# Patient Record
Sex: Female | Born: 1935
Health system: Southern US, Community
[De-identification: ages and names within clinical notes are randomized; demographics above are authoritative.]

## PROBLEM LIST (undated history)

## (undated) DIAGNOSIS — R011 Cardiac murmur, unspecified: Secondary | ICD-10-CM

## (undated) DIAGNOSIS — K5792 Diverticulitis of intestine, part unspecified, without perforation or abscess without bleeding: Secondary | ICD-10-CM

## (undated) DIAGNOSIS — H409 Unspecified glaucoma: Secondary | ICD-10-CM

## (undated) DIAGNOSIS — M810 Age-related osteoporosis without current pathological fracture: Secondary | ICD-10-CM

## (undated) DIAGNOSIS — I10 Essential (primary) hypertension: Secondary | ICD-10-CM

## (undated) DIAGNOSIS — E559 Vitamin D deficiency, unspecified: Secondary | ICD-10-CM

## (undated) DIAGNOSIS — M1990 Unspecified osteoarthritis, unspecified site: Secondary | ICD-10-CM

## (undated) HISTORY — DX: Unspecified osteoarthritis, unspecified site: M19.90

## (undated) HISTORY — DX: Vitamin D deficiency, unspecified: E55.9

## (undated) HISTORY — DX: Essential (primary) hypertension: I10

## (undated) HISTORY — DX: Cardiac murmur, unspecified: R01.1

## (undated) HISTORY — DX: Diverticulitis of intestine, part unspecified, without perforation or abscess without bleeding: K57.92

## (undated) HISTORY — DX: Age-related osteoporosis without current pathological fracture: M81.0

## (undated) HISTORY — DX: Unspecified glaucoma: H40.9

---
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
## 2006-04-29 HISTORY — PX: ABDOMINAL HYSTERECTOMY: SHX81

## 2009-04-29 HISTORY — PX: OTHER SURGICAL HISTORY: SHX169

## 2011-05-17 DIAGNOSIS — I10 Essential (primary) hypertension: Secondary | ICD-10-CM | POA: Diagnosis not present

## 2011-05-30 DIAGNOSIS — H40019 Open angle with borderline findings, low risk, unspecified eye: Secondary | ICD-10-CM | POA: Diagnosis not present

## 2011-08-19 DIAGNOSIS — M1710 Unilateral primary osteoarthritis, unspecified knee: Secondary | ICD-10-CM | POA: Diagnosis not present

## 2011-09-19 DIAGNOSIS — H40019 Open angle with borderline findings, low risk, unspecified eye: Secondary | ICD-10-CM | POA: Diagnosis not present

## 2011-09-25 DIAGNOSIS — R922 Inconclusive mammogram: Secondary | ICD-10-CM | POA: Diagnosis not present

## 2011-09-25 DIAGNOSIS — Z1231 Encounter for screening mammogram for malignant neoplasm of breast: Secondary | ICD-10-CM | POA: Diagnosis not present

## 2011-10-09 DIAGNOSIS — I059 Rheumatic mitral valve disease, unspecified: Secondary | ICD-10-CM | POA: Diagnosis not present

## 2011-10-09 DIAGNOSIS — E7800 Pure hypercholesterolemia, unspecified: Secondary | ICD-10-CM | POA: Diagnosis not present

## 2011-10-09 DIAGNOSIS — I10 Essential (primary) hypertension: Secondary | ICD-10-CM | POA: Diagnosis not present

## 2011-10-09 DIAGNOSIS — Z0000 Encounter for general adult medical examination without abnormal findings: Secondary | ICD-10-CM | POA: Diagnosis not present

## 2012-01-13 DIAGNOSIS — H40019 Open angle with borderline findings, low risk, unspecified eye: Secondary | ICD-10-CM | POA: Diagnosis not present

## 2012-03-09 DIAGNOSIS — Z23 Encounter for immunization: Secondary | ICD-10-CM | POA: Diagnosis not present

## 2012-03-09 DIAGNOSIS — I10 Essential (primary) hypertension: Secondary | ICD-10-CM | POA: Diagnosis not present

## 2012-05-13 DIAGNOSIS — H409 Unspecified glaucoma: Secondary | ICD-10-CM | POA: Diagnosis not present

## 2012-05-13 DIAGNOSIS — H4011X0 Primary open-angle glaucoma, stage unspecified: Secondary | ICD-10-CM | POA: Diagnosis not present

## 2012-07-03 DIAGNOSIS — I8000 Phlebitis and thrombophlebitis of superficial vessels of unspecified lower extremity: Secondary | ICD-10-CM | POA: Diagnosis not present

## 2012-07-08 DIAGNOSIS — Z1283 Encounter for screening for malignant neoplasm of skin: Secondary | ICD-10-CM | POA: Diagnosis not present

## 2012-07-08 DIAGNOSIS — D485 Neoplasm of uncertain behavior of skin: Secondary | ICD-10-CM | POA: Diagnosis not present

## 2012-07-08 DIAGNOSIS — D239 Other benign neoplasm of skin, unspecified: Secondary | ICD-10-CM | POA: Diagnosis not present

## 2012-07-08 DIAGNOSIS — D2360 Other benign neoplasm of skin of unspecified upper limb, including shoulder: Secondary | ICD-10-CM | POA: Diagnosis not present

## 2012-08-21 DIAGNOSIS — H40019 Open angle with borderline findings, low risk, unspecified eye: Secondary | ICD-10-CM | POA: Diagnosis not present

## 2012-09-07 DIAGNOSIS — H905 Unspecified sensorineural hearing loss: Secondary | ICD-10-CM | POA: Diagnosis not present

## 2012-09-23 DIAGNOSIS — H905 Unspecified sensorineural hearing loss: Secondary | ICD-10-CM | POA: Diagnosis not present

## 2012-12-23 ENCOUNTER — Encounter: Payer: Self-pay | Admitting: Family Medicine

## 2012-12-23 ENCOUNTER — Ambulatory Visit (INDEPENDENT_AMBULATORY_CARE_PROVIDER_SITE_OTHER): Payer: Medicare Other | Admitting: Family Medicine

## 2012-12-23 VITALS — BP 138/84 | HR 68 | Temp 98.5°F | Ht 70.0 in | Wt 133.0 lb

## 2012-12-23 DIAGNOSIS — Z1239 Encounter for other screening for malignant neoplasm of breast: Secondary | ICD-10-CM

## 2012-12-23 DIAGNOSIS — K5732 Diverticulitis of large intestine without perforation or abscess without bleeding: Secondary | ICD-10-CM | POA: Diagnosis not present

## 2012-12-23 DIAGNOSIS — H409 Unspecified glaucoma: Secondary | ICD-10-CM

## 2012-12-23 DIAGNOSIS — K5792 Diverticulitis of intestine, part unspecified, without perforation or abscess without bleeding: Secondary | ICD-10-CM

## 2012-12-23 DIAGNOSIS — I10 Essential (primary) hypertension: Secondary | ICD-10-CM | POA: Diagnosis not present

## 2012-12-23 DIAGNOSIS — M1990 Unspecified osteoarthritis, unspecified site: Secondary | ICD-10-CM

## 2012-12-23 DIAGNOSIS — M129 Arthropathy, unspecified: Secondary | ICD-10-CM

## 2012-12-23 MED ORDER — ATENOLOL 25 MG PO TABS: 25.0000 mg | ORAL_TABLET | Freq: Every day | ORAL | Status: DC

## 2012-12-23 NOTE — Progress Notes (Signed)
Nature conservation officer at [facility] [address] [place] [address] Phone: [phone] Fax: [phone]  Date:  12/23/2012   Name:  Anne Ferrell   DOB:  Jun 05, 1932   MRN:  [id number] Gender: female Age: 79 y.o.  Primary Physician:  Hannah Beat, MD  Evaluating MD: Hannah Beat, MD   Chief Complaint: Establish Care   History of Present Illness:  Anne Ferrell is a 79 y.o. pleasant patient who presents with the following:  New patient:   HTN: 120's - last reading was normal. Initially elevated today in the office. She feels fine otherwise  Also has glaucoma O/w generally healthy  Never had colon.   Patient Active Problem List   Diagnosis Date Noted  . High blood pressure   . Glaucoma   . Diverticulitis   . Arthritis     Past Medical History  Diagnosis Date  . Arthritis   . Diverticulitis   . Glaucoma   . Heart murmur   . High blood pressure     Past Surgical History  Procedure Laterality Date  . Cataract surgery    . Tonsillectomy and adenoidectomy    . Abdominal hysterectomy      History   Social History  . Marital Status: Widowed    Spouse Name: N/A    Number of Children: N/A  . Years of Education: N/A   Occupational History  . Not on file.   Social History Main Topics  . Smoking status: Never Smoker   . Smokeless tobacco: Never Used  . Alcohol Use: Yes     Comment: red wine  . Drug Use: No  . Sexual Activity: Not on file   Other Topics Concern  . Not on file   Social History Narrative   Mother of Kristie Cowman    Family History  Problem Relation Age of Onset  . Cancer Father     prostate    No Known Allergies  No current outpatient prescriptions on file prior to visit.   No current facility-administered medications on file prior to visit.     Review of Systems:   GEN: No acute illnesses, no fevers, chills. GI: No n/v/d, eating normally Pulm: No SOB Interactive and getting along well at  home.  Otherwise, ROS is as per the HPI.   Physical Examination: BP 178/90  Pulse 68  Temp(Src) 98.5 F (36.9 C) (Oral)  Ht 5\' 10"  (1.778 m)  Wt 133 lb (60.328 kg)  BMI 19.08 kg/m2  Ideal Body Weight: Weight in (lb) to have BMI = 25: 173.9   GEN: WDWN, NAD, Non-toxic, A & O x 3 HEENT: Atraumatic, Normocephalic. Neck supple. No masses, No LAD. Ears and Nose: No external deformity. CV: RRR, No M/G/R. No JVD. No thrill. No extra heart sounds. PULM: CTA B, no wheezes, crackles, rhonchi. No retractions. No resp. distress. No accessory muscle use. EXTR: No c/c/e NEURO Normal gait.  PSYCH: Normally interactive. Conversant. Not depressed or anxious appearing.  Calm demeanor.    Assessment and Plan:  Screening for malignant neoplasm of breast - Plan: MM Digital Screening  High blood pressure  Glaucoma  Diverticulitis  Arthritis  Doing well, cpx later in the year  Orders Today:  Orders Placed This Encounter  Procedures  . MM Digital Screening    Standing Status: Future     Number of Occurrences:      Standing Expiration Date: 02/22/2014    Order Specific Question:  Preferred imaging location?  Answer:  External    Order Specific Question:  Reason for exam:    Answer:  [place], screening    Updated Medication List: (Includes new medications, updates to list, dose adjustments) Meds ordered this encounter  Medications  . DISCONTD: atenolol (TENORMIN) 25 MG tablet    Sig: Take 25 mg by mouth daily.  . timolol (BETIMOL) 0.5 % ophthalmic solution    Sig: Place 1 drop into the right eye daily.  . Calcium-Vitamin D-Vitamin K (VIACTIV PO)    Sig: Take by mouth daily.  . cholecalciferol (VITAMIN D) 400 UNITS TABS tablet    Sig: Take by mouth daily.  . magnesium oxide (MAG-OX) 400 MG tablet    Sig: Take 400 mg by mouth daily.  Marland Kitchen atenolol (TENORMIN) 25 MG tablet    Sig: Take 1 tablet (25 mg total) by mouth daily.    Dispense:  90 tablet    Refill:  3    Medications  Discontinued: Medications Discontinued During This Encounter  Medication Reason  . atenolol (TENORMIN) 25 MG tablet Reorder      Signed, Jeromiah Ohalloran T. Juwann Sherk, MD 12/23/2012 10:55 AM

## 2012-12-29 ENCOUNTER — Encounter: Payer: Self-pay | Admitting: Family Medicine

## 2012-12-29 DIAGNOSIS — H409 Unspecified glaucoma: Secondary | ICD-10-CM | POA: Insufficient documentation

## 2012-12-29 DIAGNOSIS — M1990 Unspecified osteoarthritis, unspecified site: Secondary | ICD-10-CM | POA: Insufficient documentation

## 2012-12-29 DIAGNOSIS — I10 Essential (primary) hypertension: Secondary | ICD-10-CM | POA: Insufficient documentation

## 2012-12-29 DIAGNOSIS — K5792 Diverticulitis of intestine, part unspecified, without perforation or abscess without bleeding: Secondary | ICD-10-CM | POA: Insufficient documentation

## 2013-01-08 ENCOUNTER — Encounter: Payer: Self-pay | Admitting: Family Medicine

## 2013-01-08 ENCOUNTER — Ambulatory Visit: Payer: Self-pay | Admitting: Family Medicine

## 2013-01-08 DIAGNOSIS — Z1231 Encounter for screening mammogram for malignant neoplasm of breast: Secondary | ICD-10-CM | POA: Diagnosis not present

## 2013-01-25 DIAGNOSIS — H4010X0 Unspecified open-angle glaucoma, stage unspecified: Secondary | ICD-10-CM | POA: Diagnosis not present

## 2013-03-01 DIAGNOSIS — H4010X0 Unspecified open-angle glaucoma, stage unspecified: Secondary | ICD-10-CM | POA: Diagnosis not present

## 2013-04-06 ENCOUNTER — Other Ambulatory Visit: Payer: Self-pay | Admitting: Family Medicine

## 2013-04-06 DIAGNOSIS — R5381 Other malaise: Secondary | ICD-10-CM

## 2013-04-06 DIAGNOSIS — Z13220 Encounter for screening for lipoid disorders: Secondary | ICD-10-CM

## 2013-04-06 DIAGNOSIS — Z79899 Other long term (current) drug therapy: Secondary | ICD-10-CM

## 2013-04-07 ENCOUNTER — Other Ambulatory Visit (INDEPENDENT_AMBULATORY_CARE_PROVIDER_SITE_OTHER): Payer: Medicare Other

## 2013-04-07 DIAGNOSIS — R5381 Other malaise: Secondary | ICD-10-CM

## 2013-04-07 DIAGNOSIS — Z79899 Other long term (current) drug therapy: Secondary | ICD-10-CM

## 2013-04-07 DIAGNOSIS — Z13220 Encounter for screening for lipoid disorders: Secondary | ICD-10-CM

## 2013-04-07 LAB — CBC WITH DIFFERENTIAL/PLATELET
Basophils Relative: 0.9 % (ref 0.0–3.0)
Eosinophils Relative: 2.3 % (ref 0.0–5.0)
HCT: 43.1 % (ref 36.0–46.0)
Hemoglobin: 14.5 g/dL (ref 12.0–15.0)
Lymphocytes Relative: 14.2 % (ref 12.0–46.0)
Lymphs Abs: 1 10*3/uL (ref 0.7–4.0)
Monocytes Absolute: 0.5 10*3/uL (ref 0.1–1.0)
Monocytes Relative: 7.9 % (ref 3.0–12.0)
Neutro Abs: 5.1 10*3/uL (ref 1.4–7.7)
Platelets: 286 10*3/uL (ref 150.0–400.0)
RBC: 4.58 Mil/uL (ref 3.87–5.11)
WBC: 6.8 10*3/uL (ref 4.5–10.5)

## 2013-04-07 LAB — HEPATIC FUNCTION PANEL
ALT: 22 U/L (ref 0–35)
AST: 28 U/L (ref 0–37)
Albumin: 4.1 g/dL (ref 3.5–5.2)
Alkaline Phosphatase: 63 U/L (ref 39–117)
Bilirubin, Direct: 0.1 mg/dL (ref 0.0–0.3)
Total Protein: 6.6 g/dL (ref 6.0–8.3)

## 2013-04-07 LAB — LIPID PANEL
Total CHOL/HDL Ratio: 3
Triglycerides: 72 mg/dL (ref 0.0–149.0)

## 2013-04-07 LAB — BASIC METABOLIC PANEL
BUN: 18 mg/dL (ref 6–23)
CO2: 28 mEq/L (ref 19–32)
Calcium: 9.1 mg/dL (ref 8.4–10.5)
Chloride: 104 mEq/L (ref 96–112)
Creatinine, Ser: 0.9 mg/dL (ref 0.4–1.2)
GFR: 63.42 mL/min (ref >60.00)

## 2013-04-07 LAB — TSH: TSH: 1.59 u[IU]/mL (ref 0.35–5.50)

## 2013-04-12 ENCOUNTER — Ambulatory Visit (INDEPENDENT_AMBULATORY_CARE_PROVIDER_SITE_OTHER): Payer: Medicare Other | Admitting: Family Medicine

## 2013-04-12 ENCOUNTER — Encounter: Payer: Self-pay | Admitting: Family Medicine

## 2013-04-12 ENCOUNTER — Encounter: Payer: Self-pay | Admitting: Internal Medicine

## 2013-04-12 VITALS — BP 150/84 | HR 60 | Temp 97.9°F | Ht 70.0 in | Wt 136.5 lb

## 2013-04-12 DIAGNOSIS — Z23 Encounter for immunization: Secondary | ICD-10-CM

## 2013-04-12 DIAGNOSIS — Z1211 Encounter for screening for malignant neoplasm of colon: Secondary | ICD-10-CM

## 2013-04-12 DIAGNOSIS — Z0000 Encounter for general adult medical examination without abnormal findings: Secondary | ICD-10-CM

## 2013-04-12 NOTE — Progress Notes (Signed)
Pre-visit discussion using our clinic review tool. No additional management support is needed unless otherwise documented below in the visit note.  

## 2013-04-12 NOTE — Progress Notes (Signed)
Date:  04/12/2013   Name:  Anne Ferrell   DOB:  1932-12-02   MRN:  [id number] Gender: female Age: 79 y.o.  Primary Physician:  Hannah Beat, MD   Chief Complaint: Medicare Wellness   Subjective:   History of Present Illness:  Anne Ferrell is a 79 y.o. pleasant patient who presents with the following:  Health Maintenance Summary Reviewed and updated, unless pt declines services.  Tobacco History Reviewed. Non-smoker Alcohol: No concerns, no excessive use Exercise Habits: Some activity, rec at least 30 mins 5 times a week STD concerns: none Drug Use: None Birth control method: n/a Menses regular: hyst Lumps or breast concerns: no  Health Maintenance  Topic Date Due  . Colonoscopy  06/30/1984  . Tetanus/tdap  04/13/2014  . Zostavax  04/13/2018  . Influenza Vaccine  11/27/2013  . Mammogram  01/08/2014  . Pneumococcal Polysaccharide Vaccine Age 36 And Over  Completed   Immunization History  Administered Date(s) Administered  . Influenza,inj,Quad PF,36+ Mos 04/12/2013  . Pneumococcal Polysaccharide-23 04/12/2013    Patient Active Problem List   Diagnosis Date Noted  . High blood pressure   . Glaucoma   . Diverticulitis   . Arthritis     Past Medical History  Diagnosis Date  . Arthritis   . Diverticulitis   . Glaucoma   . Heart murmur   . High blood pressure     Past Surgical History  Procedure Laterality Date  . Cataract surgery    . Tonsillectomy and adenoidectomy    . Abdominal hysterectomy      History   Social History  . Marital Status: Widowed    Spouse Name: N/A    Number of Children: N/A  . Years of Education: N/A   Occupational History  . Not on file.   Social History Main Topics  . Smoking status: Never Smoker   . Smokeless tobacco: Never Used  . Alcohol Use: Yes     Comment: red wine  . Drug Use: No  . Sexual Activity: Not on file   Other Topics Concern  . Not on file   Social History Narrative   Mother of  Anne Ferrell    Family History  Problem Relation Age of Onset  . Cancer Father     prostate    Allergies  Allergen Reactions  . Anise Flavor Other (See Comments)    Feels like she is going to pass out  . Licorice Flavor [Flavoring Agent] Palpitations    Medication list has been reviewed and updated.  Review of Systems:   General: Denies fever, chills, sweats. No significant weight loss. Eyes: Denies blurring,significant itching ENT: Denies earache, sore throat, and hoarseness.  Cardiovascular: Denies chest pains, palpitations, dyspnea on exertion,  Respiratory: Denies cough, dyspnea at rest,wheeezing Breast: no concerns about lumps GI: Denies nausea, vomiting, diarrhea, constipation, change in bowel habits, abdominal pain, melena, hematochezia GU: Denies dysuria, hematuria, urinary hesitancy, nocturia, denies STD risk, no concerns about discharge Musculoskeletal: Denies back pain, joint pain Derm: Denies rash, itching Neuro: Denies  paresthesias, frequent falls, frequent headaches Psych: Denies depression, anxiety Endocrine: Denies cold intolerance, heat intolerance, polydipsia Heme: Denies enlarged lymph nodes Allergy: No hayfever  Objective:   Physical Examination: BP 150/84  Pulse 60  Temp(Src) 97.9 F (36.6 C) (Oral)  Ht 5\' 10"  (1.778 m)  Wt 136 lb 8 oz (61.916 kg)  BMI 19.59 kg/m2  Ideal Body Weight: Weight in (lb) to have BMI = 25: 173.9  GEN: well  developed, well nourished, no acute distress Eyes: conjunctiva and lids normal, PERRLA, EOMI ENT: TM clear, nares clear, oral exam WNL Neck: supple, no lymphadenopathy, no thyromegaly, no JVD Pulm: clear to auscultation and percussion, respiratory effort normal CV: regular rate and rhythm, S1-S2, no murmur, rub or gallop, no bruits Chest: no scars, masses, no lumps BREAST: breast exam normal. Chaperoned examination. Entirety of breast examined including axilla, and no enlarged lymph nodes. No significant  masses are felt. No nipple discharge. No skin changes. Overall, reassuring breast exam. GI: soft, non-tender; no hepatosplenomegaly, masses; active bowel sounds all quadrants GU: GU exam declined Lymph: no cervical, axillary or inguinal adenopathy MSK: gait normal, muscle tone and strength WNL, no joint swelling, effusions, discoloration, crepitus  SKIN: clear, good turgor, color WNL, no rashes, lesions, or ulcerations Neuro: normal mental status, normal strength, sensation, and motion Psych: alert; oriented to person, place and time, normally interactive and not anxious or depressed in appearance.   All labs reviewed with patient. Lipids:    Component Value Date/Time   CHOL 252* 04/07/2013 0938   TRIG 72.0 04/07/2013 0938   HDL 79.10 04/07/2013 0938   LDLDIRECT 166.4 04/07/2013 0938   VLDL 14.4 04/07/2013 0938   CHOLHDL 3 04/07/2013 0938    CBC:    Component Value Date/Time   WBC 6.8 04/07/2013 0938   HGB 14.5 04/07/2013 0938   HCT 43.1 04/07/2013 0938   PLT 286.0 04/07/2013 0938   MCV 94.1 04/07/2013 0938   NEUTROABS 5.1 04/07/2013 0938   LYMPHSABS 1.0 04/07/2013 0938   MONOABS 0.5 04/07/2013 0938   EOSABS 0.2 04/07/2013 0938   BASOSABS 0.1 04/07/2013 0938    Basic Metabolic Panel:    Component Value Date/Time   NA 141 04/07/2013 0938   K 4.4 04/07/2013 0938   CL 104 04/07/2013 0938   CO2 28 04/07/2013 0938   BUN 18 04/07/2013 0938   CREATININE 0.9 04/07/2013 0938   GLUCOSE 110* 04/07/2013 0938   CALCIUM 9.1 04/07/2013 0938    Lab Results  Component Value Date   ALT 22 04/07/2013   AST 28 04/07/2013   ALKPHOS 63 04/07/2013   BILITOT 0.9 04/07/2013    Lab Results  Component Value Date   TSH 1.59 04/07/2013     No results found.  Assessment & Plan:   Health Maintenance Exam: The patient's preventative maintenance and recommended screening tests for an annual wellness exam were reviewed in full today. Brought up to date unless services  declined.  Counselled on the importance of diet, exercise, and its role in overall health and mortality. The patient's FH and SH was reviewed, including their home life, tobacco status, and drug and alcohol status.   Routine general medical examination at a health care facility  Need for prophylactic vaccination and inoculation against influenza - Plan: Flu Vaccine QUAD 36+ mos PF IM (Fluarix)  Need for prophylactic vaccination against Streptococcus pneumoniae (pneumococcus) - Plan: Pneumococcal polysaccharide vaccine 23-valent greater than or equal to 2yo subcutaneous/IM  Special screening for malignant neoplasms, colon - Plan: Ambulatory referral to Gastroenterology  I have personally reviewed the Medicare Annual Wellness questionnaire and have noted 1. The patient's medical and social history 2. Their use of alcohol, tobacco or illicit drugs 3. Their current medications and supplements 4. The patient's functional ability including ADL's, fall risks, home safety risks and hearing or visual             impairment. 5. Diet and physical activities 6. Evidence for  depression or mood disorders  The patients weight, height, BMI and visual acuity have been recorded in the chart I have made referrals, counseling and provided education to the patient based review of the above and I have provided the pt with a written personalized care plan for preventive services.  I have provided the patient with a copy of your personalized plan for preventive services. Instructed to take the time to review along with their updated medication list.   Colonoscopy referral  Orders Today:  Orders Placed This Encounter  Procedures  . Flu Vaccine QUAD 36+ mos PF IM (Fluarix)  . Pneumococcal polysaccharide vaccine 23-valent greater than or equal to 2yo subcutaneous/IM  . Ambulatory referral to Gastroenterology    New medications, updates to list, dose adjustments: No orders of the defined types were placed  in this encounter.    Signed,  Elpidio Galea. Adelee Hannula, MD, CAQ Sports Medicine  [facility] at [facility] [address] [place] [address] Phone: [phone] Fax: [phone]  Updated Complete Medication List:   Medication List       This list is accurate as of: 04/12/13 11:59 PM.  Always use your most recent med list.               atenolol 25 MG tablet  Commonly known as:  TENORMIN  Take 1 tablet (25 mg total) by mouth daily.     cholecalciferol 400 UNITS Tabs tablet  Commonly known as:  VITAMIN D  Take by mouth daily.     magnesium oxide 400 MG tablet  Commonly known as:  MAG-OX  Take 400 mg by mouth daily.     timolol 0.5 % ophthalmic solution  Commonly known as:  BETIMOL  Place 1 drop into the right eye daily.     VIACTIV PO  Take by mouth daily.

## 2013-06-14 DIAGNOSIS — M1710 Unilateral primary osteoarthritis, unspecified knee: Secondary | ICD-10-CM | POA: Diagnosis not present

## 2013-06-14 DIAGNOSIS — M25669 Stiffness of unspecified knee, not elsewhere classified: Secondary | ICD-10-CM | POA: Diagnosis not present

## 2013-06-14 DIAGNOSIS — M2390 Unspecified internal derangement of unspecified knee: Secondary | ICD-10-CM | POA: Diagnosis not present

## 2013-06-14 DIAGNOSIS — M25569 Pain in unspecified knee: Secondary | ICD-10-CM | POA: Diagnosis not present

## 2013-06-16 ENCOUNTER — Encounter: Payer: Medicare Other | Admitting: Internal Medicine

## 2013-07-02 DIAGNOSIS — M25569 Pain in unspecified knee: Secondary | ICD-10-CM | POA: Diagnosis not present

## 2013-07-06 DIAGNOSIS — M2390 Unspecified internal derangement of unspecified knee: Secondary | ICD-10-CM | POA: Diagnosis not present

## 2013-07-06 DIAGNOSIS — M1710 Unilateral primary osteoarthritis, unspecified knee: Secondary | ICD-10-CM | POA: Diagnosis not present

## 2013-07-06 DIAGNOSIS — M25569 Pain in unspecified knee: Secondary | ICD-10-CM | POA: Diagnosis not present

## 2013-07-06 DIAGNOSIS — M25669 Stiffness of unspecified knee, not elsewhere classified: Secondary | ICD-10-CM | POA: Diagnosis not present

## 2013-07-08 DIAGNOSIS — M25569 Pain in unspecified knee: Secondary | ICD-10-CM | POA: Diagnosis not present

## 2013-07-09 ENCOUNTER — Ambulatory Visit (AMBULATORY_SURGERY_CENTER): Payer: Self-pay | Admitting: *Deleted

## 2013-07-09 VITALS — Ht 70.0 in | Wt 136.2 lb

## 2013-07-09 DIAGNOSIS — Z1211 Encounter for screening for malignant neoplasm of colon: Secondary | ICD-10-CM

## 2013-07-09 MED ORDER — MOVIPREP 100 G PO SOLR: ORAL | Status: DC

## 2013-07-09 NOTE — Progress Notes (Signed)
No allergies to eggs or soy. No problems with anesthesia.  

## 2013-07-23 ENCOUNTER — Encounter: Payer: Self-pay | Admitting: Internal Medicine

## 2013-07-23 ENCOUNTER — Ambulatory Visit (AMBULATORY_SURGERY_CENTER): Payer: Medicare Other | Admitting: Internal Medicine

## 2013-07-23 VITALS — BP 131/92 | HR 56 | Temp 97.6°F | Resp 30 | Ht 70.0 in | Wt 136.0 lb

## 2013-07-23 DIAGNOSIS — Z1211 Encounter for screening for malignant neoplasm of colon: Secondary | ICD-10-CM

## 2013-07-23 DIAGNOSIS — K5732 Diverticulitis of large intestine without perforation or abscess without bleeding: Secondary | ICD-10-CM

## 2013-07-23 DIAGNOSIS — I10 Essential (primary) hypertension: Secondary | ICD-10-CM | POA: Diagnosis not present

## 2013-07-23 MED ORDER — CEPHALEXIN 250 MG PO CAPS: 250.0000 mg | ORAL_CAPSULE | Freq: Four times a day (QID) | ORAL | Status: DC

## 2013-07-23 MED ORDER — SODIUM CHLORIDE 0.9 % IV SOLN: 500.0000 mL | INTRAVENOUS | Status: DC

## 2013-07-23 NOTE — Progress Notes (Signed)
Procedure ends, to recovery, report given and VSS. 

## 2013-07-23 NOTE — Patient Instructions (Signed)
YOU HAD AN ENDOSCOPIC PROCEDURE TODAY AT THE [facility]: Refer to the procedure report that was given to you for any specific questions about what was found during the examination.  If the procedure report does not answer your questions, please call your gastroenterologist to clarify.  If you requested that your care partner not be given the details of your procedure findings, then the procedure report has been included in a sealed envelope for you to review at your convenience later.  YOU SHOULD EXPECT: Some feelings of bloating in the abdomen. Passage of more gas than usual.  Walking can help get rid of the air that was put into your GI tract during the procedure and reduce the bloating. If you had a lower endoscopy (such as a colonoscopy or flexible sigmoidoscopy) you may notice spotting of blood in your stool or on the toilet paper. If you underwent a bowel prep for your procedure, then you may not have a normal bowel movement for a few days.  DIET: Your first meal following the procedure should be a light meal and then it is ok to progress to your normal diet.  A half-sandwich or bowl of soup is an example of a good first meal.  Heavy or fried foods are harder to digest and may make you feel nauseous or bloated.  Likewise meals heavy in dairy and vegetables can cause extra gas to form and this can also increase the bloating.  Drink plenty of fluids but you should avoid alcoholic beverages for 24 hours.  ACTIVITY: Your care partner should take you home directly after the procedure.  You should plan to take it easy, moving slowly for the rest of the day.  You can resume normal activity the day after the procedure however you should NOT DRIVE or use heavy machinery for 24 hours (because of the sedation medicines used during the test).    SYMPTOMS TO REPORT IMMEDIATELY: A gastroenterologist can be reached at any hour.  During normal business hours, 8:30 AM to 5:00 PM Monday through Friday,  call [phone].  After hours and on weekends, please call the GI answering service at [phone] who will take a message and have the physician on call contact you.   Following lower endoscopy (colonoscopy or flexible sigmoidoscopy):  Excessive amounts of blood in the stool  Significant tenderness or worsening of abdominal pains  Swelling of the abdomen that is new, acute  Fever of 100F or higher    FOLLOW UP: If any biopsies were taken you will be contacted by phone or by letter within the next 1-3 weeks.  Call your gastroenterologist if you have not heard about the biopsies in 3 weeks.  Our staff will call the home number listed on your records the next business day following your procedure to check on you and address any questions or concerns that you may have at that time regarding the information given to you following your procedure. This is a courtesy call and so if there is no answer at the home number and we have not heard from you through the emergency physician on call, we will assume that you have returned to your regular daily activities without incident.  SIGNATURES/CONFIDENTIALITY: You and/or your care partner have signed paperwork which will be entered into your electronic medical record.  These signatures attest to the fact that that the information above on your After Visit Summary has been reviewed and is understood.  Full responsibility of the confidentiality   of this discharge information lies with you and/or your care-partner.   Information on diverticulosis and high fiber diet given to you today  Dr. Olevia Perches recommends Metamucil or Benefiber today

## 2013-07-23 NOTE — Op Note (Signed)
[place]  [address]. [place], [address]   COLONOSCOPY PROCEDURE REPORT  PATIENT: Anne Ferrell, Anne Ferrell  MR#: [id number] BIRTHDATE: 11-15-1932 , 81  yrs. old GENDER: Female ENDOSCOPIST: Lafayette Dragon, MD REFERRED [id number] Celedonio Savage, M.D. PROCEDURE DATE:  07/23/2013 PROCEDURE:   Colonoscopy, screening First Screening Colonoscopy - Avg.  risk and is 50 yrs.  old or older Yes.  Prior Negative Screening - Now for repeat screening. N/A  History of Adenoma - Now for follow-up colonoscopy & has been > or = to 3 yrs.  N/A  Polyps Removed Today? No.  Recommend repeat exam, <10 yrs? No. ASA CLASS:   Class II INDICATIONS:Average risk patient for colon cancer. MEDICATIONS: MAC sedation, administered by CRNA and Propofol (Diprivan) 230 mg IV  DESCRIPTION OF PROCEDURE:   After the risks benefits and alternatives of the procedure were thoroughly explained, informed consent was obtained.  A digital rectal exam revealed no abnormalities of the rectum.   The LB [id number]  endoscope was introduced through the anus and advanced to the cecum, which was identified by both the appendix and ileocecal valve. No adverse events experienced.   The quality of the prep was good, using MoviPrep  The instrument was then slowly withdrawn as the colon was fully examined.      COLON FINDINGS: There was severe diverticulosis noted throughout the entire examined colon with associated angulation, tortuosity and muscular hypertrophy.  Retroflexed views revealed no abnormalities. The time to cecum=14 minutes 00 seconds.  Withdrawal time=6 minutes 06 seconds.  The scope was withdrawn and the procedure completed. COMPLICATIONS: There were no complications.  ENDOSCOPIC IMPRESSION: There was severe diverticulosis noted throughout the entire examined colon tortuous colon throughout  RECOMMENDATIONS: high fiber diet Fiber supplements daily no recall due to age   eSigned:  Lafayette Dragon, MD 07/23/2013 10:16 AM   cc:   PATIENT NAME:  Huda, Petrey MR#: [id number]

## 2013-07-26 ENCOUNTER — Telehealth: Payer: Self-pay | Admitting: *Deleted

## 2013-07-26 NOTE — Telephone Encounter (Signed)
  Follow up Call-  Call back number 07/23/2013  Post procedure Call Back phone  # [phone]  Permission to leave phone message Yes     Patient questions:  Do you have a fever, pain , or abdominal swelling? no Pain Score  0 *  Have you tolerated food without any problems? yes  Have you been able to return to your normal activities? yes  Do you have any questions about your discharge instructions: Diet   no Medications  no Follow up visit  no  Do you have questions or concerns about your Care? no  Actions: * If pain score is 4 or above: No action needed, pain <4.

## 2013-08-30 DIAGNOSIS — H4010X0 Unspecified open-angle glaucoma, stage unspecified: Secondary | ICD-10-CM | POA: Diagnosis not present

## 2013-09-30 DIAGNOSIS — H4010X0 Unspecified open-angle glaucoma, stage unspecified: Secondary | ICD-10-CM | POA: Diagnosis not present

## 2013-11-05 DIAGNOSIS — H4010X0 Unspecified open-angle glaucoma, stage unspecified: Secondary | ICD-10-CM | POA: Diagnosis not present

## 2013-12-14 ENCOUNTER — Other Ambulatory Visit: Payer: Self-pay | Admitting: Family Medicine

## 2014-01-28 DIAGNOSIS — H4011X1 Primary open-angle glaucoma, mild stage: Secondary | ICD-10-CM | POA: Diagnosis not present

## 2014-03-10 ENCOUNTER — Ambulatory Visit (INDEPENDENT_AMBULATORY_CARE_PROVIDER_SITE_OTHER): Payer: Medicare Other

## 2014-03-10 ENCOUNTER — Other Ambulatory Visit: Payer: Self-pay

## 2014-03-10 DIAGNOSIS — Z23 Encounter for immunization: Secondary | ICD-10-CM

## 2014-03-10 MED ORDER — ATENOLOL 25 MG PO TABS: ORAL_TABLET | ORAL | Status: DC

## 2014-03-10 NOTE — Telephone Encounter (Signed)
Pt left note requesting refill atenolol to [facility] [address]; pt last seen 04/12/13 and has CPX scheduled 07/06/14.Please advise.

## 2014-03-16 DIAGNOSIS — H4011X1 Primary open-angle glaucoma, mild stage: Secondary | ICD-10-CM | POA: Diagnosis not present

## 2014-05-13 DIAGNOSIS — H4011X1 Primary open-angle glaucoma, mild stage: Secondary | ICD-10-CM | POA: Diagnosis not present

## 2014-05-26 DIAGNOSIS — H4011X1 Primary open-angle glaucoma, mild stage: Secondary | ICD-10-CM | POA: Diagnosis not present

## 2014-06-06 ENCOUNTER — Other Ambulatory Visit: Payer: Self-pay | Admitting: Family Medicine

## 2014-06-09 DIAGNOSIS — H4011X1 Primary open-angle glaucoma, mild stage: Secondary | ICD-10-CM | POA: Diagnosis not present

## 2014-06-16 ENCOUNTER — Ambulatory Visit: Payer: Self-pay | Admitting: Family Medicine

## 2014-06-16 DIAGNOSIS — Z1231 Encounter for screening mammogram for malignant neoplasm of breast: Secondary | ICD-10-CM | POA: Diagnosis not present

## 2014-06-17 ENCOUNTER — Encounter: Payer: Self-pay | Admitting: Family Medicine

## 2014-06-29 ENCOUNTER — Other Ambulatory Visit: Payer: Self-pay | Admitting: Family Medicine

## 2014-06-29 DIAGNOSIS — R5383 Other fatigue: Secondary | ICD-10-CM

## 2014-06-29 DIAGNOSIS — E785 Hyperlipidemia, unspecified: Secondary | ICD-10-CM

## 2014-07-01 ENCOUNTER — Other Ambulatory Visit (INDEPENDENT_AMBULATORY_CARE_PROVIDER_SITE_OTHER): Payer: Medicare Other

## 2014-07-01 DIAGNOSIS — E785 Hyperlipidemia, unspecified: Secondary | ICD-10-CM

## 2014-07-01 DIAGNOSIS — R5383 Other fatigue: Secondary | ICD-10-CM

## 2014-07-01 LAB — BASIC METABOLIC PANEL
BUN: 19 mg/dL (ref 6–23)
CHLORIDE: 105 meq/L (ref 96–112)
CO2: 29 meq/L (ref 19–32)
CREATININE: 0.78 mg/dL (ref 0.40–1.20)
Calcium: 9.4 mg/dL (ref 8.4–10.5)
GFR: 75.53 mL/min (ref >60.00)
GLUCOSE: 115 mg/dL — AB (ref 70–99)
Potassium: 4.1 mEq/L (ref 3.5–5.1)
Sodium: 139 mEq/L (ref 135–145)

## 2014-07-01 LAB — CBC WITH DIFFERENTIAL/PLATELET
BASOS ABS: 0.1 10*3/uL (ref 0.0–0.1)
BASOS PCT: 0.9 % (ref 0.0–3.0)
EOS ABS: 0.1 10*3/uL (ref 0.0–0.7)
Eosinophils Relative: 1.9 % (ref 0.0–5.0)
HCT: 42.7 % (ref 36.0–46.0)
Hemoglobin: 14.5 g/dL (ref 12.0–15.0)
LYMPHS PCT: 18.5 % (ref 12.0–46.0)
Lymphs Abs: 1 10*3/uL (ref 0.7–4.0)
MCHC: 34 g/dL (ref 30.0–36.0)
MCV: 91.9 fl (ref 78.0–100.0)
MONO ABS: 0.5 10*3/uL (ref 0.1–1.0)
Monocytes Relative: 8.2 % (ref 3.0–12.0)
NEUTROS PCT: 70.5 % (ref 43.0–77.0)
Neutro Abs: 3.9 10*3/uL (ref 1.4–7.7)
PLATELETS: 263 10*3/uL (ref 150.0–400.0)
RBC: 4.65 Mil/uL (ref 3.87–5.11)
RDW: 13 % (ref 11.5–15.5)
WBC: 5.6 10*3/uL (ref 4.0–10.5)

## 2014-07-01 LAB — LIPID PANEL
CHOL/HDL RATIO: 3
Cholesterol: 240 mg/dL — ABNORMAL HIGH (ref 0–200)
HDL: 91.3 mg/dL (ref >39.00)
LDL Cholesterol: 133 mg/dL — ABNORMAL HIGH (ref 0–99)
NonHDL: 148.7
Triglycerides: 79 mg/dL (ref 0.0–149.0)
VLDL: 15.8 mg/dL (ref 0.0–40.0)

## 2014-07-01 LAB — HEPATIC FUNCTION PANEL
ALT: 18 U/L (ref 0–35)
AST: 24 U/L (ref 0–37)
Albumin: 4.3 g/dL (ref 3.5–5.2)
Alkaline Phosphatase: 70 U/L (ref 39–117)
BILIRUBIN TOTAL: 0.8 mg/dL (ref 0.2–1.2)
Bilirubin, Direct: 0.1 mg/dL (ref 0.0–0.3)
Total Protein: 6.7 g/dL (ref 6.0–8.3)

## 2014-07-01 LAB — TSH: TSH: 1.9 u[IU]/mL (ref 0.35–4.50)

## 2014-07-06 ENCOUNTER — Encounter: Payer: Self-pay | Admitting: Family Medicine

## 2014-07-06 ENCOUNTER — Ambulatory Visit (INDEPENDENT_AMBULATORY_CARE_PROVIDER_SITE_OTHER): Payer: Medicare Other | Admitting: Family Medicine

## 2014-07-06 VITALS — BP 120/80 | HR 75 | Temp 98.3°F | Ht 70.0 in | Wt 137.5 lb

## 2014-07-06 DIAGNOSIS — Z0000 Encounter for general adult medical examination without abnormal findings: Secondary | ICD-10-CM | POA: Diagnosis not present

## 2014-07-06 NOTE — Progress Notes (Signed)
Dr. Frederico Hamman T. Meia Emley, MD, [facility] [place], [address] Phone: [phone] Fax: [phone]  07/06/2014  Patient: Anne Ferrell, MRN: [id number], DOB: 11-26-1932, 81 y.o.  Primary Physician:  Owens Loffler, MD  Chief Complaint: Annual Exam  Subjective:   Anne Ferrell is a 81 y.o. pleasant patient who presents for a medicare wellness examination:  Health Maintenance Summary Reviewed and updated, unless pt declines services.  Tobacco History Reviewed. Non-smoker Alcohol: No concerns, no excessive use Exercise Habits: Some activity, rec at least 30 mins 5 times a week - very active walker STD concerns: none Drug Use: None Birth control method: n/a Menses regular: n/a Lumps or breast concerns: no Breast Cancer Family History: no  Health Maintenance  Topic Date Due  . TETANUS/TDAP  06/30/1953  . DEXA SCAN  07/01/1999  . PNA vac Low Risk Adult (2 of 2 - PCV13) 04/12/2014  . ZOSTAVAX  04/13/2018 (Originally 07/01/1994)  . INFLUENZA VACCINE  11/28/2014  . MAMMOGRAM  06/17/2015  . COLONOSCOPY  07/24/2023   Immunization History  Administered Date(s) Administered  . Influenza,inj,Quad PF,36+ Mos 04/12/2013, 03/10/2014  . Pneumococcal Polysaccharide-23 04/12/2013    Patient Active Problem List   Diagnosis Date Noted  . High blood pressure   . Glaucoma   . Diverticulitis   . Arthritis    Past Medical History  Diagnosis Date  . Arthritis   . Diverticulitis   . Glaucoma   . Heart murmur   . High blood pressure    Past Surgical History  Procedure Laterality Date  . Cataract surgery Bilateral 2011  . Tonsillectomy and adenoidectomy  1941  . Abdominal hysterectomy  2008   History   Social History  . Marital Status: Widowed    Spouse Name: N/A  . Number of Children: N/A  . Years of Education: N/A   Occupational History  . Not on file.   Social History Main Topics  . Smoking status:  Never Smoker   . Smokeless tobacco: Never Used  . Alcohol Use: 6.0 oz/week    10 Glasses of wine per week  . Drug Use: No  . Sexual Activity: Not on file   Other Topics Concern  . Not on file   Social History Narrative   Mother of Anne Ferrell   Family History  Problem Relation Age of Onset  . Cancer Father     prostate  . Colon cancer Neg Hx    Allergies  Allergen Reactions  . Anise Flavor Other (See Comments)    Feels like she is going to pass out  . Licorice Flavor [Flavoring Agent] Palpitations    Medication list has been reviewed and updated.   General: Denies fever, chills, sweats. No significant weight loss. Eyes: Denies blurring,significant itching ENT: Denies earache, sore throat, and hoarseness.  Cardiovascular: Denies chest pains, palpitations, dyspnea on exertion,  Respiratory: Denies cough, dyspnea at rest,wheeezing Breast: no concerns about lumps GI: Denies nausea, vomiting, diarrhea, constipation, change in bowel habits, abdominal pain, melena, hematochezia GU: Denies dysuria, hematuria, urinary hesitancy, nocturia, denies STD risk, no concerns about discharge Musculoskeletal: Denies back pain, joint pain Derm: Denies rash, itching Neuro: Denies  paresthesias, frequent falls, frequent headaches Psych: Denies depression, anxiety Endocrine: Denies cold intolerance, heat intolerance, polydipsia Heme: Denies enlarged lymph nodes Allergy: No hayfever  Objective:   BP 120/80 mmHg  Pulse 75  Temp(Src) 98.3 F (36.8 C) (Oral)  Ht _0  (1.778 m)  Wt 137 lb 8 oz (62.37 kg)  BMI 19.73 kg/m2  The patient completed a fall screen and PHQ-2 and PHQ-9 if necessary, which is documented in the EHR. The CMA/LPN/RN who assisted the patient verbally completed with them and documented results in [facility] EHR.   Hearing Screening   Method: Audiometry   _0  _1  _2  _3  _4  _5  _6   Right ear:   40      Left ear:   40 40 40    Vision  Screening Comments: Wears Glasses-Eye Exam 06/09/2014 with Dr. Wallace Going at [facility]: well developed, well nourished, no acute distress Eyes: conjunctiva and lids normal, PERRLA, EOMI ENT: TM clear, nares clear, oral exam WNL Neck: supple, no lymphadenopathy, no thyromegaly, no JVD Pulm: clear to auscultation and percussion, respiratory effort normal CV: regular rate and rhythm, S1-S2, no murmur, rub or gallop, no bruits Chest: no scars, masses, no lumps BREAST: breast exam declined GI: soft, non-tender; no hepatosplenomegaly, masses; active bowel sounds all quadrants GU: GU exam declined Lymph: no cervical, axillary or inguinal adenopathy MSK: gait normal, muscle tone and strength WNL, no joint swelling, effusions, discoloration, crepitus  SKIN: clear, good turgor, color WNL, no rashes, lesions, or ulcerations Neuro: normal mental status, normal strength, sensation, and motion Psych: alert; oriented to person, place and time, normally interactive and not anxious or depressed in appearance.   All labs reviewed with patient. Lipids:    Component Value Date/Time   CHOL 240* 07/01/2014 0834   TRIG 79.0 07/01/2014 0834   HDL 91.30 07/01/2014 0834   LDLDIRECT 166.4 04/07/2013 0938   VLDL 15.8 07/01/2014 0834   CHOLHDL 3 07/01/2014 0834   CBC: CBC Latest Ref Rng 07/01/2014 04/07/2013  WBC 4.0 - 10.5 K/uL 5.6 6.8  Hemoglobin 12.0 - 15.0 g/dL 14.5 14.5  Hematocrit 36.0 - 46.0 % 42.7 43.1  Platelets 150.0 - 400.0 K/uL 263.0 811.9    Basic Metabolic Panel:    Component Value Date/Time   NA 139 07/01/2014 0834   K 4.1 07/01/2014 0834   CL 105 07/01/2014 0834   CO2 29 07/01/2014 0834   BUN 19 07/01/2014 0834   CREATININE 0.78 07/01/2014 0834   GLUCOSE 115* 07/01/2014 0834   CALCIUM 9.4 07/01/2014 0834   Hepatic Function Latest Ref Rng 07/01/2014 04/07/2013  Total Protein 6.0 - 8.3 g/dL 6.7 6.6  Albumin 3.5 - 5.2 g/dL 4.3 4.1  AST 0 - 37 U/L 24 28  ALT 0 - 35 U/L  18 22  Alk Phosphatase 39 - 117 U/L 70 63  Total Bilirubin 0.2 - 1.2 mg/dL 0.8 0.9  Bilirubin, Direct 0.0 - 0.3 mg/dL 0.1 0.1    Lab Results  Component Value Date   TSH 1.90 07/01/2014    No results found.  Assessment and Plan:   Routine general medical examination at a health care facility  Health Maintenance Exam: The patient's preventative maintenance and recommended screening tests for an annual wellness exam were reviewed in full today. Brought up to date unless services declined.  Counselled on the importance of diet, exercise, and its role in overall health and mortality. The patient's FH and SH was reviewed, including their home life, tobacco status, and drug and alcohol status.  I have personally reviewed the Medicare Annual Wellness questionnaire and have noted 1. The patient's medical and social history 2. Their use of alcohol, tobacco or illicit drugs 3. Their current medications and supplements 4. The patient's functional ability including ADL's,  fall risks, home safety risks and hearing or visual             impairment. 5. Diet and physical activities 6. Evidence for depression or mood disorders  The patients weight, height, BMI and visual acuity have been recorded in the chart I have made referrals, counseling and provided education to the patient based review of the above and I have provided the pt with a written personalized care plan for preventive services.  I have provided the patient with a copy of your personalized plan for preventive services. Instructed to take the time to review along with their updated medication list.  Overall, doing great.   Follow-up: Return in 1 year (on 07/06/2015). Or follow-up in 1 year for complete physical examination  New Prescriptions   No medications on file   No orders of the defined types were placed in this encounter.    Signed,  Maud Deed. Josslyn Ciolek, MD   Patient's Medications  New Prescriptions   No  medications on file  Previous Medications   ATENOLOL (TENORMIN) 25 MG TABLET    TAKE ONE TABLET BY MOUTH ONCE DAILY   CALCIUM-MAGNESIUM-VITAMIN D (CALCIUM 500 PO)    Take 1 tablet by mouth daily.   CHOLECALCIFEROL (VITAMIN D) 1000 UNITS TABLET    Take 1,000 Units by mouth daily.   LUTEIN 6 MG CAPS    Take 1 capsule by mouth daily.   MAGNESIUM 250 MG TABS    Take 1 tablet by mouth daily.   VITAMIN A 8000 UNIT CAPSULE    Take 8,000 Units by mouth daily.   VITAMIN C (ASCORBIC ACID) 500 MG TABLET    Take 500 mg by mouth daily.   VITAMIN K PO    Take 40 mcg by mouth daily.  Modified Medications   No medications on file  Discontinued Medications   CALCIUM-VITAMIN D-VITAMIN K (VIACTIV PO)    Take by mouth daily.   CEPHALEXIN (KEFLEX) 250 MG CAPSULE    Take 1 capsule (250 mg total) by mouth 4 (four) times daily.   CHOLECALCIFEROL (VITAMIN D) 400 UNITS TABS TABLET    Take by mouth daily.   MAGNESIUM OXIDE (MAG-OX) 400 MG TABLET    Take 400 mg by mouth daily.   TIMOLOL (BETIMOL) 0.5 % OPHTHALMIC SOLUTION    Place 1 drop into the right eye daily.

## 2014-07-06 NOTE — Progress Notes (Signed)
Pre visit review using our clinic review tool, if applicable. No additional management support is needed unless otherwise documented below in the visit note. 

## 2014-08-09 DIAGNOSIS — H4011X1 Primary open-angle glaucoma, mild stage: Secondary | ICD-10-CM | POA: Diagnosis not present

## 2014-08-18 DIAGNOSIS — H4011X2 Primary open-angle glaucoma, moderate stage: Secondary | ICD-10-CM | POA: Diagnosis not present

## 2014-08-31 DIAGNOSIS — J00 Acute nasopharyngitis [common cold]: Secondary | ICD-10-CM | POA: Diagnosis not present

## 2014-09-04 ENCOUNTER — Other Ambulatory Visit: Payer: Self-pay | Admitting: Family Medicine

## 2014-12-15 DIAGNOSIS — H4011X2 Primary open-angle glaucoma, moderate stage: Secondary | ICD-10-CM | POA: Diagnosis not present

## 2015-02-24 ENCOUNTER — Emergency Department: Payer: Medicare Other

## 2015-02-24 ENCOUNTER — Telehealth: Payer: Self-pay | Admitting: Family Medicine

## 2015-02-24 ENCOUNTER — Emergency Department
Admission: EM | Admit: 2015-02-24 | Discharge: 2015-02-25 | Disposition: A | Discharge status: Home / Self Care | Payer: Medicare Other | Attending: Emergency Medicine | Admitting: Emergency Medicine

## 2015-02-24 DIAGNOSIS — R42 Dizziness and giddiness: Secondary | ICD-10-CM | POA: Diagnosis present

## 2015-02-24 DIAGNOSIS — R002 Palpitations: Secondary | ICD-10-CM | POA: Diagnosis not present

## 2015-02-24 DIAGNOSIS — Z79899 Other long term (current) drug therapy: Secondary | ICD-10-CM | POA: Diagnosis not present

## 2015-02-24 LAB — CBC
HCT: 40.9 % (ref 35.0–47.0)
Hemoglobin: 13.9 g/dL (ref 12.0–16.0)
MCH: 31.1 pg (ref 26.0–34.0)
MCHC: 33.9 g/dL (ref 32.0–36.0)
MCV: 91.8 fL (ref 80.0–100.0)
Platelets: 231 10*3/uL (ref 150–440)
RBC: 4.46 MIL/uL (ref 3.80–5.20)
RDW: 13.7 % (ref 11.5–14.5)
WBC: 7.5 10*3/uL (ref 3.6–11.0)

## 2015-02-24 LAB — BASIC METABOLIC PANEL
Anion gap: 7 (ref 5–15)
BUN: 23 mg/dL — ABNORMAL HIGH (ref 6–20)
CALCIUM: 9.3 mg/dL (ref 8.9–10.3)
CHLORIDE: 104 mmol/L (ref 101–111)
CO2: 27 mmol/L (ref 22–32)
Creatinine, Ser: 0.81 mg/dL (ref 0.44–1.00)
GFR calc non Af Amer: >60 mL/min (ref >60)
Glucose, Bld: 129 mg/dL — ABNORMAL HIGH (ref 65–99)
POTASSIUM: 3.9 mmol/L (ref 3.5–5.1)
Sodium: 138 mmol/L (ref 135–145)

## 2015-02-24 LAB — URINALYSIS COMPLETE WITH MICROSCOPIC (ARMC ONLY)
BACTERIA UA: NONE SEEN
Bilirubin Urine: NEGATIVE
Glucose, UA: NEGATIVE mg/dL
HGB URINE DIPSTICK: NEGATIVE
LEUKOCYTES UA: NEGATIVE
Nitrite: NEGATIVE
PH: 5 (ref 5.0–8.0)
PROTEIN: NEGATIVE mg/dL
Specific Gravity, Urine: 1.024 (ref 1.005–1.030)

## 2015-02-24 LAB — BRAIN NATRIURETIC PEPTIDE: B NATRIURETIC PEPTIDE 5: 57 pg/mL (ref 0.0–100.0)

## 2015-02-24 LAB — TROPONIN I

## 2015-02-24 NOTE — Telephone Encounter (Signed)
Patient in 11's, every intelligent - new / changed arrythmia may  Certainly be present

## 2015-02-24 NOTE — Discharge Instructions (Signed)

## 2015-02-24 NOTE — Telephone Encounter (Signed)
[facility]  Patient Name: Anne Ferrell  DOB: Aug 30, 1932    Initial Comment Caller states she has an irregular heart beat she also has a mitral valve prolapse.    Nurse Assessment  Nurse: Wynetta Emery, RN, Baker Janus Date/Time Eilene Ghazi Time): 02/24/2015 4:04:57 PM  Confirm and document reason for call. If symptomatic, describe symptoms. ---[name] has irregular heart beat -- onset two weeks getting worse or feels different  Has the patient traveled out of the country within the last 30 days? ---No  Does the patient have any new or worsening symptoms? ---Yes  Will a triage be completed? ---Yes  Related visit to physician within the last 2 weeks? ---No  Does the PT have any chronic conditions? (i.e. diabetes, asthma, etc.) ---Yes  List chronic conditions. ---Mitral valve prolapse     Guidelines    Guideline Title Affirmed Question Affirmed Notes  Heart Rate and Heartbeat Questions Age > 60 years (Exception: brief heart beat symptoms that went away and now feels well)    Final Disposition User   See Physician within 4 Hours (or PCP triage) Wynetta Emery, RN, [facility] - ED   Disagree/Comply: Comply

## 2015-02-24 NOTE — ED Provider Notes (Addendum)
[facility] Emergency Department Provider Note  ____________________________________________  Time seen: Approximately 1010 PM  I have reviewed the triage vital signs and the nursing notes.   HISTORY  Chief Complaint Dizziness    HPI Anne Ferrell is a 81 y.o. female with a history of premature atrial complexes who is presenting today with a feeling of her heart skipping and a visual disturbance. She says that she was walking when she felt her heart skip a couple beats and then her vision went gray. Despite the triage note saying that she felt dizzy. The patient denies any dizziness, lightheadedness, shortness of breath or chest pain. She says that she has had these palpitations since 2007. However, she has never had her vision turning gray like this. She says that she can feel her pulse on her wrist skip beats. However, she has never lost consciousness.   Past Medical History  Diagnosis Date  . Arthritis   . Diverticulitis   . Glaucoma   . Heart murmur   . High blood pressure     Patient Active Problem List   Diagnosis Date Noted  . High blood pressure   . Glaucoma   . Diverticulitis   . Arthritis     Past Surgical History  Procedure Laterality Date  . Cataract surgery Bilateral 2011  . Tonsillectomy and adenoidectomy  1941  . Abdominal hysterectomy  2008    Current Outpatient Rx  Name  Route  Sig  Dispense  Refill  . atenolol (TENORMIN) 25 MG tablet      TAKE ONE TABLET BY MOUTH ONCE DAILY   90 tablet   2   . Calcium-Magnesium-Vitamin D (CALCIUM 500 PO)   Oral   Take 1 tablet by mouth daily.         . cholecalciferol (VITAMIN D) 1000 UNITS tablet   Oral   Take 1,000 Units by mouth daily.         . Lutein 6 MG CAPS   Oral   Take 1 capsule by mouth daily.         . Magnesium 250 MG TABS   Oral   Take 1 tablet by mouth daily.         . vitamin A 8000 UNIT capsule   Oral   Take 8,000 Units by mouth daily.          . vitamin C (ASCORBIC ACID) 500 MG tablet   Oral   Take 500 mg by mouth daily.         Marland Kitchen VITAMIN K PO   Oral   Take 40 mcg by mouth daily.           Allergies Anise flavor and Licorice flavor  Family History  Problem Relation Age of Onset  . Cancer Father     prostate  . Colon cancer Neg Hx     Social History Social History  Substance Use Topics  . Smoking status: Never Smoker   . Smokeless tobacco: Never Used  . Alcohol Use: 6.0 oz/week    10 Glasses of wine per week    Review of Systems Constitutional: No fever/chills Eyes: No visual changes. ENT: No sore throat. Cardiovascular: Denies chest pain. Respiratory: Denies shortness of breath. Gastrointestinal: No abdominal pain.  No nausea, no vomiting.  No diarrhea.  No constipation. Genitourinary: Negative for dysuria. Musculoskeletal: Negative for back pain. Skin: Negative for rash. Neurological: Negative for headaches, focal weakness or numbness.  10-point ROS otherwise negative.  ____________________________________________  PHYSICAL EXAM:  VITAL SIGNS: ED Triage Vitals  Enc Vitals Group     BP 02/24/15 1829 171/98 mmHg     Pulse Rate 02/24/15 1829 81     Resp 02/24/15 1829 16     Temp 02/24/15 1829 98.9 F (37.2 C)     Temp Source 02/24/15 1829 Oral     SpO2 02/24/15 1829 96 %     Weight 02/24/15 1829 132 lb (59.875 kg)     Height 02/24/15 1829 5\' 10"  (1.778 m)     Head Cir --      Peak Flow --      Pain Score 02/24/15 1829 0     Pain Loc --      Pain Edu? --      Excl. in [place]? --     Constitutional: Alert and oriented. Well appearing and in no acute distress. Eyes: Conjunctivae are normal. PERRL. EOMI. Head: Atraumatic. Nose: No congestion/rhinnorhea. Mouth/Throat: Mucous membranes are moist.  Oropharynx non-erythematous. Neck: No stridor.   Cardiovascular: Normal rate, regular rhythm. Grossly normal heart sounds.  Good peripheral circulation. Respiratory: Normal respiratory effort.   No retractions. Lungs CTAB. Gastrointestinal: Soft and nontender. No distention. No abdominal bruits. No CVA tenderness. Musculoskeletal: No lower extremity tenderness nor edema.  No joint effusions. Neurologic:  Normal speech and language. No gross focal neurologic deficits are appreciated. No gait instability. Skin:  Skin is warm, dry and intact. No rash noted. Psychiatric: Mood and affect are normal. Speech and behavior are normal.  ____________________________________________   LABS (all labs ordered are listed, but only abnormal results are displayed)  Labs Reviewed  BASIC METABOLIC PANEL - Abnormal; Notable for the following:    Glucose, Bld 129 (*)    BUN 23 (*)    All other components within normal limits  URINALYSIS COMPLETEWITH MICROSCOPIC ([facility] ONLY) - Abnormal; Notable for the following:    Color, Urine YELLOW (*)    APPearance CLEAR (*)    Ketones, ur TRACE (*)    Squamous Epithelial / LPF 0-5 (*)    All other components within normal limits  CBC  TROPONIN I  BRAIN NATRIURETIC PEPTIDE  TROPONIN I  CBG MONITORING, ED   ____________________________________________  EKG  ED ECG REPORT I, Doran Stabler, the attending physician, personally viewed and interpreted this ECG.   Date: 02/24/2015  EKG Time: 1829  Rate: 82  Rhythm: normal sinus rhythm  Axis: Normal axis  Intervals: Left anterior fascicular block.  ST&T Change: No ST elevations or depressions. New T-wave inversion in aVL since 2013.  ____________________________________________  RADIOLOGY  No edema or consolidation.  i personally reviewed this film.   ____________________________________________   PROCEDURES    ____________________________________________   INITIAL IMPRESSION / ASSESSMENT AND PLAN / ED COURSE  Pertinent labs & imaging results that were available during my care of the patient were reviewed by me and considered in my medical decision making (see chart for  details).  ----------------------------------------- 11:42 PM on 02/24/2015 -----------------------------------------  Patient is resting comfortably at this time. No further episodes of her heart skipping a beat or her vision changing. I discussed case with Dr. Humphrey Rolls of cardiology who has agreed see the patient this coming Monday at 1 PM. Because the patient is a new T-wave inversion on her EKG, we will repeat a troponin at midnight. If her troponin is normal and she does not have any malignant rhythm on her monitor that she'll be discharged home to follow with Dr.  Humphrey Rolls. Signed out to Dr. Beather Arbour. ____________________________________________   FINAL CLINICAL IMPRESSION(S) / ED DIAGNOSES  Final diagnoses:  Palpitations      Orbie Pyo, MD 02/24/15 [id number]  Orbie Pyo, MD 02/25/15 [phone]

## 2015-02-24 NOTE — ED Notes (Addendum)
Pt reports irregular heart rhythm past two mornings.  Pt reports during episodes vision becomes "crushed", some sort of visual disturbance.  Pt reports problems with this in past associated timolol eye drops, but pt not currently taking.  Pt denies any CP, SOB, nausea.  Pt NAD at this time, respirations equal and unlabored, skin warm and dry.

## 2015-02-24 NOTE — Telephone Encounter (Signed)
Pt sent to ED

## 2015-02-24 NOTE — ED Notes (Signed)
For the past week patient has had episodes of dizziness and feeling of "heart skipping a beat".

## 2015-02-25 LAB — TROPONIN I

## 2015-02-25 NOTE — ED Provider Notes (Signed)
-----------------------------------------   12:56 AM on 02/25/2015 -----------------------------------------  Patient resting in no acute distress. Updated patient and her daughter of repeat negative troponin. Plan to follow up with Dr. Humphrey Rolls or Dr. Nehemiah Massed (per daughter's preference) early next week. Strict return precautions given. Both verbalize understanding and agree with plan of care.  Paulette Blanch, MD 02/25/15 [phone]

## 2015-02-27 DIAGNOSIS — I10 Essential (primary) hypertension: Secondary | ICD-10-CM | POA: Diagnosis not present

## 2015-02-27 DIAGNOSIS — R55 Syncope and collapse: Secondary | ICD-10-CM | POA: Diagnosis not present

## 2015-02-27 DIAGNOSIS — R9431 Abnormal electrocardiogram [ECG] [EKG]: Secondary | ICD-10-CM | POA: Diagnosis not present

## 2015-03-01 DIAGNOSIS — M25461 Effusion, right knee: Secondary | ICD-10-CM | POA: Diagnosis not present

## 2015-03-01 DIAGNOSIS — M1711 Unilateral primary osteoarthritis, right knee: Secondary | ICD-10-CM | POA: Diagnosis not present

## 2015-03-01 DIAGNOSIS — M25561 Pain in right knee: Secondary | ICD-10-CM | POA: Diagnosis not present

## 2015-03-01 DIAGNOSIS — R079 Chest pain, unspecified: Secondary | ICD-10-CM | POA: Diagnosis not present

## 2015-03-01 DIAGNOSIS — M2391 Unspecified internal derangement of right knee: Secondary | ICD-10-CM | POA: Diagnosis not present

## 2015-03-09 DIAGNOSIS — R55 Syncope and collapse: Secondary | ICD-10-CM | POA: Diagnosis not present

## 2015-03-09 DIAGNOSIS — R9431 Abnormal electrocardiogram [ECG] [EKG]: Secondary | ICD-10-CM | POA: Diagnosis not present

## 2015-03-09 DIAGNOSIS — I739 Peripheral vascular disease, unspecified: Secondary | ICD-10-CM | POA: Diagnosis not present

## 2015-03-10 DIAGNOSIS — I739 Peripheral vascular disease, unspecified: Secondary | ICD-10-CM | POA: Diagnosis not present

## 2015-03-14 DIAGNOSIS — I340 Nonrheumatic mitral (valve) insufficiency: Secondary | ICD-10-CM | POA: Diagnosis not present

## 2015-03-14 DIAGNOSIS — I10 Essential (primary) hypertension: Secondary | ICD-10-CM | POA: Diagnosis not present

## 2015-03-29 ENCOUNTER — Ambulatory Visit (INDEPENDENT_AMBULATORY_CARE_PROVIDER_SITE_OTHER): Payer: Medicare Other

## 2015-03-29 DIAGNOSIS — Z23 Encounter for immunization: Secondary | ICD-10-CM

## 2015-04-01 DIAGNOSIS — M2391 Unspecified internal derangement of right knee: Secondary | ICD-10-CM | POA: Diagnosis not present

## 2015-04-01 DIAGNOSIS — M25461 Effusion, right knee: Secondary | ICD-10-CM | POA: Diagnosis not present

## 2015-04-01 DIAGNOSIS — M1711 Unilateral primary osteoarthritis, right knee: Secondary | ICD-10-CM | POA: Diagnosis not present

## 2015-04-01 DIAGNOSIS — M25561 Pain in right knee: Secondary | ICD-10-CM | POA: Diagnosis not present

## 2015-04-14 DIAGNOSIS — H401132 Primary open-angle glaucoma, bilateral, moderate stage: Secondary | ICD-10-CM | POA: Diagnosis not present

## 2015-05-18 ENCOUNTER — Other Ambulatory Visit: Payer: Self-pay | Admitting: Family Medicine

## 2015-06-03 DIAGNOSIS — M1711 Unilateral primary osteoarthritis, right knee: Secondary | ICD-10-CM | POA: Diagnosis not present

## 2015-06-07 DIAGNOSIS — S61230A Puncture wound without foreign body of right index finger without damage to nail, initial encounter: Secondary | ICD-10-CM | POA: Diagnosis not present

## 2015-06-10 DIAGNOSIS — M1711 Unilateral primary osteoarthritis, right knee: Secondary | ICD-10-CM | POA: Diagnosis not present

## 2015-06-17 DIAGNOSIS — M1711 Unilateral primary osteoarthritis, right knee: Secondary | ICD-10-CM | POA: Diagnosis not present

## 2015-06-24 DIAGNOSIS — M1711 Unilateral primary osteoarthritis, right knee: Secondary | ICD-10-CM | POA: Diagnosis not present

## 2015-07-01 DIAGNOSIS — M25561 Pain in right knee: Secondary | ICD-10-CM | POA: Diagnosis not present

## 2015-07-01 DIAGNOSIS — M25461 Effusion, right knee: Secondary | ICD-10-CM | POA: Diagnosis not present

## 2015-07-01 DIAGNOSIS — M1711 Unilateral primary osteoarthritis, right knee: Secondary | ICD-10-CM | POA: Diagnosis not present

## 2015-08-11 ENCOUNTER — Other Ambulatory Visit: Payer: Self-pay | Admitting: Family Medicine

## 2015-08-14 DIAGNOSIS — H401132 Primary open-angle glaucoma, bilateral, moderate stage: Secondary | ICD-10-CM | POA: Diagnosis not present

## 2015-08-21 DIAGNOSIS — H401132 Primary open-angle glaucoma, bilateral, moderate stage: Secondary | ICD-10-CM | POA: Diagnosis not present

## 2015-08-22 ENCOUNTER — Other Ambulatory Visit: Payer: Self-pay | Admitting: Family Medicine

## 2015-09-12 ENCOUNTER — Other Ambulatory Visit (INDEPENDENT_AMBULATORY_CARE_PROVIDER_SITE_OTHER): Payer: Medicare Other

## 2015-09-12 ENCOUNTER — Other Ambulatory Visit: Payer: Self-pay | Admitting: Family Medicine

## 2015-09-12 ENCOUNTER — Ambulatory Visit (INDEPENDENT_AMBULATORY_CARE_PROVIDER_SITE_OTHER): Payer: Medicare Other

## 2015-09-12 VITALS — BP 120/78 | HR 74 | Temp 97.9°F | Ht 69.5 in | Wt 134.5 lb

## 2015-09-12 DIAGNOSIS — E559 Vitamin D deficiency, unspecified: Secondary | ICD-10-CM

## 2015-09-12 DIAGNOSIS — Z1239 Encounter for other screening for malignant neoplasm of breast: Secondary | ICD-10-CM

## 2015-09-12 DIAGNOSIS — Z23 Encounter for immunization: Secondary | ICD-10-CM | POA: Diagnosis not present

## 2015-09-12 DIAGNOSIS — E2839 Other primary ovarian failure: Secondary | ICD-10-CM

## 2015-09-12 DIAGNOSIS — R5383 Other fatigue: Secondary | ICD-10-CM | POA: Diagnosis not present

## 2015-09-12 DIAGNOSIS — Z0000 Encounter for general adult medical examination without abnormal findings: Secondary | ICD-10-CM | POA: Diagnosis not present

## 2015-09-12 DIAGNOSIS — E785 Hyperlipidemia, unspecified: Secondary | ICD-10-CM

## 2015-09-12 DIAGNOSIS — Z13820 Encounter for screening for osteoporosis: Secondary | ICD-10-CM

## 2015-09-12 LAB — LIPID PANEL
CHOLESTEROL: 229 mg/dL — AB (ref 0–200)
HDL: 69.7 mg/dL (ref >39.00)
LDL CALC: 146 mg/dL — AB (ref 0–99)
NONHDL: 159.21
Total CHOL/HDL Ratio: 3
Triglycerides: 67 mg/dL (ref 0.0–149.0)
VLDL: 13.4 mg/dL (ref 0.0–40.0)

## 2015-09-12 LAB — BASIC METABOLIC PANEL
BUN: 21 mg/dL (ref 6–23)
CALCIUM: 9.2 mg/dL (ref 8.4–10.5)
CHLORIDE: 105 meq/L (ref 96–112)
CO2: 29 meq/L (ref 19–32)
Creatinine, Ser: 0.71 mg/dL (ref 0.40–1.20)
GFR: 83.93 mL/min (ref >60.00)
Glucose, Bld: 103 mg/dL — ABNORMAL HIGH (ref 70–99)
POTASSIUM: 4.1 meq/L (ref 3.5–5.1)
SODIUM: 140 meq/L (ref 135–145)

## 2015-09-12 LAB — CBC WITH DIFFERENTIAL/PLATELET
BASOS PCT: 0.7 % (ref 0.0–3.0)
Basophils Absolute: 0 10*3/uL (ref 0.0–0.1)
EOS PCT: 0.8 % (ref 0.0–5.0)
Eosinophils Absolute: 0.1 10*3/uL (ref 0.0–0.7)
HEMATOCRIT: 40.9 % (ref 36.0–46.0)
Hemoglobin: 13.7 g/dL (ref 12.0–15.0)
LYMPHS ABS: 0.8 10*3/uL (ref 0.7–4.0)
LYMPHS PCT: 13 % (ref 12.0–46.0)
MCHC: 33.6 g/dL (ref 30.0–36.0)
MCV: 91.9 fl (ref 78.0–100.0)
MONOS PCT: 7.5 % (ref 3.0–12.0)
Monocytes Absolute: 0.5 10*3/uL (ref 0.1–1.0)
NEUTROS ABS: 4.9 10*3/uL (ref 1.4–7.7)
NEUTROS PCT: 78 % — AB (ref 43.0–77.0)
PLATELETS: 244 10*3/uL (ref 150.0–400.0)
RBC: 4.45 Mil/uL (ref 3.87–5.11)
RDW: 13.5 % (ref 11.5–15.5)
WBC: 6.2 10*3/uL (ref 4.0–10.5)

## 2015-09-12 LAB — HEPATIC FUNCTION PANEL
ALT: 18 U/L (ref 0–35)
AST: 25 U/L (ref 0–37)
Albumin: 4.3 g/dL (ref 3.5–5.2)
Alkaline Phosphatase: 62 U/L (ref 39–117)
BILIRUBIN DIRECT: 0.1 mg/dL (ref 0.0–0.3)
BILIRUBIN TOTAL: 0.6 mg/dL (ref 0.2–1.2)
Total Protein: 6.4 g/dL (ref 6.0–8.3)

## 2015-09-12 LAB — VITAMIN D 25 HYDROXY (VIT D DEFICIENCY, FRACTURES): VITD: 25.68 ng/mL — ABNORMAL LOW (ref 30.00–100.00)

## 2015-09-12 LAB — TSH: TSH: 0.77 u[IU]/mL (ref 0.35–4.50)

## 2015-09-12 NOTE — Progress Notes (Signed)
Pre visit review using our clinic review tool, if applicable. No additional management support is needed unless otherwise documented below in the visit note. 

## 2015-09-12 NOTE — Progress Notes (Signed)
Subjective:   Anne Ferrell is a 82 y.o. female who presents for Medicare Annual/Subsequent preventive examination.  Review of Systems:  N/A Cardiac Risk Factors include: advanced age (>70men, >72 women);hypertension     Objective:    Vitals: BP 120/78 mmHg  Pulse 74  Temp(Src) 97.9 F (36.6 C) (Oral)  Ht 5' 9.5" (1.765 m)  Wt 134 lb 8 oz (61.009 kg)  BMI 19.58 kg/m2  SpO2 98%  Body mass index is 19.58 kg/(m^2).  Tobacco History  Smoking status  . Never Smoker   Smokeless tobacco  . Never Used     Counseling given: No   Past Medical History  Diagnosis Date  . Arthritis   . Diverticulitis   . Glaucoma   . Heart murmur   . High blood pressure    Past Surgical History  Procedure Laterality Date  . Cataract surgery Bilateral 2011  . Tonsillectomy and adenoidectomy  1941  . Abdominal hysterectomy  2008   Family History  Problem Relation Age of Onset  . Cancer Father     prostate  . Colon cancer Neg Hx    History  Sexual Activity  . Sexual Activity: Not on file    Outpatient Encounter Prescriptions as of 09/12/2015  Medication Sig  . atenolol (TENORMIN) 25 MG tablet TAKE ONE TABLET BY MOUTH ONCE DAILY  . cholecalciferol (VITAMIN D) 1000 UNITS tablet Take 1,000 Units by mouth daily.  . Lutein 6 MG CAPS Take 1 capsule by mouth daily.  . Magnesium 250 MG TABS Take 1 tablet by mouth daily.  . vitamin A 8000 UNIT capsule Take 8,000 Units by mouth daily.  . vitamin C (ASCORBIC ACID) 500 MG tablet Take 500 mg by mouth daily.  . [DISCONTINUED] Calcium-Magnesium-Vitamin D (CALCIUM 500 PO) Take 1 tablet by mouth daily.  . [DISCONTINUED] VITAMIN K PO Take 40 mcg by mouth daily.   No facility-administered encounter medications on file as of 09/12/2015.    Activities of Daily Living In your present state of health, do you have any difficulty performing the following activities: 09/12/2015  Hearing? N  Vision? N  Difficulty concentrating or making decisions? N    Walking or climbing stairs? N  Dressing or bathing? N  Doing errands, shopping? N  Preparing Food and eating ? N  Using the Toilet? N  In the past six months, have you accidently leaked urine? N  Do you have problems with loss of bowel control? N  Managing your Medications? N  Managing your Finances? N  Housekeeping or managing your Housekeeping? N    Patient Care Team: Owens Loffler, MD as PCP - General (Family Medicine) Leandrew Koyanagi, MD as Referring Physician (Ophthalmology) Dionisio David, MD as Consulting Physician (Cardiology) Verda Cumins, MD as Consulting Physician (Rehabilitation)   Assessment:     Hearing Screening   125Hz  250Hz  500Hz  1000Hz  2000Hz  4000Hz  8000Hz   Right ear:   40 0 0 0   Left ear:   40 0 0 0   Vision Screening Comments: Last eye exam with Dr. Wallace Going on August 21, 2015   Exercise Activities and Dietary recommendations Current Exercise Habits: Home exercise routine, Type of exercise: walking, Time (Minutes): 30, Frequency (Times/Week): 3, Weekly Exercise (Minutes/Week): 90, Intensity: Mild, Exercise limited by: None identified  Goals    . Increase physical activity     Starting 09/12/2015, I will continue to walk at least 30 min 3 days per week.       Fall Risk  Fall Risk  09/12/2015 07/06/2014 04/12/2013  Falls in the past year? No No No   Depression Screen PHQ 2/9 Scores 09/12/2015 07/06/2014 04/12/2013  PHQ - 2 Score 0 0 0    Cognitive Testing MMSE - Mini Mental State Exam 09/12/2015  Orientation to time 5  Orientation to Place 5  Registration 3  Attention/ Calculation 0  Recall 3  Language- name 2 objects 0  Language- repeat 1  Language- follow 3 step command 3  Language- read & follow direction 0  Write a sentence 0  Copy design 0  Total score 20   PLEASE NOTE: A Mini-Cog screen was completed. Maximum score is 20. A value of 0 denotes this part of [facility] MMSE was not completed or the patient failed this part of the  Mini-Cog screening.   Mini-Cog Screening Orientation to Time - Max 5 pts Orientation to Place - Max 5 pts Registration - Max 3 pts Recall - Max 3 pts Language Repeat - Max 1 pts Language Follow 3 Step Command - Max 3 pts   Immunization History  Administered Date(s) Administered  . Influenza,inj,Quad PF,36+ Mos 04/12/2013, 03/10/2014, 03/29/2015  . Pneumococcal Polysaccharide-23 04/12/2013   Screening Tests Health Maintenance  Topic Date Due  . ZOSTAVAX  04/13/2018 (Originally 07/01/1994)  . INFLUENZA VACCINE  11/28/2015  . MAMMOGRAM  09/11/2016  . TETANUS/TDAP  06/13/2025  . DEXA SCAN  Completed  . PNA vac Low Risk Adult  Completed      Plan:     I have personally reviewed and addressed the Medicare Annual Wellness questionnaire and have noted the following in the patient's chart:  A. Medical and social history B. Use of alcohol, tobacco or illicit drugs  C. Current medications and supplements D. Functional ability and status E.  Nutritional status F.  Physical activity G. Advance directives H. List of other physicians I.  Hospitalizations, surgeries, and ER visits in previous 12 months J.  [place] to include hearing, vision, cognitive, depression L. Referrals and appointments - none  In addition, I have reviewed and discussed with patient certain preventive protocols, quality metrics, and best practice recommendations. A written personalized care plan for preventive services as well as general preventive health recommendations were provided to patient.  See attached scanned questionnaire for additional information.   Signed,   Lindell Noe, MHA, BS, LPN Health Advisor [id number]

## 2015-09-12 NOTE — Progress Notes (Signed)
I reviewed health advisor's note, was available for consultation, and agree with documentation and plan.  Eliezer Lofts MD.

## 2015-09-12 NOTE — Progress Notes (Signed)
PCP notes:  Health maintenance: PCV13 administered. Mammogram and bone density orders generated. Pt spoke with referral coordinator to schedule appt. Pt received tetanus vaccine in Feb 2017.   Abnormal screenings: Hearing-failed. Please assess pt at next appt on 09/18/15.

## 2015-09-12 NOTE — Patient Instructions (Signed)
Ms. Zaiger , Thank you for taking time to come for your Medicare Wellness Visit. I appreciate your ongoing commitment to your health goals. Please review the following plan we discussed and let me know if I can assist you in the future.   These are the goals we discussed: Goals    . Increase physical activity     Starting 09/12/2015, I will continue to walk at least 30 min 3 days per week.        This is a list of the screening recommended for you and due dates:  Health Maintenance  Topic Date Due  . Shingles Vaccine  04/13/2018*  . Flu Shot  11/28/2015  . Mammogram  09/11/2016  . Tetanus Vaccine  06/13/2025  . DEXA scan (bone density measurement)  Completed  . Pneumonia vaccines  Completed  *Topic was postponed. The date shown is not the original due date.   Preventive Care for Adults  A healthy lifestyle and preventive care can promote health and wellness. Preventive health guidelines for adults include the following key practices.  . A routine yearly physical is a good way to check with your health care provider about your health and preventive screening. It is a chance to share any concerns and updates on your health and to receive a thorough exam.  . Visit your dentist for a routine exam and preventive care every 6 months. Brush your teeth twice a day and floss once a day. Good oral hygiene prevents tooth decay and gum disease.  . The frequency of eye exams is based on your age, health, family medical history, use  of contact lenses, and other factors. Follow your health care provider's ecommendations for frequency of eye exams.  . Eat a healthy diet. Foods like vegetables, fruits, whole grains, low-fat dairy products, and lean protein foods contain the nutrients you need without too many calories. Decrease your intake of foods high in solid fats, added sugars, and salt. Eat the right amount of calories for you. Get information about a proper diet from your health care  provider, if necessary.  . Regular physical exercise is one of the most important things you can do for your health. Most adults should get at least 150 minutes of moderate-intensity exercise (any activity that increases your heart rate and causes you to sweat) each week. In addition, most adults need muscle-strengthening exercises on 2 or more days a week.  Silver Sneakers may be a benefit available to you. To determine eligibility, you may visit the website: www.silversneakers.com or contact program at 860-104-3619 Mon-Fri between 8AM-8PM.   . Maintain a healthy weight. The body mass index (BMI) is a screening tool to identify possible weight problems. It provides an estimate of body fat based on height and weight. Your health care provider can find your BMI and can help you achieve or maintain a healthy weight.   For adults 20 years and older: ? A BMI below 18.5 is considered underweight. ? A BMI of 18.5 to 24.9 is normal. ? A BMI of 25 to 29.9 is considered overweight. ? A BMI of 30 and above is considered obese.   . Maintain normal blood lipids and cholesterol levels by exercising and minimizing your intake of saturated fat. Eat a balanced diet with plenty of fruit and vegetables. Blood tests for lipids and cholesterol should begin at age 59 and be repeated every 5 years. If your lipid or cholesterol levels are high, you are over 50, or you are  at high risk for heart disease, you may need your cholesterol levels checked more frequently. Ongoing high lipid and cholesterol levels should be treated with medicines if diet and exercise are not working.  . If you smoke, find out from your health care provider how to quit. If you do not use tobacco, please do not start.  . If you choose to drink alcohol, please do not consume more than 2 drinks per day. One drink is considered to be 12 ounces (355 mL) of beer, 5 ounces (148 mL) of wine, or 1.5 ounces (44 mL) of liquor.  . If you are 24-79 years  old, ask your health care provider if you should take aspirin to prevent strokes.  . Use sunscreen. Apply sunscreen liberally and repeatedly throughout the day. You should seek shade when your shadow is shorter than you. Protect yourself by wearing long sleeves, pants, a wide-brimmed hat, and sunglasses year round, whenever you are outdoors.  . Once a month, do a whole body skin exam, using a mirror to look at the skin on your back. Tell your health care provider of new moles, moles that have irregular borders, moles that are larger than a pencil eraser, or moles that have changed in shape or color.

## 2015-09-18 ENCOUNTER — Encounter: Payer: Self-pay | Admitting: Family Medicine

## 2015-09-18 ENCOUNTER — Ambulatory Visit (INDEPENDENT_AMBULATORY_CARE_PROVIDER_SITE_OTHER): Payer: Medicare Other | Admitting: Family Medicine

## 2015-09-18 VITALS — BP 140/74 | HR 75 | Temp 98.2°F | Ht 69.5 in | Wt 137.5 lb

## 2015-09-18 DIAGNOSIS — I10 Essential (primary) hypertension: Secondary | ICD-10-CM

## 2015-09-18 DIAGNOSIS — E559 Vitamin D deficiency, unspecified: Secondary | ICD-10-CM | POA: Diagnosis not present

## 2015-09-18 HISTORY — DX: Vitamin D deficiency, unspecified: E55.9

## 2015-09-18 NOTE — Progress Notes (Signed)
Dr. Frederico Hamman T. Nigil Braman, MD, [facility] and [facility] [place], [address] Phone: [phone] Fax: [phone]  09/18/2015  Patient: Anne Ferrell, MRN: [id number], DOB: 1932-12-04, 82 y.o.  Primary Physician:  Owens Loffler, MD   Chief Complaint  Patient presents with  . Medicare Wellness    Part 2   Subjective:   Anne Ferrell is a 82 y.o. very pleasant female patient who presents with the following:  Decreased hearing.   HTN: Tolerating all medications without side effects Stable and at goal No CP, no sob. No HA.  BP Readings from Last 3 Encounters:  09/18/15 140/74  09/12/15 120/78  02/25/15 [id number]    Basic Metabolic Panel:    Component Value Date/Time   NA 140 09/12/2015 1023   K 4.1 09/12/2015 1023   CL 105 09/12/2015 1023   CO2 29 09/12/2015 1023   BUN 21 09/12/2015 1023   CREATININE 0.71 09/12/2015 1023   GLUCOSE 103* 09/12/2015 1023   CALCIUM 9.2 09/12/2015 1023      Past Medical History, Surgical History, Social History, Family History, Problem List, Medications, and Allergies have been reviewed and updated if relevant.  Patient Active Problem List   Diagnosis Date Noted  . High blood pressure   . Glaucoma   . Diverticulitis   . Arthritis     Past Medical History  Diagnosis Date  . Arthritis   . Diverticulitis   . Glaucoma   . Heart murmur   . High blood pressure     Past Surgical History  Procedure Laterality Date  . Cataract surgery Bilateral 2011  . Tonsillectomy and adenoidectomy  1941  . Abdominal hysterectomy  2008    Social History   Social History  . Marital Status: Widowed    Spouse Name: N/A  . Number of Children: N/A  . Years of Education: N/A   Occupational History  . Not on file.   Social History Main Topics  . Smoking status: Never Smoker   . Smokeless tobacco: Never Used  . Alcohol Use: 6.0 oz/week    10 Glasses of wine per week  . Drug Use: No  .  Sexual Activity: Not on file   Other Topics Concern  . Not on file   Social History Narrative   Mother of Koleen Nimrod    Family History  Problem Relation Age of Onset  . Cancer Father     prostate  . Colon cancer Neg Hx     Allergies  Allergen Reactions  . Anise Flavor Other (See Comments)    Feels like she is going to pass out  . Licorice Flavor [Flavoring Agent] Palpitations    Medication list reviewed and updated in full in [facility].   GEN: No acute illnesses, no fevers, chills. GI: No n/v/d, eating normally Pulm: No SOB Interactive and getting along well at home.  Otherwise, ROS is as per the HPI.  Objective:   BP 140/74 mmHg  Pulse 75  Temp(Src) 98.2 F (36.8 C) (Oral)  Ht 5' 9.5" (1.765 m)  Wt 137 lb 8 oz (62.37 kg)  BMI 20.02 kg/m2  GEN: WDWN, NAD, Non-toxic, A & O x 3 HEENT: Atraumatic, Normocephalic. Neck supple. No masses, No LAD. Ears and Nose: No external deformity. CV: RRR, No M/G/R. No JVD. No thrill. No extra heart sounds. PULM: CTA B, no wheezes, crackles, rhonchi. No retractions. No resp. distress. No accessory muscle use. EXTR: No c/c/e  NEURO Normal gait.  PSYCH: Normally interactive. Conversant. Not depressed or anxious appearing.  Calm demeanor.   Laboratory and Imaging Data: Results for orders placed or performed in visit on 09/12/15  Lipid panel  Result Value Ref Range   Cholesterol 229 (H) 0 - 200 mg/dL   Triglycerides 67.0 0.0 - 149.0 mg/dL   HDL 69.70 >39.00 mg/dL   VLDL 13.4 0.0 - 40.0 mg/dL   LDL Cholesterol 146 (H) 0 - 99 mg/dL   Total CHOL/HDL Ratio 3    NonHDL 159.21   Hepatic function panel  Result Value Ref Range   Total Bilirubin 0.6 0.2 - 1.2 mg/dL   Bilirubin, Direct 0.1 0.0 - 0.3 mg/dL   Alkaline Phosphatase 62 39 - 117 U/L   AST 25 0 - 37 U/L   ALT 18 0 - 35 U/L   Total Protein 6.4 6.0 - 8.3 g/dL   Albumin 4.3 3.5 - 5.2 g/dL  TSH  Result Value Ref Range   TSH 0.77 0.35 - 4.50 uIU/mL  Basic  metabolic panel  Result Value Ref Range   Sodium 140 135 - 145 mEq/L   Potassium 4.1 3.5 - 5.1 mEq/L   Chloride 105 96 - 112 mEq/L   CO2 29 19 - 32 mEq/L   Glucose, Bld 103 (H) 70 - 99 mg/dL   BUN 21 6 - 23 mg/dL   Creatinine, Ser 0.71 0.40 - 1.20 mg/dL   Calcium 9.2 8.4 - 10.5 mg/dL   GFR 83.93 >60.00 mL/min  CBC with Differential/Platelet  Result Value Ref Range   WBC 6.2 4.0 - 10.5 K/uL   RBC 4.45 3.87 - 5.11 Mil/uL   Hemoglobin 13.7 12.0 - 15.0 g/dL   HCT 40.9 36.0 - 46.0 %   MCV 91.9 78.0 - 100.0 fl   MCHC 33.6 30.0 - 36.0 g/dL   RDW 13.5 11.5 - 15.5 %   Platelets 244.0 150.0 - 400.0 K/uL   Neutrophils Relative % 78.0 (H) 43.0 - 77.0 %   Lymphocytes Relative 13.0 12.0 - 46.0 %   Monocytes Relative 7.5 3.0 - 12.0 %   Eosinophils Relative 0.8 0.0 - 5.0 %   Basophils Relative 0.7 0.0 - 3.0 %   Neutro Abs 4.9 1.4 - 7.7 K/uL   Lymphs Abs 0.8 0.7 - 4.0 K/uL   Monocytes Absolute 0.5 0.1 - 1.0 K/uL   Eosinophils Absolute 0.1 0.0 - 0.7 K/uL   Basophils Absolute 0.0 0.0 - 0.1 K/uL  VITAMIN D 25 Hydroxy (Vit-D Deficiency, Fractures)  Result Value Ref Range   VITD 25.68 (L) 30.00 - 100.00 ng/mL     Assessment and Plan:   Essential hypertension  Vitamin D deficiency  Doing well, labs reviewed.  HTN stable.   Add Vit D  Follow-up: 1 year  Signed,  Frederico Hamman T. Rayneisha Bouza, MD   Patient's Medications  New Prescriptions   No medications on file  Previous Medications   ATENOLOL (TENORMIN) 25 MG TABLET    TAKE ONE TABLET BY MOUTH ONCE DAILY   CHOLECALCIFEROL (VITAMIN D) 1000 UNITS TABLET    Take 1,000 Units by mouth daily.   LUTEIN 6 MG CAPS    Take 1 capsule by mouth daily.   MAGNESIUM 250 MG TABS    Take 1 tablet by mouth daily.   VITAMIN A 8000 UNIT CAPSULE    Take 8,000 Units by mouth daily.   VITAMIN C (ASCORBIC ACID) 500 MG TABLET    Take 500 mg by mouth daily.  Modified Medications   No medications on file  Discontinued Medications   No medications on file

## 2015-09-18 NOTE — Progress Notes (Signed)
Pre visit review using our clinic review tool, if applicable. No additional management support is needed unless otherwise documented below in the visit note. 

## 2015-09-21 ENCOUNTER — Other Ambulatory Visit: Payer: Self-pay | Admitting: Family Medicine

## 2015-09-21 ENCOUNTER — Ambulatory Visit
Admission: RE | Admit: 2015-09-21 | Discharge: 2015-09-21 | Disposition: A | Discharge status: Home / Self Care | Payer: Medicare Other | Source: Ambulatory Visit | Attending: Family Medicine | Admitting: Family Medicine

## 2015-09-21 DIAGNOSIS — Z1231 Encounter for screening mammogram for malignant neoplasm of breast: Secondary | ICD-10-CM | POA: Diagnosis not present

## 2015-09-21 DIAGNOSIS — E2839 Other primary ovarian failure: Secondary | ICD-10-CM | POA: Diagnosis not present

## 2015-09-21 DIAGNOSIS — Z13820 Encounter for screening for osteoporosis: Secondary | ICD-10-CM

## 2015-09-21 DIAGNOSIS — Z1239 Encounter for other screening for malignant neoplasm of breast: Secondary | ICD-10-CM

## 2015-09-21 DIAGNOSIS — M810 Age-related osteoporosis without current pathological fracture: Secondary | ICD-10-CM | POA: Diagnosis not present

## 2015-09-27 ENCOUNTER — Other Ambulatory Visit: Payer: Self-pay | Admitting: Family Medicine

## 2015-09-27 DIAGNOSIS — N6489 Other specified disorders of breast: Secondary | ICD-10-CM

## 2015-10-05 ENCOUNTER — Encounter: Payer: Self-pay | Admitting: Family Medicine

## 2015-10-05 ENCOUNTER — Ambulatory Visit
Admission: RE | Admit: 2015-10-05 | Discharge: 2015-10-05 | Disposition: A | Discharge status: Home / Self Care | Payer: Medicare Other | Source: Ambulatory Visit | Attending: Family Medicine | Admitting: Family Medicine

## 2015-10-05 ENCOUNTER — Ambulatory Visit (INDEPENDENT_AMBULATORY_CARE_PROVIDER_SITE_OTHER): Payer: Medicare Other | Admitting: Family Medicine

## 2015-10-05 VITALS — BP 130/88 | HR 86 | Temp 98.4°F | Ht 69.5 in | Wt 135.5 lb

## 2015-10-05 DIAGNOSIS — M810 Age-related osteoporosis without current pathological fracture: Secondary | ICD-10-CM | POA: Diagnosis not present

## 2015-10-05 DIAGNOSIS — N6489 Other specified disorders of breast: Secondary | ICD-10-CM | POA: Diagnosis not present

## 2015-10-05 DIAGNOSIS — R928 Other abnormal and inconclusive findings on diagnostic imaging of breast: Secondary | ICD-10-CM | POA: Diagnosis not present

## 2015-10-05 HISTORY — DX: Age-related osteoporosis without current pathological fracture: M81.0

## 2015-10-05 MED ORDER — ALENDRONATE SODIUM 70 MG PO TABS: 70.0000 mg | ORAL_TABLET | ORAL | Status: DC

## 2015-10-05 NOTE — Progress Notes (Signed)
Pre visit review using our clinic review tool, if applicable. No additional management support is needed unless otherwise documented below in the visit note. 

## 2015-10-05 NOTE — Progress Notes (Signed)
   Dr. Frederico Hamman T. Keston Seever, MD, [facility] Primary [facility] and Sports Medicine [place], [address] Phone: [phone] Fax: [phone]  10/05/2015  Patient: Anne Ferrell, MRN: [id number], DOB: [birth year], 82 y.o.  Primary Physician:  Owens Loffler, MD   Chief Complaint  Patient presents with  . Follow-up    Dexa Scan   Subjective:   Anne Ferrell is a 82 y.o. very pleasant female patient who presents with the following:  >15 minutes spent in face to face time with patient, >50% spent in counselling or coordination of care   DUAL X-RAY ABSORPTIOMETRY (DXA) FOR BONE MINERAL DENSITY  IMPRESSION: Dear Dr. Lorelei Pont,  Your patient Anne Ferrell completed a BMD test on 09/21/2015 using the [place] (analysis version: 14.10) manufactured by [employer]. The following summarizes the results of our evaluation.  PATIENT BIOGRAPHICAL: Name: Anne, Ferrell Patient ID: [id number] Birth Date: 09/22/1932 Height: 69.5 in. Gender: Female Exam Date: 09/21/2015 Weight: 136.0 lbs. Indications: Caucasian, Hysterectomy, Oophorectomy Bilateral Fractures: Treatments:  ASSESSMENT:  The BMD measured at Femur Neck Left is 0.612 g/cm2 with a T-score of -3.1. This patient is considered osteoporotic according to [facility]) criteria. L4 was excluded due to (=degenerative changes. Site Region Measured Measured WHO Young Adult BMD Date Age Classification T-score AP Spine L1-L3 09/21/2015 81.2 Osteoporosis -2.9 0.830 g/cm2  DualFemur Neck Left 09/21/2015 81.2 Osteoporosis -3.1 0.612 g/cm2  World Health Organization [facility]) criteria for post-menopausal, Caucasian Women: Normal: T-score at or above -1 SD Osteopenia: T-score between -1 and -2.5 SD Osteoporosis: T-score at or below -2.5 SD  RECOMMENDATIONS: [place] recommends that FDA-approved medical therapies be considered  in postmenopausal women and men age 14 or older with a: 1. Hip or vertebral (clinical or morphometric) fracture. 2. T-score of < -2.5 at the spine or hip. 3. Ten-year fracture probability by FRAX of 3% or greater for hip fracture or 20% or greater for major osteoporotic fracture.  All treatment decisions require clinical judgment and consideration of individual patient factors, including patient preferences, co-morbidities, previous drug use, risk factors not captured in the FRAX model (e.g. falls, vitamin D deficiency, increased bone turnover, interval significant decline in bone density) and possible under - or over-estimation of fracture risk by FRAX.  All patients should ensure an adequate intake of dietary calcium (1200 mg/d) and vitamin D (800 IU daily) unless contraindicated.  FOLLOW-UP: People with diagnosed cases of osteoporosis or at high risk for fracture should have regular bone mineral density tests. For patients eligible for Medicare, routine testing is allowed once every 2 years. The testing frequency can be increased to one year for patients who have rapidly progressing disease, those who are receiving or discontinuing medical therapy to restore bone mass, or have additional risk factors.  I have reviewed this report, and agree with the above findings.  [facility] Radiology   Electronically Signed  By: Franki Cabot M.D.  On: 09/21/2015 13:47  Significant osteoporosis at all levels.  Increase calcium to 600 mg bid Cont Vit d 2000 units  Previously on fosamax for ? 1 year.  Discussed other options and will retry fosamax weekly.  Signed,  Maud Deed. Ailey Wessling, MD

## 2015-10-27 DIAGNOSIS — L821 Other seborrheic keratosis: Secondary | ICD-10-CM | POA: Diagnosis not present

## 2015-11-02 ENCOUNTER — Other Ambulatory Visit: Payer: Self-pay | Admitting: Family Medicine

## 2016-01-06 DIAGNOSIS — M25661 Stiffness of right knee, not elsewhere classified: Secondary | ICD-10-CM | POA: Diagnosis not present

## 2016-01-06 DIAGNOSIS — M1711 Unilateral primary osteoarthritis, right knee: Secondary | ICD-10-CM | POA: Diagnosis not present

## 2016-01-06 DIAGNOSIS — M25561 Pain in right knee: Secondary | ICD-10-CM | POA: Diagnosis not present

## 2016-01-06 DIAGNOSIS — M2241 Chondromalacia patellae, right knee: Secondary | ICD-10-CM | POA: Diagnosis not present

## 2016-02-22 DIAGNOSIS — H401132 Primary open-angle glaucoma, bilateral, moderate stage: Secondary | ICD-10-CM | POA: Diagnosis not present

## 2016-02-29 DIAGNOSIS — H1131 Conjunctival hemorrhage, right eye: Secondary | ICD-10-CM | POA: Diagnosis not present

## 2016-03-05 ENCOUNTER — Ambulatory Visit (INDEPENDENT_AMBULATORY_CARE_PROVIDER_SITE_OTHER): Payer: Medicare Other

## 2016-03-05 DIAGNOSIS — Z23 Encounter for immunization: Secondary | ICD-10-CM | POA: Diagnosis not present

## 2016-03-13 DIAGNOSIS — I351 Nonrheumatic aortic (valve) insufficiency: Secondary | ICD-10-CM | POA: Diagnosis not present

## 2016-03-13 DIAGNOSIS — I340 Nonrheumatic mitral (valve) insufficiency: Secondary | ICD-10-CM | POA: Diagnosis not present

## 2016-03-13 DIAGNOSIS — I10 Essential (primary) hypertension: Secondary | ICD-10-CM | POA: Diagnosis not present

## 2016-04-09 DIAGNOSIS — H401132 Primary open-angle glaucoma, bilateral, moderate stage: Secondary | ICD-10-CM | POA: Diagnosis not present

## 2016-04-30 ENCOUNTER — Other Ambulatory Visit: Payer: Self-pay | Admitting: Family Medicine

## 2016-05-02 DIAGNOSIS — H401132 Primary open-angle glaucoma, bilateral, moderate stage: Secondary | ICD-10-CM | POA: Diagnosis not present

## 2016-05-03 DIAGNOSIS — M1711 Unilateral primary osteoarthritis, right knee: Secondary | ICD-10-CM | POA: Diagnosis not present

## 2016-07-01 DIAGNOSIS — H401132 Primary open-angle glaucoma, bilateral, moderate stage: Secondary | ICD-10-CM | POA: Diagnosis not present

## 2016-07-08 DIAGNOSIS — H401132 Primary open-angle glaucoma, bilateral, moderate stage: Secondary | ICD-10-CM | POA: Diagnosis not present

## 2016-07-18 DIAGNOSIS — M1711 Unilateral primary osteoarthritis, right knee: Secondary | ICD-10-CM | POA: Diagnosis not present

## 2016-07-25 DIAGNOSIS — M1711 Unilateral primary osteoarthritis, right knee: Secondary | ICD-10-CM | POA: Diagnosis not present

## 2016-08-01 DIAGNOSIS — M1711 Unilateral primary osteoarthritis, right knee: Secondary | ICD-10-CM | POA: Diagnosis not present

## 2016-08-21 DIAGNOSIS — H401132 Primary open-angle glaucoma, bilateral, moderate stage: Secondary | ICD-10-CM | POA: Diagnosis not present

## 2016-09-10 DIAGNOSIS — I10 Essential (primary) hypertension: Secondary | ICD-10-CM | POA: Diagnosis not present

## 2016-09-10 DIAGNOSIS — I340 Nonrheumatic mitral (valve) insufficiency: Secondary | ICD-10-CM | POA: Diagnosis not present

## 2016-09-10 DIAGNOSIS — I351 Nonrheumatic aortic (valve) insufficiency: Secondary | ICD-10-CM | POA: Diagnosis not present

## 2016-09-11 DIAGNOSIS — H401132 Primary open-angle glaucoma, bilateral, moderate stage: Secondary | ICD-10-CM | POA: Diagnosis not present

## 2016-09-13 ENCOUNTER — Telehealth: Payer: Self-pay | Admitting: Family Medicine

## 2016-09-13 NOTE — Telephone Encounter (Signed)
TH scheduled appt for anxiety issues on 09/16/16 at 10:30 with Dr Deborra Medina; I changed appt for 15' to 30' but still at same time.

## 2016-09-13 NOTE — Telephone Encounter (Signed)
[facility] Patient Name: Anne Ferrell DOB: 1933/03/21 Initial Comment Caller states her heart rate is off. Nurse Assessment Nurse: Dimas Chyle, RN, Dellis Filbert Date/Time Eilene Ghazi Time): 09/13/2016 9:13:58 AM Confirm and document reason for call. If symptomatic, describe symptoms. ---Caller states her heart rate is off. Had elevated HR and B/P yesterday. HR between 70 and 90. Last B/P was 137/88. Does the patient have any new or worsening symptoms? ---Yes Will a triage be completed? ---Yes Related visit to physician within the last 2 weeks? ---Yes Does the PT have any chronic conditions? (i.e. diabetes, asthma, etc.) ---Yes List chronic conditions. ---HTN and osteoporosis Is this a behavioral health or substance abuse call? ---No Guidelines Guideline Title Affirmed Question Affirmed Notes Heart Rate and Heartbeat Questions Problems with anxiety or stress Final Disposition User See PCP within 2 Unk Pinto, RN, Dellis Filbert Disagree/Comply: Comply

## 2016-09-16 ENCOUNTER — Ambulatory Visit (INDEPENDENT_AMBULATORY_CARE_PROVIDER_SITE_OTHER): Payer: Medicare Other | Admitting: Family Medicine

## 2016-09-16 ENCOUNTER — Encounter: Payer: Self-pay | Admitting: Family Medicine

## 2016-09-16 DIAGNOSIS — F4322 Adjustment disorder with anxiety: Secondary | ICD-10-CM | POA: Insufficient documentation

## 2016-09-16 MED ORDER — ALPRAZOLAM 0.25 MG PO TABS: 0.2500 mg | ORAL_TABLET | Freq: Three times a day (TID) | ORAL | 0 refills | Status: DC | PRN

## 2016-09-16 NOTE — Assessment & Plan Note (Addendum)
New-  >25 minutes spent in face to face time with patient, >50% spent in counselling or coordination of care I did advise that she start a daily rx like remeron or zoloft but she would like to try a low dose xanax as needed as she did 8 years ago when her husband died.  Discussed sedation and addiction potential. She will discuss possible daily rx with Dr. Lorelei Pont at her previously scheduled OV in a few weeks.

## 2016-09-16 NOTE — Patient Instructions (Addendum)
Great to see you.  Please take xanax as needed for panic attacks and insomnia.  Think about starting something like zoloft or remeron.  Keep your appointment with Dr. Lorelei Pont.

## 2016-09-16 NOTE — Progress Notes (Signed)
Subjective:   Patient ID: Anne Ferrell, female    DOB: [birth year], 83 y.o.   MRN: [id number]  Anne Ferrell is a pleasant 83 y.o. year old female who presents to clinic today with Anxiety  on 09/16/2016  HPI:  Unfortunately her sister has terminal cancer and she lives in [place]. Since finding out her terminal status, Anne Ferrell has been understandably having bouts of anxiety. Not sleeping well. Decreased appetite. Denies feeling depressed.  Has been trying to walk more. No SI or HI.  Went to her cardiologist and her BP and pulse were elevated. She has a h/o white coat HTN so she has been checking her BP and pulse at home. Brings log in and BPs are all normotensive range and pulse is normal as well.  Current Outpatient Prescriptions on File Prior to Visit  Medication Sig Dispense Refill  . alendronate (FOSAMAX) 70 MG tablet Take 1 tablet (70 mg total) by mouth every 7 (seven) days. Take with a full glass of water on an empty stomach. 12 tablet 3  . atenolol (TENORMIN) 25 MG tablet TAKE ONE TABLET BY MOUTH ONCE DAILY 90 tablet 1  . Calcium Carbonate (CALCIUM 600 PO) Take 1 each by mouth daily.    . Cholecalciferol (VITAMIN D) 2000 units tablet Take 2,000 Units by mouth daily.    . Lutein 6 MG CAPS Take 1 capsule by mouth daily.    . Magnesium 250 MG TABS Take 1 tablet by mouth daily.    . vitamin A 8000 UNIT capsule Take 8,000 Units by mouth daily.    . vitamin C (ASCORBIC ACID) 500 MG tablet Take 500 mg by mouth daily.     No current facility-administered medications on file prior to visit.     Allergies  Allergen Reactions  . Anise Flavor Other (See Comments)    Feels like she is going to pass out  . Licorice Flavor [Flavoring Agent] Palpitations    Past Medical History:  Diagnosis Date  . Arthritis   . Diverticulitis   . Glaucoma   . Heart murmur   . High blood pressure   . Osteoporosis 10/05/2015  . Vitamin D deficiency 09/18/2015    Past Surgical  History:  Procedure Laterality Date  . ABDOMINAL HYSTERECTOMY  2008  . cataract surgery Bilateral 2011  . TONSILLECTOMY AND ADENOIDECTOMY  1941    Family History  Problem Relation Age of Onset  . Cancer Father        prostate  . Colon cancer Neg Hx     Social History   Social History  . Marital status: Widowed    Spouse name: N/A  . Number of children: N/A  . Years of education: N/A   Occupational History  . Not on file.   Social History Main Topics  . Smoking status: Never Smoker  . Smokeless tobacco: Never Used  . Alcohol use 6.0 oz/week    10 Glasses of wine per week  . Drug use: No  . Sexual activity: Not on file   Other Topics Concern  . Not on file   Social History Narrative   Mother of Koleen Nimrod   The PMH, PSH, Social History, Family History, Medications, and allergies have been reviewed in [facility], and have been updated if relevant.   Review of Systems  Constitutional: Positive for appetite change.  Respiratory: Negative.   Cardiovascular: Negative.   Psychiatric/Behavioral: Positive for sleep disturbance. Negative for agitation, behavioral problems, confusion, decreased concentration, dysphoric mood,  hallucinations, self-injury and suicidal ideas. The patient is nervous/anxious. The patient is not hyperactive.   All other systems reviewed and are negative.      Objective:    BP (!) 150/88   Pulse 78   Temp 98.1 F (36.7 C)   Ht 5' 9.5" (1.765 m)   Wt 134 lb (60.8 kg)   SpO2 98%   BMI 19.50 kg/m   Wt Readings from Last 3 Encounters:  09/16/16 134 lb (60.8 kg)  10/05/15 135 lb 8 oz (61.5 kg)  09/18/15 137 lb 8 oz (62.4 kg)     Physical Exam  Constitutional: She is oriented to person, place, and time. She appears well-developed and well-nourished. No distress.  HENT:  Head: Normocephalic and atraumatic.  Eyes: Conjunctivae are normal.  Cardiovascular: Normal rate.   Pulmonary/Chest: Effort normal.  Neurological: She is alert and  oriented to person, place, and time. No cranial nerve deficit.  Skin: Skin is warm and dry. She is not diaphoretic.  Psychiatric:  Tearful but very appropriate  Nursing note and vitals reviewed.         Assessment & Plan:   Adjustment disorder with anxiety No Follow-up on file.

## 2016-09-16 NOTE — Progress Notes (Signed)
Pre visit review using our clinic review tool, if applicable. No additional management support is needed unless otherwise documented below in the visit note. 

## 2016-10-03 ENCOUNTER — Ambulatory Visit (INDEPENDENT_AMBULATORY_CARE_PROVIDER_SITE_OTHER): Payer: Medicare Other

## 2016-10-03 VITALS — BP 128/70 | HR 68 | Temp 97.9°F | Ht 69.5 in | Wt 131.2 lb

## 2016-10-03 DIAGNOSIS — E7849 Other hyperlipidemia: Secondary | ICD-10-CM

## 2016-10-03 DIAGNOSIS — E559 Vitamin D deficiency, unspecified: Secondary | ICD-10-CM | POA: Diagnosis not present

## 2016-10-03 DIAGNOSIS — E784 Other hyperlipidemia: Secondary | ICD-10-CM

## 2016-10-03 DIAGNOSIS — Z0000 Encounter for general adult medical examination without abnormal findings: Secondary | ICD-10-CM

## 2016-10-03 DIAGNOSIS — R030 Elevated blood-pressure reading, without diagnosis of hypertension: Secondary | ICD-10-CM | POA: Diagnosis not present

## 2016-10-03 LAB — CBC WITH DIFFERENTIAL/PLATELET
BASOS PCT: 1.1 % (ref 0.0–3.0)
Basophils Absolute: 0.1 10*3/uL (ref 0.0–0.1)
EOS PCT: 1 % (ref 0.0–5.0)
Eosinophils Absolute: 0.1 10*3/uL (ref 0.0–0.7)
HCT: 42.1 % (ref 36.0–46.0)
HEMOGLOBIN: 14.1 g/dL (ref 12.0–15.0)
Lymphocytes Relative: 13.7 % (ref 12.0–46.0)
Lymphs Abs: 0.8 10*3/uL (ref 0.7–4.0)
MCHC: 33.5 g/dL (ref 30.0–36.0)
MCV: 92.8 fl (ref 78.0–100.0)
MONO ABS: 0.6 10*3/uL (ref 0.1–1.0)
MONOS PCT: 9 % (ref 3.0–12.0)
Neutro Abs: 4.6 10*3/uL (ref 1.4–7.7)
Neutrophils Relative %: 75.2 % (ref 43.0–77.0)
Platelets: 251 10*3/uL (ref 150.0–400.0)
RBC: 4.53 Mil/uL (ref 3.87–5.11)
RDW: 13.2 % (ref 11.5–15.5)
WBC: 6.1 10*3/uL (ref 4.0–10.5)

## 2016-10-03 LAB — COMPREHENSIVE METABOLIC PANEL
ALBUMIN: 4.3 g/dL (ref 3.5–5.2)
ALK PHOS: 52 U/L (ref 39–117)
ALT: 19 U/L (ref 0–35)
AST: 27 U/L (ref 0–37)
BUN: 19 mg/dL (ref 6–23)
CALCIUM: 9.5 mg/dL (ref 8.4–10.5)
CO2: 28 mEq/L (ref 19–32)
Chloride: 105 mEq/L (ref 96–112)
Creatinine, Ser: 0.79 mg/dL (ref 0.40–1.20)
GFR: 74.01 mL/min (ref >60.00)
Glucose, Bld: 98 mg/dL (ref 70–99)
POTASSIUM: 4.3 meq/L (ref 3.5–5.1)
SODIUM: 140 meq/L (ref 135–145)
TOTAL PROTEIN: 6.6 g/dL (ref 6.0–8.3)
Total Bilirubin: 0.7 mg/dL (ref 0.2–1.2)

## 2016-10-03 LAB — TSH: TSH: 0.96 u[IU]/mL (ref 0.35–4.50)

## 2016-10-03 LAB — LIPID PANEL
Cholesterol: 227 mg/dL — ABNORMAL HIGH (ref 0–200)
HDL: 82.7 mg/dL (ref >39.00)
LDL Cholesterol: 130 mg/dL — ABNORMAL HIGH (ref 0–99)
NONHDL: 144.63
Total CHOL/HDL Ratio: 3
Triglycerides: 72 mg/dL (ref 0.0–149.0)
VLDL: 14.4 mg/dL (ref 0.0–40.0)

## 2016-10-03 LAB — VITAMIN D 25 HYDROXY (VIT D DEFICIENCY, FRACTURES): VITD: 35.52 ng/mL (ref 30.00–100.00)

## 2016-10-03 NOTE — Progress Notes (Signed)
PCP notes:   Health maintenance:  Mammogram - addressed; pt plans to schedule future appt  Abnormal screenings:   Hearing - failed  Patient concerns:   None  Nurse concerns:  None  Next PCP appt:   10/09/16 @ 1030

## 2016-10-03 NOTE — Progress Notes (Signed)
I reviewed health advisor's note, was available for consultation, and agree with documentation and plan.   Signed,  Domingo Fuson T. Terese Heier, MD  

## 2016-10-03 NOTE — Progress Notes (Signed)
Pre visit review using our clinic review tool, if applicable. No additional management support is needed unless otherwise documented below in the visit note. 

## 2016-10-03 NOTE — Progress Notes (Signed)
Subjective:   Anne Ferrell is a 83 y.o. female who presents for Medicare Annual (Subsequent) preventive examination.  Review of Systems:  N/A Cardiac Risk Factors include: advanced age (>13men, >64 women);dyslipidemia     Objective:     Vitals: BP 128/70 (BP Location: Right Arm, Patient Position: Sitting, Cuff Size: Normal)   Pulse 68   Temp 97.9 F (36.6 C) (Oral)   Ht 5' 9.5" (1.765 m) Comment: no shoes  Wt 131 lb 4 oz (59.5 kg)   SpO2 97%   BMI 19.10 kg/m   Body mass index is 19.1 kg/m.   Tobacco History  Smoking Status  . Never Smoker  Smokeless Tobacco  . Never Used     Counseling given: No   Past Medical History:  Diagnosis Date  . Arthritis   . Diverticulitis   . Glaucoma   . Heart murmur   . High blood pressure   . Osteoporosis 10/05/2015  . Vitamin D deficiency 09/18/2015   Past Surgical History:  Procedure Laterality Date  . ABDOMINAL HYSTERECTOMY  2008  . cataract surgery Bilateral 2011  . TONSILLECTOMY AND ADENOIDECTOMY  1941   Family History  Problem Relation Age of Onset  . Cancer Father        prostate  . Colon cancer Neg Hx    History  Sexual Activity  . Sexual activity: Not on file    Outpatient Encounter Prescriptions as of 10/03/2016  Medication Sig  . alendronate (FOSAMAX) 70 MG tablet Take 1 tablet (70 mg total) by mouth every 7 (seven) days. Take with a full glass of water on an empty stomach.  . ALPRAZolam (XANAX) 0.25 MG tablet Take 1 tablet (0.25 mg total) by mouth 3 (three) times daily as needed for anxiety or sleep.  Marland Kitchen atenolol (TENORMIN) 25 MG tablet TAKE ONE TABLET BY MOUTH ONCE DAILY  . Calcium Carbonate (CALCIUM 600 PO) Take 1 each by mouth daily.  . Cholecalciferol (VITAMIN D) 2000 units tablet Take 2,000 Units by mouth daily.  . Lutein 6 MG CAPS Take 1 capsule by mouth daily.  . Magnesium 250 MG TABS Take 1 tablet by mouth daily.  Marland Kitchen VITAMIN A PO Take 5,000 Units by mouth daily.  . [DISCONTINUED] vitamin A 8000  UNIT capsule Take 8,000 Units by mouth daily.  . [DISCONTINUED] vitamin C (ASCORBIC ACID) 500 MG tablet Take 500 mg by mouth daily.   No facility-administered encounter medications on file as of 10/03/2016.     Activities of Daily Living In your present state of health, do you have any difficulty performing the following activities: 10/03/2016  Hearing? Y  Vision? N  Difficulty concentrating or making decisions? N  Walking or climbing stairs? N  Dressing or bathing? N  Doing errands, shopping? N  Preparing Food and eating ? N  Using the Toilet? N  In the past six months, have you accidently leaked urine? N  Do you have problems with loss of bowel control? N  Managing your Medications? N  Managing your Finances? N  Housekeeping or managing your Housekeeping? N  Some recent data might be hidden    Patient Care Team: Owens Loffler, MD as PCP - General (Family Medicine) Leandrew Koyanagi, MD as Referring Physician (Ophthalmology) Dionisio David, MD as Consulting Physician (Cardiology) Verda Cumins, MD as Consulting Physician (Rehabilitation)    Assessment:     Hearing Screening   125Hz  250Hz  500Hz  1000Hz  2000Hz  3000Hz  4000Hz  6000Hz  8000Hz   Right ear:   40  0 0  0    Left ear:   40 40 40  0    Vision Screening Comments: Last vision exam in May 2018; dx of glacuoma   Exercise Activities and Dietary recommendations Current Exercise Habits: Home exercise routine, Type of exercise: walking, Time (Minutes): 30, Frequency (Times/Week): 3, Weekly Exercise (Minutes/Week): 90, Intensity: Mild, Exercise limited by: orthopedic condition(s)  Goals    . Increase physical activity          Starting 10/03/16, I will continue to walk at least 30 min 3 days per week.       Fall Risk Fall Risk  10/03/2016 09/12/2015 07/06/2014 04/12/2013  Falls in the past year? No No No No   Depression Screen PHQ 2/9 Scores 10/03/2016 09/12/2015 07/06/2014 04/12/2013  PHQ - 2 Score 0 0 0 0     Cognitive  Function MMSE - Mini Mental State Exam 10/03/2016 09/12/2015  Orientation to time 5 5  Orientation to Place 5 5  Registration 3 3  Attention/ Calculation 0 0  Recall 3 3  Language- name 2 objects 0 0  Language- repeat 1 1  Language- follow 3 step command 3 3  Language- read & follow direction 0 0  Write a sentence 0 0  Copy design 0 0  Total score 20 20       PLEASE NOTE: A Mini-Cog screen was completed. Maximum score is 20. A value of 0 denotes this part of Folstein MMSE was not completed or the patient failed this part of the Mini-Cog screening.   Mini-Cog Screening Orientation to Time - Max 5 pts Orientation to Place - Max 5 pts Registration - Max 3 pts Recall - Max 3 pts Language Repeat - Max 1 pts Language Follow 3 Step Command - Max 3 pts   Immunization History  Administered Date(s) Administered  . Influenza,inj,Quad PF,36+ Mos 04/12/2013, 03/10/2014, 03/29/2015, 03/05/2016  . Pneumococcal Conjugate-13 09/12/2015  . Pneumococcal Polysaccharide-23 04/12/2013   Screening Tests Health Maintenance  Topic Date Due  . MAMMOGRAM  09/19/2017 (Originally 09/20/2016)  . INFLUENZA VACCINE  11/27/2016  . TETANUS/TDAP  06/13/2025  . DEXA SCAN  Completed  . PNA vac Low Risk Adult  Completed      Plan:     I have personally reviewed and addressed the Medicare Annual Wellness questionnaire and have noted the following in the patient's chart:  A. Medical and social history B. Use of alcohol, tobacco or illicit drugs  C. Current medications and supplements D. Functional ability and status E.  Nutritional status F.  Physical activity G. Advance directives H. List of other physicians I.  Hospitalizations, surgeries, and ER visits in previous 12 months J.  [place] to include hearing, vision, cognitive, depression L. Referrals and appointments - none  In addition, I have reviewed and discussed with patient certain preventive protocols, quality metrics, and best  practice recommendations. A written personalized care plan for preventive services as well as general preventive health recommendations were provided to patient.  See attached scanned questionnaire for additional information.   Signed,   Lindell Noe, MHA, BS, LPN Health Coach

## 2016-10-03 NOTE — Patient Instructions (Signed)
Ms. Borunda , Thank you for taking time to come for your Medicare Wellness Visit. I appreciate your ongoing commitment to your health goals. Please review the following plan we discussed and let me know if I can assist you in the future.   These are the goals we discussed: Goals    . Increase physical activity          Starting 10/03/16, I will continue to walk at least 30 min 3 days per week.        This is a list of the screening recommended for you and due dates:  Health Maintenance  Topic Date Due  . Mammogram  09/19/2017*  . Flu Shot  11/27/2016  . Tetanus Vaccine  06/13/2025  . DEXA scan (bone density measurement)  Completed  . Pneumonia vaccines  Completed  *Topic was postponed. The date shown is not the original due date.   Preventive Care for Adults  A healthy lifestyle and preventive care can promote health and wellness. Preventive health guidelines for adults include the following key practices.  . A routine yearly physical is a good way to check with your health care provider about your health and preventive screening. It is a chance to share any concerns and updates on your health and to receive a thorough exam.  . Visit your dentist for a routine exam and preventive care every 6 months. Brush your teeth twice a day and floss once a day. Good oral hygiene prevents tooth decay and gum disease.  . The frequency of eye exams is based on your age, health, family medical history, use  of contact lenses, and other factors. Follow your health care provider's ecommendations for frequency of eye exams.  . Eat a healthy diet. Foods like vegetables, fruits, whole grains, low-fat dairy products, and lean protein foods contain the nutrients you need without too many calories. Decrease your intake of foods high in solid fats, added sugars, and salt. Eat the right amount of calories for you. Get information about a proper diet from your health care provider, if necessary.  . Regular  physical exercise is one of the most important things you can do for your health. Most adults should get at least 150 minutes of moderate-intensity exercise (any activity that increases your heart rate and causes you to sweat) each week. In addition, most adults need muscle-strengthening exercises on 2 or more days a week.  Silver Sneakers may be a benefit available to you. To determine eligibility, you may visit the website: www.silversneakers.com or contact program at (225)582-6797 Mon-Fri between 8AM-8PM.   . Maintain a healthy weight. The body mass index (BMI) is a screening tool to identify possible weight problems. It provides an estimate of body fat based on height and weight. Your health care provider can find your BMI and can help you achieve or maintain a healthy weight.   For adults 20 years and older: ? A BMI below 18.5 is considered underweight. ? A BMI of 18.5 to 24.9 is normal. ? A BMI of 25 to 29.9 is considered overweight. ? A BMI of 30 and above is considered obese.   . Maintain normal blood lipids and cholesterol levels by exercising and minimizing your intake of saturated fat. Eat a balanced diet with plenty of fruit and vegetables. Blood tests for lipids and cholesterol should begin at age 35 and be repeated every 5 years. If your lipid or cholesterol levels are high, you are over 50, or you are at  high risk for heart disease, you may need your cholesterol levels checked more frequently. Ongoing high lipid and cholesterol levels should be treated with medicines if diet and exercise are not working.  . If you smoke, find out from your health care provider how to quit. If you do not use tobacco, please do not start.  . If you choose to drink alcohol, please do not consume more than 2 drinks per day. One drink is considered to be 12 ounces (355 mL) of beer, 5 ounces (148 mL) of wine, or 1.5 ounces (44 mL) of liquor.  . If you are 31-44 years old, ask your health care provider if  you should take aspirin to prevent strokes.  . Use sunscreen. Apply sunscreen liberally and repeatedly throughout the day. You should seek shade when your shadow is shorter than you. Protect yourself by wearing long sleeves, pants, a wide-brimmed hat, and sunglasses year round, whenever you are outdoors.  . Once a month, do a whole body skin exam, using a mirror to look at the skin on your back. Tell your health care provider of new moles, moles that have irregular borders, moles that are larger than a pencil eraser, or moles that have changed in shape or color.

## 2016-10-08 ENCOUNTER — Other Ambulatory Visit: Payer: Self-pay | Admitting: Family Medicine

## 2016-10-08 DIAGNOSIS — Z1231 Encounter for screening mammogram for malignant neoplasm of breast: Secondary | ICD-10-CM

## 2016-10-09 ENCOUNTER — Ambulatory Visit (INDEPENDENT_AMBULATORY_CARE_PROVIDER_SITE_OTHER): Payer: Medicare Other | Admitting: Family Medicine

## 2016-10-09 ENCOUNTER — Encounter: Payer: Self-pay | Admitting: Family Medicine

## 2016-10-09 VITALS — BP 140/78 | HR 75 | Temp 98.6°F | Ht 69.5 in | Wt 133.8 lb

## 2016-10-09 DIAGNOSIS — E559 Vitamin D deficiency, unspecified: Secondary | ICD-10-CM | POA: Diagnosis not present

## 2016-10-09 DIAGNOSIS — I10 Essential (primary) hypertension: Secondary | ICD-10-CM

## 2016-10-09 NOTE — Progress Notes (Signed)
Dr. Frederico Hamman T. Anjel Perfetti, MD, [facility] [place], [address] Phone: [phone] Fax: [phone]  10/09/2016  Patient: Anne Ferrell, MRN: [id number], DOB: 09/12/1932, 83 y.o.  Primary Physician:  Owens Loffler, MD   Chief Complaint  Patient presents with  . Annual Exam    Part 2   Subjective:   Anne Ferrell is a 83 y.o. very pleasant female patient who presents with the following:  F/u to medicare wellness.   Reviewed all labs face to face.  Doing well.  HTN: Tolerating all medications without side effects Stable and at goal No CP, no sob. No HA.  BP Readings from Last 3 Encounters:  10/09/16 140/78  10/03/16 128/70  09/16/16 (!) 063/01    Basic Metabolic Panel:    Component Value Date/Time   NA 140 10/03/2016 1018   K 4.3 10/03/2016 1018   CL 105 10/03/2016 1018   CO2 28 10/03/2016 1018   BUN 19 10/03/2016 1018   CREATININE 0.79 10/03/2016 1018   GLUCOSE 98 10/03/2016 1018   CALCIUM 9.5 10/03/2016 1018    Vit D is normal on 2000 units a day  Past Medical History, Surgical History, Social History, Family History, Problem List, Medications, and Allergies have been reviewed and updated if relevant.  Patient Active Problem List   Diagnosis Date Noted  . Adjustment disorder with anxiety 09/16/2016  . Osteoporosis 10/05/2015  . Vitamin D deficiency 09/18/2015  . High blood pressure   . Glaucoma   . Diverticulitis   . Arthritis     Past Medical History:  Diagnosis Date  . Arthritis   . Diverticulitis   . Glaucoma   . Heart murmur   . High blood pressure   . Osteoporosis 10/05/2015  . Vitamin D deficiency 09/18/2015    Past Surgical History:  Procedure Laterality Date  . ABDOMINAL HYSTERECTOMY  2008  . cataract surgery Bilateral 2011  . TONSILLECTOMY AND ADENOIDECTOMY  1941    Social History   Social History  . Marital status: Widowed    Spouse name: N/A  . Number of  children: N/A  . Years of education: N/A   Occupational History  . Not on file.   Social History Main Topics  . Smoking status: Never Smoker  . Smokeless tobacco: Never Used  . Alcohol use 4.8 oz/week    8 Glasses of wine per week  . Drug use: No  . Sexual activity: Not on file   Other Topics Concern  . Not on file   Social History Narrative   Mother of Anne Ferrell    Family History  Problem Relation Age of Onset  . Cancer Father        prostate  . Colon cancer Neg Hx     Allergies  Allergen Reactions  . Anise Flavor Other (See Comments)    Feels like she is going to pass out  . Licorice Flavor [Flavoring Agent] Palpitations    Medication list reviewed and updated in full in [facility].   GEN: No acute illnesses, no fevers, chills. GI: No n/v/d, eating normally Pulm: No SOB Interactive and getting along well at home.  Otherwise, ROS is as per the HPI.  Objective:   BP 140/78   Pulse 75   Temp 98.6 F (37 C) (Oral)   Ht 5' 9.5" (1.765 m)   Wt 133 lb 12 oz (60.7 kg)   BMI 19.47 kg/m  GEN: WDWN, NAD, Non-toxic, A & O x 3 HEENT: Atraumatic, Normocephalic. Neck supple. No masses, No LAD. Ears and Nose: No external deformity. CV: RRR, No M/G/R. No JVD. No thrill. No extra heart sounds. PULM: CTA B, no wheezes, crackles, rhonchi. No retractions. No resp. distress. No accessory muscle use. EXTR: No c/c/e NEURO Normal gait.  PSYCH: Normally interactive. Conversant. Not depressed or anxious appearing.  Calm demeanor.   Laboratory and Imaging Data: Results for orders placed or performed in visit on 10/03/16  Vitamin D, 25-hydroxy  Result Value Ref Range   VITD 35.52 30.00 - 100.00 ng/mL  CBC with Differential/Platelet  Result Value Ref Range   WBC 6.1 4.0 - 10.5 K/uL   RBC 4.53 3.87 - 5.11 Mil/uL   Hemoglobin 14.1 12.0 - 15.0 g/dL   HCT 42.1 36.0 - 46.0 %   MCV 92.8 78.0 - 100.0 fl   MCHC 33.5 30.0 - 36.0 g/dL   RDW 13.2 11.5 - 15.5 %    Platelets 251.0 150.0 - 400.0 K/uL   Neutrophils Relative % 75.2 43.0 - 77.0 %   Lymphocytes Relative 13.7 12.0 - 46.0 %   Monocytes Relative 9.0 3.0 - 12.0 %   Eosinophils Relative 1.0 0.0 - 5.0 %   Basophils Relative 1.1 0.0 - 3.0 %   Neutro Abs 4.6 1.4 - 7.7 K/uL   Lymphs Abs 0.8 0.7 - 4.0 K/uL   Monocytes Absolute 0.6 0.1 - 1.0 K/uL   Eosinophils Absolute 0.1 0.0 - 0.7 K/uL   Basophils Absolute 0.1 0.0 - 0.1 K/uL  Comprehensive metabolic panel  Result Value Ref Range   Sodium 140 135 - 145 mEq/L   Potassium 4.3 3.5 - 5.1 mEq/L   Chloride 105 96 - 112 mEq/L   CO2 28 19 - 32 mEq/L   Glucose, Bld 98 70 - 99 mg/dL   BUN 19 6 - 23 mg/dL   Creatinine, Ser 0.79 0.40 - 1.20 mg/dL   Total Bilirubin 0.7 0.2 - 1.2 mg/dL   Alkaline Phosphatase 52 39 - 117 U/L   AST 27 0 - 37 U/L   ALT 19 0 - 35 U/L   Total Protein 6.6 6.0 - 8.3 g/dL   Albumin 4.3 3.5 - 5.2 g/dL   Calcium 9.5 8.4 - 10.5 mg/dL   GFR 74.01 >60.00 mL/min  TSH  Result Value Ref Range   TSH 0.96 0.35 - 4.50 uIU/mL  Lipid Panel  Result Value Ref Range   Cholesterol 227 (H) 0 - 200 mg/dL   Triglycerides 72.0 0.0 - 149.0 mg/dL   HDL 82.70 >39.00 mg/dL   VLDL 14.4 0.0 - 40.0 mg/dL   LDL Cholesterol 130 (H) 0 - 99 mg/dL   Total CHOL/HDL Ratio 3    NonHDL 144.63      Assessment and Plan:   Essential hypertension  Vitamin D deficiency  She is doing remarkably well at 84.  I would make no changes.  Follow-up: No Follow-up on file.  Future Appointments Date Time Provider [place]  10/28/2016 11:20 AM [facility]  10/10/2017 8:15 AM Eustace Pen, LPN [facility]  10/13/2017 10:30 AM Julyan Gales, Frederico Hamman, MD [facility]    Signed,  Maud Deed. Courney Garrod, MD   Allergies as of 10/09/2016      Reactions   Anise Flavor Other (See Comments)   Feels like she is going to pass out   Licorice Flavor [flavoring Agent] Palpitations      Medication List  Accurate as of  10/09/16 10:49 AM. Always use your most recent med list.          alendronate 70 MG tablet Commonly known as:  FOSAMAX Take 1 tablet (70 mg total) by mouth every 7 (seven) days. Take with a full glass of water on an empty stomach.   ALPRAZolam 0.25 MG tablet Commonly known as:  XANAX Take 1 tablet (0.25 mg total) by mouth 3 (three) times daily as needed for anxiety or sleep.   atenolol 25 MG tablet Commonly known as:  TENORMIN TAKE ONE TABLET BY MOUTH ONCE DAILY   CALCIUM 600 PO Take 1 each by mouth daily.   Lutein 6 MG Caps Take 1 capsule by mouth daily.   Magnesium 250 MG Tabs Take 1 tablet by mouth daily.   VITAMIN A PO Take 5,000 Units by mouth daily.   Vitamin D 2000 units tablet Take 2,000 Units by mouth daily.

## 2016-10-25 ENCOUNTER — Other Ambulatory Visit: Payer: Self-pay | Admitting: Family Medicine

## 2016-10-25 DIAGNOSIS — H401132 Primary open-angle glaucoma, bilateral, moderate stage: Secondary | ICD-10-CM | POA: Diagnosis not present

## 2016-10-25 DIAGNOSIS — L821 Other seborrheic keratosis: Secondary | ICD-10-CM | POA: Diagnosis not present

## 2016-10-25 DIAGNOSIS — D225 Melanocytic nevi of trunk: Secondary | ICD-10-CM | POA: Diagnosis not present

## 2016-10-25 DIAGNOSIS — D2262 Melanocytic nevi of left upper limb, including shoulder: Secondary | ICD-10-CM | POA: Diagnosis not present

## 2016-10-25 DIAGNOSIS — D2272 Melanocytic nevi of left lower limb, including hip: Secondary | ICD-10-CM | POA: Diagnosis not present

## 2016-10-28 ENCOUNTER — Ambulatory Visit
Admission: RE | Admit: 2016-10-28 | Discharge: 2016-10-28 | Disposition: A | Discharge status: Home / Self Care | Payer: Medicare Other | Source: Ambulatory Visit | Attending: Family Medicine | Admitting: Family Medicine

## 2016-10-28 DIAGNOSIS — Z1231 Encounter for screening mammogram for malignant neoplasm of breast: Secondary | ICD-10-CM | POA: Diagnosis not present

## 2016-11-25 ENCOUNTER — Other Ambulatory Visit: Payer: Self-pay

## 2016-12-11 ENCOUNTER — Other Ambulatory Visit: Payer: Self-pay | Admitting: Family Medicine

## 2017-02-21 DIAGNOSIS — H401112 Primary open-angle glaucoma, right eye, moderate stage: Secondary | ICD-10-CM | POA: Diagnosis not present

## 2017-03-06 ENCOUNTER — Ambulatory Visit (INDEPENDENT_AMBULATORY_CARE_PROVIDER_SITE_OTHER): Payer: Medicare Other

## 2017-03-06 DIAGNOSIS — Z23 Encounter for immunization: Secondary | ICD-10-CM | POA: Diagnosis not present

## 2017-03-10 ENCOUNTER — Ambulatory Visit: Payer: Self-pay | Admitting: *Deleted

## 2017-03-10 NOTE — Telephone Encounter (Signed)
Appt scheduled with Dr. Lorelei Pont on 03/12/17

## 2017-03-10 NOTE — Telephone Encounter (Signed)
B/p readings up to 164/104. Stress from losing her sister to cancer. Adivce care given, checking b/p and writing them down to take to the office.  Reason for Disposition . Systolic BP  >= 833 OR Diastolic >= 383  Answer Assessment - Initial Assessment Questions 1. BLOOD PRESSURE: "What is the blood pressure?" "Did you take at least two measurements 5 minutes apart?"     149/89, 164/104 2. ONSET: "When did you take your blood pressure?"     now 3. HOW: "How did you obtain the blood pressure?" (e.g., visiting nurse, automatic home BP monitor)     Home b/p monitor 4. HISTORY: "Do you have a history of high blood pressure?"     yes 5. MEDICATIONS: "Are you taking any medications for blood pressure?" "Have you missed any doses recently?"     Yes and missed one dose 6. OTHER SYMPTOMS: "Do you have any symptoms?" (e.g., headache, chest pain, blurred vision, difficulty breathing, weakness)     no 7. PREGNANCY: "Is there any chance you are pregnant?" "When was your last menstrual period?"   no  Protocols used: HIGH BLOOD PRESSURE-A-AH

## 2017-03-12 ENCOUNTER — Ambulatory Visit (INDEPENDENT_AMBULATORY_CARE_PROVIDER_SITE_OTHER): Payer: Medicare Other | Admitting: Family Medicine

## 2017-03-12 ENCOUNTER — Encounter: Payer: Self-pay | Admitting: Family Medicine

## 2017-03-12 ENCOUNTER — Other Ambulatory Visit: Payer: Self-pay

## 2017-03-12 VITALS — BP 154/87 | HR 77 | Temp 98.3°F | Ht 69.5 in | Wt 132.2 lb

## 2017-03-12 DIAGNOSIS — I10 Essential (primary) hypertension: Secondary | ICD-10-CM | POA: Diagnosis not present

## 2017-03-12 DIAGNOSIS — F4321 Adjustment disorder with depressed mood: Secondary | ICD-10-CM | POA: Diagnosis not present

## 2017-03-12 DIAGNOSIS — R630 Anorexia: Secondary | ICD-10-CM

## 2017-03-12 MED ORDER — MIRTAZAPINE 15 MG PO TABS: 15.0000 mg | ORAL_TABLET | Freq: Every day | ORAL | 5 refills | Status: DC

## 2017-03-12 MED ORDER — ATENOLOL 50 MG PO TABS: 50.0000 mg | ORAL_TABLET | Freq: Every day | ORAL | 3 refills | Status: DC

## 2017-03-12 NOTE — Progress Notes (Signed)
Dr. Frederico Hamman T. Lakeena Downie, MD, [facility] [place], [address] Phone: [phone] Fax: [phone]  03/12/2017  Patient: Anne Ferrell, MRN: [id number], DOB: 08-20-1932, 83 y.o.  Primary Physician:  Owens Loffler, MD   Chief Complaint  Patient presents with  . Blood Pressure Issues   Subjective:   Anne Ferrell is a 83 y.o. very pleasant female patient who presents with the following:  Patient has been checking her blood pressures at home and she is been having some variable blood pressures, but she has had some as high in his 335 systolic as well as 456Y diastolic.  She is basically asymptomatic from these, but she has been fairly diligent in checking her pressures.  All of them are at least on the edge of high.  She's been on the same dose of atenolol for more than a decade..  She also had some grief and her sister passed away earlier this year, she still is struggling with dad and she has good support structure here with her daughter and her family, but few other friends.  She also thinks she is had a decreased appetite.  She doesn't think that she is really truly depressed, but she does feel little bit anxious.  grief  Past Medical History, Surgical History, Social History, Family History, Problem List, Medications, and Allergies have been reviewed and updated if relevant.  Patient Active Problem List   Diagnosis Date Noted  . Adjustment disorder with anxiety 09/16/2016  . Osteoporosis 10/05/2015  . Vitamin D deficiency 09/18/2015  . High blood pressure   . Glaucoma   . Diverticulitis   . Arthritis     Past Medical History:  Diagnosis Date  . Arthritis   . Diverticulitis   . Glaucoma   . Heart murmur   . High blood pressure   . Osteoporosis 10/05/2015  . Vitamin D deficiency 09/18/2015    Past Surgical History:  Procedure Laterality Date  . ABDOMINAL HYSTERECTOMY  2008  . cataract surgery  Bilateral 2011  . TONSILLECTOMY AND ADENOIDECTOMY  1941    Social History   Socioeconomic History  . Marital status: Widowed    Spouse name: Not on file  . Number of children: Not on file  . Years of education: Not on file  . Highest education level: Not on file  Social Needs  . Financial resource strain: Not on file  . Food insecurity - worry: Not on file  . Food insecurity - inability: Not on file  . Transportation needs - medical: Not on file  . Transportation needs - non-medical: Not on file  Occupational History  . Not on file  Tobacco Use  . Smoking status: Never Smoker  . Smokeless tobacco: Never Used  Substance and Sexual Activity  . Alcohol use: Yes    Alcohol/week: 4.8 oz    Types: 8 Glasses of wine per week  . Drug use: No  . Sexual activity: Not on file  Other Topics Concern  . Not on file  Social History Narrative   Mother of Koleen Nimrod    Family History  Problem Relation Age of Onset  . Cancer Father        prostate  . Colon cancer Neg Hx     Allergies  Allergen Reactions  . Anise Flavor Other (See Comments)    Feels like she is going to pass out  . Licorice Flavor [Flavoring Agent] Palpitations    Medication  list reviewed and updated in full in [facility].   GEN: No acute illnesses, no fevers, chills. GI: No n/v/d, eating normally Pulm: No SOB Interactive and getting along well at home.  Otherwise, ROS is as per the HPI.  Objective:   BP (!) 154/87   Pulse 77   Temp 98.3 F (36.8 C) (Oral)   Ht 5' 9.5" (1.765 m)   Wt 132 lb 4 oz (60 kg)   BMI 19.25 kg/m   GEN: WDWN, NAD, Non-toxic, A & O x 3 HEENT: Atraumatic, Normocephalic. Neck supple. No masses, No LAD. Ears and Nose: No external deformity. CV: RRR, No M/G/R. No JVD. No thrill. No extra heart sounds. PULM: CTA B, no wheezes, crackles, rhonchi. No retractions. No resp. distress. No accessory muscle use. EXTR: No c/c/e NEURO Normal gait.  PSYCH: Normally interactive.  Conversant. Not depressed or anxious appearing.  Calm demeanor.   Laboratory and Imaging Data:  Assessment and Plan:   Essential hypertension  Decreased appetite  Grief reaction  Increase atenolol dosing to 50 mg.  Regularly check pressures at home.  Contact me if still elevated.  Some of the appetite may be secondary to grief, and the patient also has some anxiety, and it's hard to know how much of this is all related.  I'm going to place her on a low-dose of Remeron to help with sleep and appetite, and I think that will also help with her anxiety.  Follow-up: No Follow-up on file.  Future Appointments  Date Time Provider [place]  10/10/2017  8:15 AM Eustace Pen, LPN [facility]  8/84/1660 10:30 AM Suellyn Meenan, Frederico Hamman, MD [facility]    Meds ordered this encounter  Medications  . mirtazapine (REMERON) 15 MG tablet    Sig: Take 1 tablet (15 mg total) at bedtime by mouth.    Dispense:  30 tablet    Refill:  5  . atenolol (TENORMIN) 50 MG tablet    Sig: Take 1 tablet (50 mg total) daily by mouth.    Dispense:  90 tablet    Refill:  3   Medications Discontinued During This Encounter  Medication Reason  . alendronate (FOSAMAX) 70 MG tablet Completed Course  . atenolol (TENORMIN) 25 MG tablet Reorder   No orders of the defined types were placed in this encounter.   Signed,  Maud Deed. Braxxton Stoudt, MD   Allergies as of 03/12/2017      Reactions   Anise Flavor Other (See Comments)   Feels like she is going to pass out   Licorice Flavor [flavoring Agent] Palpitations      Medication List        Accurate as of 03/12/17 11:59 PM. Always use your most recent med list.          ALPRAZolam 0.25 MG tablet Commonly known as:  XANAX Take 1 tablet (0.25 mg total) by mouth 3 (three) times daily as needed for anxiety or sleep.   atenolol 50 MG tablet Commonly known as:  TENORMIN Take 1 tablet (50 mg total) daily by mouth.   CALCIUM 600 PO Take 1 each by  mouth daily.   Lutein 6 MG Caps Take 1 capsule by mouth daily.   Magnesium 250 MG Tabs Take 1 tablet by mouth daily.   mirtazapine 15 MG tablet Commonly known as:  REMERON Take 1 tablet (15 mg total) at bedtime by mouth.   VITAMIN A PO Take 5,000 Units by mouth daily.   Vitamin  D 2000 units tablet Take 2,000 Units by mouth daily.

## 2017-03-13 ENCOUNTER — Encounter: Payer: Self-pay | Admitting: Family Medicine

## 2017-04-20 IMAGING — CR DG CHEST 1V PORT
1 series · 1 of 1 positions shown · non-contrast
Comparison: None.

CLINICAL DATA: Dizziness

EXAM:
PORTABLE CHEST 1 VIEW

[portable]
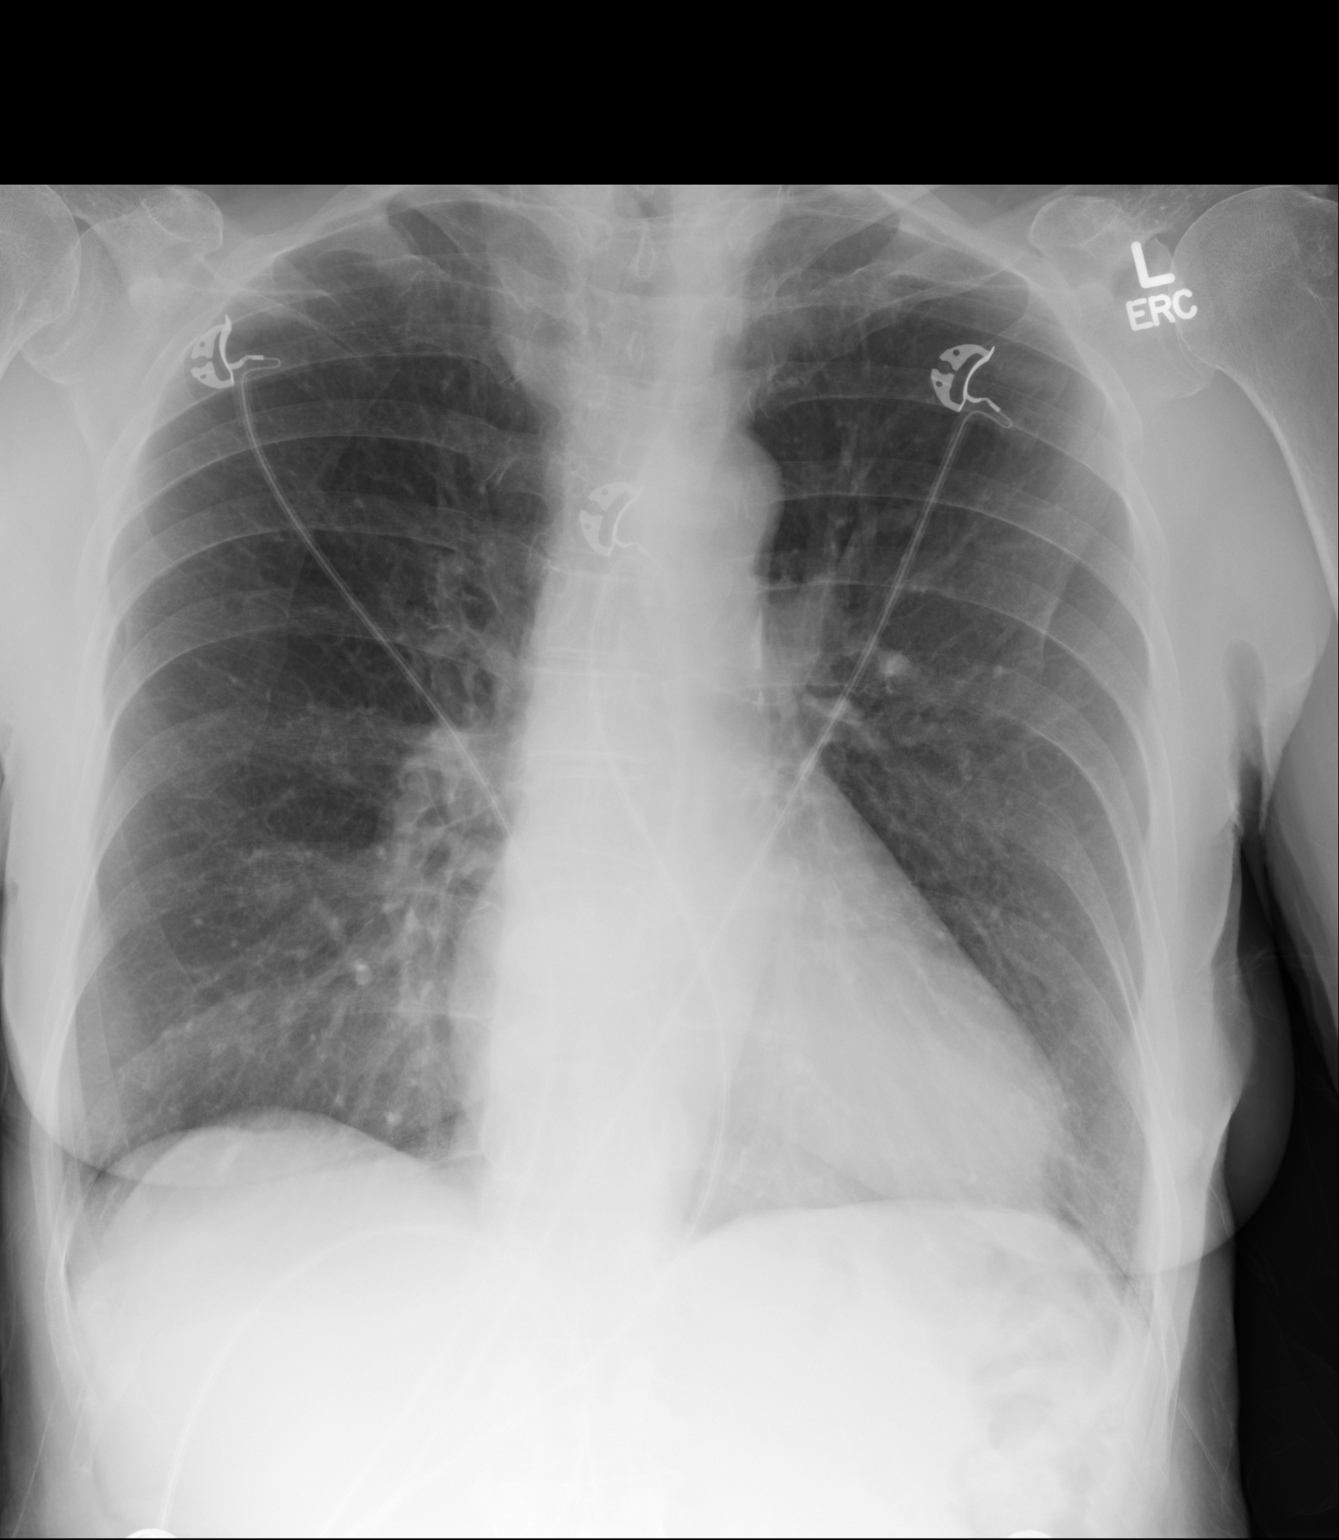

[1 of 1 positions shown; findings below may reference images not displayed]

FINDINGS: Lungs are clear. Heart size and pulmonary vascularity are within
normal limits. No adenopathy. No bone lesions.
IMPRESSION: No edema or consolidation.

## 2017-06-11 DIAGNOSIS — J069 Acute upper respiratory infection, unspecified: Secondary | ICD-10-CM | POA: Diagnosis not present

## 2017-07-24 DIAGNOSIS — H401132 Primary open-angle glaucoma, bilateral, moderate stage: Secondary | ICD-10-CM | POA: Diagnosis not present

## 2017-07-31 DIAGNOSIS — H401131 Primary open-angle glaucoma, bilateral, mild stage: Secondary | ICD-10-CM | POA: Diagnosis not present

## 2017-09-16 DIAGNOSIS — I351 Nonrheumatic aortic (valve) insufficiency: Secondary | ICD-10-CM | POA: Diagnosis not present

## 2017-09-16 DIAGNOSIS — I10 Essential (primary) hypertension: Secondary | ICD-10-CM | POA: Diagnosis not present

## 2017-09-16 DIAGNOSIS — I340 Nonrheumatic mitral (valve) insufficiency: Secondary | ICD-10-CM | POA: Diagnosis not present

## 2017-10-07 ENCOUNTER — Telehealth: Payer: Self-pay

## 2017-10-07 DIAGNOSIS — E559 Vitamin D deficiency, unspecified: Secondary | ICD-10-CM

## 2017-10-07 DIAGNOSIS — E7800 Pure hypercholesterolemia, unspecified: Secondary | ICD-10-CM

## 2017-10-07 NOTE — Telephone Encounter (Signed)
2019 CPE labs for PCP review and approval.

## 2017-10-07 NOTE — Telephone Encounter (Signed)
agree

## 2017-10-10 ENCOUNTER — Ambulatory Visit: Payer: Self-pay

## 2017-10-10 ENCOUNTER — Ambulatory Visit (INDEPENDENT_AMBULATORY_CARE_PROVIDER_SITE_OTHER): Payer: Medicare Other

## 2017-10-10 VITALS — BP 110/60 | HR 75 | Temp 97.6°F | Ht 69.5 in | Wt 132.5 lb

## 2017-10-10 DIAGNOSIS — Z0000 Encounter for general adult medical examination without abnormal findings: Secondary | ICD-10-CM | POA: Diagnosis not present

## 2017-10-10 DIAGNOSIS — E7800 Pure hypercholesterolemia, unspecified: Secondary | ICD-10-CM

## 2017-10-10 DIAGNOSIS — E559 Vitamin D deficiency, unspecified: Secondary | ICD-10-CM | POA: Diagnosis not present

## 2017-10-10 LAB — CBC WITH DIFFERENTIAL/PLATELET
BASOS PCT: 1.2 % (ref 0.0–3.0)
Basophils Absolute: 0.1 10*3/uL (ref 0.0–0.1)
EOS ABS: 0.1 10*3/uL (ref 0.0–0.7)
Eosinophils Relative: 1.4 % (ref 0.0–5.0)
HEMATOCRIT: 42.3 % (ref 36.0–46.0)
Hemoglobin: 14.2 g/dL (ref 12.0–15.0)
LYMPHS PCT: 14.4 % (ref 12.0–46.0)
Lymphs Abs: 0.7 10*3/uL (ref 0.7–4.0)
MCHC: 33.6 g/dL (ref 30.0–36.0)
MCV: 93.4 fl (ref 78.0–100.0)
Monocytes Absolute: 0.6 10*3/uL (ref 0.1–1.0)
Monocytes Relative: 10.6 % (ref 3.0–12.0)
NEUTROS ABS: 3.8 10*3/uL (ref 1.4–7.7)
NEUTROS PCT: 72.4 % (ref 43.0–77.0)
PLATELETS: 213 10*3/uL (ref 150.0–400.0)
RBC: 4.53 Mil/uL (ref 3.87–5.11)
RDW: 13.3 % (ref 11.5–15.5)
WBC: 5.2 10*3/uL (ref 4.0–10.5)

## 2017-10-10 LAB — TSH: TSH: 1.29 u[IU]/mL (ref 0.35–4.50)

## 2017-10-10 LAB — COMPREHENSIVE METABOLIC PANEL
ALT: 17 U/L (ref 0–35)
AST: 24 U/L (ref 0–37)
Albumin: 4.3 g/dL (ref 3.5–5.2)
Alkaline Phosphatase: 54 U/L (ref 39–117)
BUN: 19 mg/dL (ref 6–23)
CALCIUM: 9.5 mg/dL (ref 8.4–10.5)
CHLORIDE: 104 meq/L (ref 96–112)
CO2: 30 meq/L (ref 19–32)
CREATININE: 0.83 mg/dL (ref 0.40–1.20)
GFR: 69.73 mL/min (ref >60.00)
GLUCOSE: 99 mg/dL (ref 70–99)
Potassium: 4.3 mEq/L (ref 3.5–5.1)
Sodium: 141 mEq/L (ref 135–145)
Total Bilirubin: 0.7 mg/dL (ref 0.2–1.2)
Total Protein: 6.5 g/dL (ref 6.0–8.3)

## 2017-10-10 LAB — LIPID PANEL
CHOLESTEROL: 226 mg/dL — AB (ref 0–200)
HDL: 87.9 mg/dL (ref >39.00)
LDL CALC: 122 mg/dL — AB (ref 0–99)
NonHDL: 137.84
Total CHOL/HDL Ratio: 3
Triglycerides: 77 mg/dL (ref 0.0–149.0)
VLDL: 15.4 mg/dL (ref 0.0–40.0)

## 2017-10-10 LAB — VITAMIN D 25 HYDROXY (VIT D DEFICIENCY, FRACTURES): VITD: 40.31 ng/mL (ref 30.00–100.00)

## 2017-10-10 NOTE — Patient Instructions (Addendum)
Anne Ferrell , Thank you for taking time to come for your Medicare Wellness Visit. I appreciate your ongoing commitment to your health goals. Please review the following plan we discussed and let me know if I can assist you in the future.   These are the goals we discussed: Goals    . Increase physical activity     Starting 10/10/2017, I will continue to walk at least 30 minutes 5 days per week.        This is a list of the screening recommended for you and due dates:  Health Maintenance  Topic Date Due  . Mammogram  10/28/2017  . Flu Shot  11/27/2017  . Tetanus Vaccine  06/13/2025  . DEXA scan (bone density measurement)  Completed  . Pneumonia vaccines  Completed   Preventive Care for Adults  A healthy lifestyle and preventive care can promote health and wellness. Preventive health guidelines for adults include the following key practices.  . A routine yearly physical is a good way to check with your health care provider about your health and preventive screening. It is a chance to share any concerns and updates on your health and to receive a thorough exam.  . Visit your dentist for a routine exam and preventive care every 6 months. Brush your teeth twice a day and floss once a day. Good oral hygiene prevents tooth decay and gum disease.  . The frequency of eye exams is based on your age, health, family medical history, use  of contact lenses, and other factors. Follow your health care provider's recommendations for frequency of eye exams.  . Eat a healthy diet. Foods like vegetables, fruits, whole grains, low-fat dairy products, and lean protein foods contain the nutrients you need without too many calories. Decrease your intake of foods high in solid fats, added sugars, and salt. Eat the right amount of calories for you. Get information about a proper diet from your health care provider, if necessary.  . Regular physical exercise is one of the most important things you can do  for your health. Most adults should get at least 150 minutes of moderate-intensity exercise (any activity that increases your heart rate and causes you to sweat) each week. In addition, most adults need muscle-strengthening exercises on 2 or more days a week.  Silver Sneakers may be a benefit available to you. To determine eligibility, you may visit the website: www.silversneakers.com or contact program at 973-417-2516 Mon-Fri between 8AM-8PM.   . Maintain a healthy weight. The body mass index (BMI) is a screening tool to identify possible weight problems. It provides an estimate of body fat based on height and weight. Your health care provider can find your BMI and can help you achieve or maintain a healthy weight.   For adults 20 years and older: ? A BMI below 18.5 is considered underweight. ? A BMI of 18.5 to 24.9 is normal. ? A BMI of 25 to 29.9 is considered overweight. ? A BMI of 30 and above is considered obese.   . Maintain normal blood lipids and cholesterol levels by exercising and minimizing your intake of saturated fat. Eat a balanced diet with plenty of fruit and vegetables. Blood tests for lipids and cholesterol should begin at age 41 and be repeated every 5 years. If your lipid or cholesterol levels are high, you are over 50, or you are at high risk for heart disease, you may need your cholesterol levels checked more frequently. Ongoing high lipid and  cholesterol levels should be treated with medicines if diet and exercise are not working.  . If you smoke, find out from your health care provider how to quit. If you do not use tobacco, please do not start.  . If you choose to drink alcohol, please do not consume more than 2 drinks per day. One drink is considered to be 12 ounces (355 mL) of beer, 5 ounces (148 mL) of wine, or 1.5 ounces (44 mL) of liquor.  . If you are 47-33 years old, ask your health care provider if you should take aspirin to prevent strokes.  . Use sunscreen.  Apply sunscreen liberally and repeatedly throughout the day. You should seek shade when your shadow is shorter than you. Protect yourself by wearing long sleeves, pants, a wide-brimmed hat, and sunglasses year round, whenever you are outdoors.  . Once a month, do a whole body skin exam, using a mirror to look at the skin on your back. Tell your health care provider of new moles, moles that have irregular borders, moles that are larger than a pencil eraser, or moles that have changed in shape or color.

## 2017-10-10 NOTE — Progress Notes (Signed)
PCP notes:   Health maintenance:  No gaps identified.  Abnormal screenings:   Hearing - failed  Hearing Screening   125Hz  250Hz  500Hz  1000Hz  2000Hz  3000Hz  4000Hz  6000Hz  8000Hz   Right ear:   40 40 0  40    Left ear:   40 40 40  0     Patient concerns:   None  Nurse concerns:  None  Next PCP appt:   10/13/17 @ 1020

## 2017-10-10 NOTE — Progress Notes (Signed)
Subjective:   Anne Ferrell is a 84 y.o. female who presents for Medicare Annual (Subsequent) preventive examination.  Review of Systems:  N/A Cardiac Risk Factors include: advanced age (>74men, >44 women);dyslipidemia     Objective:     Vitals: BP 110/60 (BP Location: Right Arm, Patient Position: Sitting, Cuff Size: Normal)   Pulse 75   Temp 97.6 F (36.4 C) (Oral)   Ht 5' 9.5" (1.765 m) Comment: no shoes  Wt 132 lb 8 oz (60.1 kg)   SpO2 98%   BMI 19.29 kg/m   Body mass index is 19.29 kg/m.  Advanced Directives 10/10/2017 10/03/2016 09/12/2015 02/24/2015  Does Patient Have a Medical Advance Directive? Yes Yes Yes No  Type of Paramedic of [place];Living will [place];Living will [place];Living will -  Does patient want to make changes to medical advance directive? - - No - Patient declined -  Copy of [place] in Chart? No - copy requested No - copy requested No - copy requested -  Would patient like information on creating a medical advance directive? - - - No - patient declined information    Tobacco Social History   Tobacco Use  Smoking Status Never Smoker  Smokeless Tobacco Never Used     Counseling given: No   Clinical Intake:  Pre-visit preparation completed: Yes  Pain : No/denies pain Pain Score: 0-No pain     Nutritional Status: BMI of 19-24  Normal Nutritional Risks: None Diabetes: No  How often do you need to have someone help you when you read instructions, pamphlets, or other written materials from your doctor or pharmacy?: 1 - Never What is the last grade level you completed in school?: educated in [place]?: No  Comments: pt is a widow and lives alone Information entered by :: [employer], LPN  Past Medical History:  Diagnosis Date  . Arthritis   . Diverticulitis   . Glaucoma   . Heart murmur   . High blood pressure   . Osteoporosis  10/05/2015  . Vitamin D deficiency 09/18/2015   Past Surgical History:  Procedure Laterality Date  . ABDOMINAL HYSTERECTOMY  2008  . cataract surgery Bilateral 2011  . TONSILLECTOMY AND ADENOIDECTOMY  1941   Family History  Problem Relation Age of Onset  . Cancer Father        prostate  . Colon cancer Neg Hx    Social History   Socioeconomic History  . Marital status: Widowed    Spouse name: Not on file  . Number of children: Not on file  . Years of education: Not on file  . Highest education level: Not on file  Occupational History  . Not on file  Social Needs  . Financial resource strain: Not on file  . Food insecurity:    Worry: Not on file    Inability: Not on file  . Transportation needs:    Medical: Not on file    Non-medical: Not on file  Tobacco Use  . Smoking status: Never Smoker  . Smokeless tobacco: Never Used  Substance and Sexual Activity  . Alcohol use: Yes    Alcohol/week: 4.2 oz    Types: 7 Glasses of wine per week  . Drug use: No  . Sexual activity: Not Currently  Lifestyle  . Physical activity:    Days per week: Not on file    Minutes per session: Not on file  . Stress:  Not on file  Relationships  . Social connections:    Talks on phone: Not on file    Gets together: Not on file    Attends religious service: Not on file    Active member of club or organization: Not on file    Attends meetings of clubs or organizations: Not on file    Relationship status: Not on file  Other Topics Concern  . Not on file  Social History Narrative   Mother of Anne Ferrell    Outpatient Encounter Medications as of 10/10/2017  Medication Sig  . atenolol (TENORMIN) 50 MG tablet Take 1 tablet (50 mg total) daily by mouth. (Patient taking differently: Take 25 mg by mouth daily. )  . Cholecalciferol (VITAMIN D) 2000 units tablet Take 2,000 Units by mouth daily.  . Magnesium 250 MG TABS Take 1 tablet by mouth daily.  Marland Kitchen VITAMIN A PO Take 5,000 Units by mouth daily.   . [DISCONTINUED] ALPRAZolam (XANAX) 0.25 MG tablet Take 1 tablet (0.25 mg total) by mouth 3 (three) times daily as needed for anxiety or sleep. (Patient taking differently: Take 0.25 mg daily as needed by mouth for anxiety or sleep. )  . [DISCONTINUED] Calcium Carbonate (CALCIUM 600 PO) Take 1 each by mouth daily.  . [DISCONTINUED] Lutein 6 MG CAPS Take 1 capsule by mouth daily.  . [DISCONTINUED] mirtazapine (REMERON) 15 MG tablet Take 1 tablet (15 mg total) at bedtime by mouth.   No facility-administered encounter medications on file as of 10/10/2017.     Activities of Daily Living In your present state of health, do you have any difficulty performing the following activities: 10/10/2017  Hearing? N  Vision? N  Difficulty concentrating or making decisions? N  Walking or climbing stairs? N  Dressing or bathing? N  Doing errands, shopping? N  Preparing Food and eating ? N  Using the Toilet? N  In the past six months, have you accidently leaked urine? N  Do you have problems with loss of bowel control? N  Managing your Medications? N  Managing your Finances? N  Housekeeping or managing your Housekeeping? N  Some recent data might be hidden    Patient Care Team: Owens Loffler, MD as PCP - General (Family Medicine) Leandrew Koyanagi, MD as Referring Physician (Ophthalmology) Dionisio David, MD as Consulting Physician (Cardiology) Verda Cumins, MD as Consulting Physician (Rehabilitation)    Assessment:   This is a routine wellness examination for Anne Ferrell.   Hearing Screening   125Hz  250Hz  500Hz  1000Hz  2000Hz  3000Hz  4000Hz  6000Hz  8000Hz   Right ear:   40 40 0  40    Left ear:   40 40 40  0    Vision Screening Comments: April 2019 @ Dr. Wallace Going    Exercise Activities and Dietary recommendations Current Exercise Habits: Home exercise routine, Type of exercise: walking, Time (Minutes): 30, Frequency (Times/Week): 5, Weekly Exercise (Minutes/Week): 150, Intensity: Mild,  Exercise limited by: None identified  Goals    . Increase physical activity     Starting 10/10/2017, I will continue to walk at least 30 minutes 5 days per week.        Fall Risk Fall Risk  10/10/2017 11/25/2016 10/03/2016 09/12/2015 07/06/2014  Falls in the past year? No No No No No  Comment - Emmi Telephone Survey: data to providers prior to load - - -   Depression Screen PHQ 2/9 Scores 10/10/2017 10/03/2016 09/12/2015 07/06/2014  PHQ - 2 Score 0 0 0 0  PHQ-  9 Score 0 - - -     Cognitive Function MMSE - Mini Mental State Exam 10/10/2017 10/03/2016 09/12/2015  Orientation to time 5 5 5   Orientation to Place 5 5 5   Registration 3 3 3   Attention/ Calculation 0 0 0  Recall 3 3 3   Language- name 2 objects 0 0 0  Language- repeat 1 1 1   Language- follow 3 step command 3 3 3   Language- read & follow direction 0 0 0  Write a sentence 0 0 0  Copy design 0 0 0  Total score 20 20 20        PLEASE NOTE: A Mini-Cog screen was completed. Maximum score is 20. A value of 0 denotes this part of [place] MMSE was not completed or the patient failed this part of the Mini-Cog screening.   Mini-Cog Screening Orientation to Time - Max 5 pts Orientation to Place - Max 5 pts Registration - Max 3 pts Recall - Max 3 pts Language Repeat - Max 1 pts Language Follow 3 Step Command - Max 3 pts   Immunization History  Administered Date(s) Administered  . Influenza,inj,Quad PF,6+ Mos 04/12/2013, 03/10/2014, 03/29/2015, 03/05/2016, 03/06/2017  . Pneumococcal Conjugate-13 09/12/2015  . Pneumococcal Polysaccharide-23 04/12/2013    Screening Tests Health Maintenance  Topic Date Due  . MAMMOGRAM  10/28/2017  . INFLUENZA VACCINE  11/27/2017  . TETANUS/TDAP  06/13/2025  . DEXA SCAN  Completed  . PNA vac Low Risk Adult  Completed       Plan:     I have personally reviewed, addressed, and noted the following in the patient's chart:  A. Medical and social history B. Use of alcohol, tobacco or illicit drugs   C. Current medications and supplements D. Functional ability and status E.  Nutritional status F.  Physical activity G. Advance directives H. List of other physicians I.  Hospitalizations, surgeries, and ER visits in previous 12 months J.  [place] to include hearing, vision, cognitive, depression L. Referrals and appointments - none  In addition, I have reviewed and discussed with patient certain preventive protocols, quality metrics, and best practice recommendations. A written personalized care plan for preventive services as well as general preventive health recommendations were provided to patient.  See attached scanned questionnaire for additional information.   Signed,   Lindell Noe, MHA, BS, LPN Health Coach

## 2017-10-13 ENCOUNTER — Ambulatory Visit (INDEPENDENT_AMBULATORY_CARE_PROVIDER_SITE_OTHER): Payer: Medicare Other | Admitting: Family Medicine

## 2017-10-13 ENCOUNTER — Other Ambulatory Visit: Payer: Self-pay

## 2017-10-13 ENCOUNTER — Encounter: Payer: Self-pay | Admitting: Family Medicine

## 2017-10-13 VITALS — BP 138/80 | HR 67 | Temp 98.5°F | Ht 69.5 in | Wt 132.0 lb

## 2017-10-13 DIAGNOSIS — E559 Vitamin D deficiency, unspecified: Secondary | ICD-10-CM

## 2017-10-13 DIAGNOSIS — R002 Palpitations: Secondary | ICD-10-CM | POA: Diagnosis not present

## 2017-10-13 DIAGNOSIS — K5792 Diverticulitis of intestine, part unspecified, without perforation or abscess without bleeding: Secondary | ICD-10-CM | POA: Diagnosis not present

## 2017-10-13 DIAGNOSIS — I10 Essential (primary) hypertension: Secondary | ICD-10-CM

## 2017-10-13 DIAGNOSIS — M810 Age-related osteoporosis without current pathological fracture: Secondary | ICD-10-CM

## 2017-10-13 MED ORDER — AMOXICILLIN-POT CLAVULANATE 875-125 MG PO TABS: 1.0000 | ORAL_TABLET | Freq: Two times a day (BID) | ORAL | 0 refills | Status: DC

## 2017-10-13 NOTE — Progress Notes (Signed)
I reviewed health advisor's note, was available for consultation, and agree with documentation and plan.  

## 2017-10-13 NOTE — Progress Notes (Signed)
Dr. Frederico Hamman T. Texanna Hilburn, MD, [facility] [place], [address] Phone: [phone] Fax: [phone]  10/13/2017  Patient: Anne Ferrell, MRN: [id number], DOB: 20-Jan-1933, 84 y.o.  Primary Physician:  Owens Loffler, MD   Chief Complaint  Patient presents with  . Annual Exam    Part 2   Subjective:   Anne Ferrell is a 84 y.o. very pleasant female patient who presents with the following:  She is globally healthy, doing well for age, functionally independent.  She does have some arthritis as well as glaucoma.  She really has no significant blood pressure at this point, but cardiology has placed her on some atenolol.  She does feel somewhat lightheaded, and she recently decreased her dose of atenolol to only 25 mg in the morning.  She questions whether she may be getting some palpitations or not.  Dr. Humphrey Rolls - cardiology.  Worried about some diverticulitis.   Wants an EKG  Past Medical History, Surgical History, Social History, Family History, Problem List, Medications, and Allergies have been reviewed and updated if relevant.  Patient Active Problem List   Diagnosis Date Noted  . Adjustment disorder with anxiety 09/16/2016  . Osteoporosis 10/05/2015  . Vitamin D deficiency 09/18/2015  . High blood pressure   . Glaucoma   . Diverticulitis   . Arthritis     Past Medical History:  Diagnosis Date  . Arthritis   . Diverticulitis   . Glaucoma   . Heart murmur   . High blood pressure   . Osteoporosis 10/05/2015  . Vitamin D deficiency 09/18/2015    Past Surgical History:  Procedure Laterality Date  . ABDOMINAL HYSTERECTOMY  2008  . cataract surgery Bilateral 2011  . TONSILLECTOMY AND ADENOIDECTOMY  1941    Social History   Socioeconomic History  . Marital status: Widowed    Spouse name: Not on file  . Number of children: Not on file  . Years of education: Not on file  . Highest education level:  Not on file  Occupational History  . Not on file  Social Needs  . Financial resource strain: Not on file  . Food insecurity:    Worry: Not on file    Inability: Not on file  . Transportation needs:    Medical: Not on file    Non-medical: Not on file  Tobacco Use  . Smoking status: Never Smoker  . Smokeless tobacco: Never Used  Substance and Sexual Activity  . Alcohol use: Yes    Alcohol/week: 4.2 oz    Types: 7 Glasses of wine per week  . Drug use: No  . Sexual activity: Not Currently  Lifestyle  . Physical activity:    Days per week: Not on file    Minutes per session: Not on file  . Stress: Not on file  Relationships  . Social connections:    Talks on phone: Not on file    Gets together: Not on file    Attends religious service: Not on file    Active member of club or organization: Not on file    Attends meetings of clubs or organizations: Not on file    Relationship status: Not on file  . Intimate partner violence:    Fear of current or ex partner: Not on file    Emotionally abused: Not on file    Physically abused: Not on file    Forced sexual activity: Not on file  Other  Topics Concern  . Not on file  Social History Narrative   Mother of Koleen Nimrod    Family History  Problem Relation Age of Onset  . Cancer Father        prostate  . Colon cancer Neg Hx     Allergies  Allergen Reactions  . Anise Flavor Other (See Comments)    Feels like she is going to pass out  . Licorice Flavor [Flavoring Agent] Palpitations    Medication list reviewed and updated in full in [facility].   GEN: No acute illnesses, no fevers, chills. GI: No n/v/d, eating normally Pulm: No SOB Interactive and getting along well at home.  Otherwise, ROS is as per the HPI.  Objective:   BP 138/80   Pulse 67   Temp 98.5 F (36.9 C) (Oral)   Ht 5' 9.5" (1.765 m)   Wt 132 lb (59.9 kg)   BMI 19.21 kg/m   GEN: WDWN, NAD, Non-toxic, A & O x 3 HEENT: Atraumatic,  Normocephalic. Neck supple. No masses, No LAD. Ears and Nose: No external deformity. CV: RRR, No M/G/R. No JVD. No thrill. No extra heart sounds. PULM: CTA B, no wheezes, crackles, rhonchi. No retractions. No resp. distress. No accessory muscle use. EXTR: No c/c/e NEURO Normal gait.  PSYCH: Normally interactive. Conversant. Not depressed or anxious appearing.  Calm demeanor.   Laboratory and Imaging Data: Results for orders placed or performed in visit on 10/10/17  Vitamin D, 25-hydroxy  Result Value Ref Range   VITD 40.31 30.00 - 100.00 ng/mL  Lipid Panel  Result Value Ref Range   Cholesterol 226 (H) 0 - 200 mg/dL   Triglycerides 77.0 0.0 - 149.0 mg/dL   HDL 87.90 >39.00 mg/dL   VLDL 15.4 0.0 - 40.0 mg/dL   LDL Cholesterol 122 (H) 0 - 99 mg/dL   Total CHOL/HDL Ratio 3    NonHDL 137.84   CBC with Differential/Platelet  Result Value Ref Range   WBC 5.2 4.0 - 10.5 K/uL   RBC 4.53 3.87 - 5.11 Mil/uL   Hemoglobin 14.2 12.0 - 15.0 g/dL   HCT 42.3 36.0 - 46.0 %   MCV 93.4 78.0 - 100.0 fl   MCHC 33.6 30.0 - 36.0 g/dL   RDW 13.3 11.5 - 15.5 %   Platelets 213.0 150.0 - 400.0 K/uL   Neutrophils Relative % 72.4 43.0 - 77.0 %   Lymphocytes Relative 14.4 12.0 - 46.0 %   Monocytes Relative 10.6 3.0 - 12.0 %   Eosinophils Relative 1.4 0.0 - 5.0 %   Basophils Relative 1.2 0.0 - 3.0 %   Neutro Abs 3.8 1.4 - 7.7 K/uL   Lymphs Abs 0.7 0.7 - 4.0 K/uL   Monocytes Absolute 0.6 0.1 - 1.0 K/uL   Eosinophils Absolute 0.1 0.0 - 0.7 K/uL   Basophils Absolute 0.1 0.0 - 0.1 K/uL  Comprehensive metabolic panel  Result Value Ref Range   Sodium 141 135 - 145 mEq/L   Potassium 4.3 3.5 - 5.1 mEq/L   Chloride 104 96 - 112 mEq/L   CO2 30 19 - 32 mEq/L   Glucose, Bld 99 70 - 99 mg/dL   BUN 19 6 - 23 mg/dL   Creatinine, Ser 0.83 0.40 - 1.20 mg/dL   Total Bilirubin 0.7 0.2 - 1.2 mg/dL   Alkaline Phosphatase 54 39 - 117 U/L   AST 24 0 - 37 U/L   ALT 17 0 - 35 U/L   Total Protein 6.5  6.0 - 8.3 g/dL    Albumin 4.3 3.5 - 5.2 g/dL   Calcium 9.5 8.4 - 10.5 mg/dL   GFR 69.73 >60.00 mL/min  TSH  Result Value Ref Range   TSH 1.29 0.35 - 4.50 uIU/mL     Assessment and Plan:   Palpitations - Plan: EKG 12-Lead  Diverticulitis  Vitamin D deficiency  Essential hypertension  Age-related osteoporosis without current pathological fracture  We will obtain records from the patient's prior physicians. (Cardiology)  EKG: Normal sinus rhythm. Normal axis, normal R wave progression, No acute ST elevation or depression. There do appear to be Q waves in II, III, avF that appear unchanged from prior EKG.  Otherwise, she is feeling well.  She does have a history of diverticulitis, and she is going to go to [place] with her family in the next few weeks, so I think it reasonable to have some antibiotics on hand.  Otherwise she is doing great.  Follow-up: No follow-ups on file.  Meds ordered this encounter  Medications  . amoxicillin-clavulanate (AUGMENTIN) 875-125 MG tablet    Sig: Take 1 tablet by mouth 2 (two) times daily for 7 days.    Dispense:  14 tablet    Refill:  0   Medications Discontinued During This Encounter  Medication Reason  . atenolol (TENORMIN) 50 MG tablet Change in therapy   Orders Placed This Encounter  Procedures  . EKG 12-Lead    Signed,  Elija Mccamish T. Juelz Claar, MD   Allergies as of 10/13/2017      Reactions   Anise Flavor Other (See Comments)   Feels like she is going to pass out   Licorice Flavor [flavoring Agent] Palpitations      Medication List        Accurate as of 10/13/17 11:59 PM. Always use your most recent med list.          amoxicillin-clavulanate 875-125 MG tablet Commonly known as:  AUGMENTIN Take 1 tablet by mouth 2 (two) times daily for 7 days.   atenolol 25 MG tablet Commonly known as:  TENORMIN Take 25 mg by mouth daily.   Magnesium 250 MG Tabs Take 1 tablet by mouth daily.   VITAMIN A PO Take 5,000 Units by mouth daily.     Vitamin D 2000 units tablet Take 2,000 Units by mouth daily.

## 2017-10-14 ENCOUNTER — Encounter: Payer: Self-pay | Admitting: Family Medicine

## 2017-10-24 DIAGNOSIS — L538 Other specified erythematous conditions: Secondary | ICD-10-CM | POA: Diagnosis not present

## 2017-10-24 DIAGNOSIS — D2262 Melanocytic nevi of left upper limb, including shoulder: Secondary | ICD-10-CM | POA: Diagnosis not present

## 2017-10-24 DIAGNOSIS — D2272 Melanocytic nevi of left lower limb, including hip: Secondary | ICD-10-CM | POA: Diagnosis not present

## 2017-10-24 DIAGNOSIS — D225 Melanocytic nevi of trunk: Secondary | ICD-10-CM | POA: Diagnosis not present

## 2017-10-24 DIAGNOSIS — D2261 Melanocytic nevi of right upper limb, including shoulder: Secondary | ICD-10-CM | POA: Diagnosis not present

## 2017-10-24 DIAGNOSIS — D2271 Melanocytic nevi of right lower limb, including hip: Secondary | ICD-10-CM | POA: Diagnosis not present

## 2017-10-24 DIAGNOSIS — L820 Inflamed seborrheic keratosis: Secondary | ICD-10-CM | POA: Diagnosis not present

## 2017-10-29 ENCOUNTER — Other Ambulatory Visit: Payer: Self-pay | Admitting: Family Medicine

## 2017-10-29 MED ORDER — AMOXICILLIN-POT CLAVULANATE 875-125 MG PO TABS: 1.0000 | ORAL_TABLET | Freq: Two times a day (BID) | ORAL | 0 refills | Status: AC

## 2017-10-29 NOTE — Telephone Encounter (Signed)
This is fine, please let us know if she worsens. There is risk in traveling to [place] with active diverticulitis. She will be in [place] I think - use their healthcare system if needed.   Sending in.

## 2017-10-29 NOTE — Telephone Encounter (Signed)
Ms. Klingel notified as instructed by telephone.

## 2017-10-29 NOTE — Telephone Encounter (Signed)
Copied from [place] [id number]. Topic: Quick Communication - See Telephone Encounter >> Oct 29, 2017 11:18 AM Conception Chancy, NT wrote: CRM for notification. See Telephone encounter for: 10/29/17.  Patient is calling and states she has diverticulitis. She states she was giving a precription of amoxicillin for when she travels to [place] on 11/04/17. Patient states she had a flare up on 10/28/17 and is having to use that prescription now. She would like to know can she get another script to take with her next week as a safety measure. Patient would like a call if this is called in or if it is not. Please advise.  [facility] [address], [place] - [place] [address] Phone: [phone] Fax: [phone]

## 2017-10-31 ENCOUNTER — Ambulatory Visit: Payer: Self-pay | Admitting: *Deleted

## 2017-10-31 ENCOUNTER — Encounter: Payer: Self-pay | Admitting: Family Medicine

## 2017-10-31 ENCOUNTER — Ambulatory Visit (INDEPENDENT_AMBULATORY_CARE_PROVIDER_SITE_OTHER): Payer: Medicare Other | Admitting: Family Medicine

## 2017-10-31 VITALS — BP 120/76 | HR 83 | Temp 97.9°F | Ht 69.5 in | Wt 129.2 lb

## 2017-10-31 DIAGNOSIS — F4322 Adjustment disorder with anxiety: Secondary | ICD-10-CM

## 2017-10-31 DIAGNOSIS — I38 Endocarditis, valve unspecified: Secondary | ICD-10-CM | POA: Insufficient documentation

## 2017-10-31 DIAGNOSIS — R000 Tachycardia, unspecified: Secondary | ICD-10-CM | POA: Diagnosis not present

## 2017-10-31 DIAGNOSIS — K5792 Diverticulitis of intestine, part unspecified, without perforation or abscess without bleeding: Secondary | ICD-10-CM

## 2017-10-31 MED ORDER — ALPRAZOLAM 0.25 MG PO TABS: 0.2500 mg | ORAL_TABLET | Freq: Two times a day (BID) | ORAL | 0 refills | Status: DC | PRN

## 2017-10-31 NOTE — Assessment & Plan Note (Signed)
Recovering from recent flare with 7d augmentin course.

## 2017-10-31 NOTE — Patient Instructions (Signed)
Heart is sounding good and regular today Increase fluid intake by 1-2 glasses a day over the next several days.  Let us know if recurrent racing heart.  I will print out prescription for xanax.

## 2017-10-31 NOTE — Progress Notes (Signed)
BP 120/76 (BP Location: Left Arm, Patient Position: Sitting, Cuff Size: Normal)   Pulse 83   Temp 97.9 F (36.6 C) (Oral)   Ht 5' 9.5" (1.765 m)   Wt 129 lb 4 oz (58.6 kg)   SpO2 96%   BMI 18.81 kg/m    CC: racing heart Subjective:    Patient ID: Anne Ferrell, female    DOB: Aug 19, 1932, 84 y.o.   MRN: [id number]  HPI: Anne Ferrell is a 84 y.o. female presenting on 10/31/2017 for Elevated HR (C/o elevated HR this morning. Noticed it was 120 while eating breakfast. About 30 mins later, HR was 80. )   Here with daughter.   Currently undergoing treatment for diverticulitis (loose stools, cramping, low grade fever) - she started augmentin 7 d course on Monday. She thinks she is staying well hydrated with plenty of water.   Checked pulse this morning - up to 120, she overall felt well. After 30 min pulse returned to 80. Didn't feel dizzy or lightheaded, no chest pain or dyspnea.   Upcoming trip to [place] - under stress preparing for trip. Daughter asks about pt having xanax Rx on hand for flight. She has a bottle that expired 08/2017.   Relevant past medical, surgical, family and social history reviewed and updated as indicated. Interim medical history since our last visit reviewed. Allergies and medications reviewed and updated. Outpatient Medications Prior to Visit  Medication Sig Dispense Refill  . amoxicillin-clavulanate (AUGMENTIN) 875-125 MG tablet Take 1 tablet by mouth 2 (two) times daily for 7 days. 14 tablet 0  . atenolol (TENORMIN) 25 MG tablet Take 25 mg by mouth daily.  3  . Cholecalciferol (VITAMIN D) 2000 units tablet Take 2,000 Units by mouth daily.    . Magnesium 250 MG TABS Take 1 tablet by mouth daily.    Marland Kitchen VITAMIN A PO Take 5,000 Units by mouth daily.     No facility-administered medications prior to visit.      Per HPI unless specifically indicated in ROS section below Review of Systems     Objective:    BP 120/76 (BP Location: Left Arm, Patient  Position: Sitting, Cuff Size: Normal)   Pulse 83   Temp 97.9 F (36.6 C) (Oral)   Ht 5' 9.5" (1.765 m)   Wt 129 lb 4 oz (58.6 kg)   SpO2 96%   BMI 18.81 kg/m   Wt Readings from Last 3 Encounters:  10/31/17 129 lb 4 oz (58.6 kg)  10/13/17 132 lb (59.9 kg)  10/10/17 132 lb 8 oz (60.1 kg)    Physical Exam  Constitutional: She appears well-developed and well-nourished. No distress.  HENT:  Head: Normocephalic and atraumatic.  Mouth/Throat: Oropharynx is clear and moist. No oropharyngeal exudate.  Eyes: Pupils are equal, round, and reactive to light. Conjunctivae and EOM are normal.  Neck: Normal range of motion. Neck supple. No thyromegaly present.  Cardiovascular: Normal rate and regular rhythm.  Murmur (3/6 systolic throughout) heard. Pulmonary/Chest: Effort normal and breath sounds normal. No stridor. No respiratory distress. She has no wheezes. She has no rales.  Abdominal: Soft. Bowel sounds are normal. She exhibits no distension and no mass. There is no tenderness. There is no rebound and no guarding. No hernia.  Musculoskeletal: She exhibits no edema.  Lymphadenopathy:    She has no cervical adenopathy.  Skin: Skin is warm and dry. No rash noted.  Psychiatric: She has a normal mood and affect.  Nursing note and vitals reviewed.  Assessment & Plan:   Problem List Items Addressed This Visit    Valvular heart disease    Known mild regurg/stenosis on latest echo      Tachycardia - Primary    Isolated episode of tachycardia after she's been battling with diverticulitis flare this past week, which is actually getting better. Anticipate dehydration related tachycardia, reviewed management for this. Update if persistent or progressive. Should be ok for upcoming international travel. Pt and daughter agree with plan.       Diverticulitis    Recovering from recent flare with 7d augmentin course.       Adjustment disorder with anxiety    Per pt/daughter request, Rx printed  for xanax #10 for upcoming plane trip.  Initially prescribed after sister's death, last filled #30 2016-10-01, only took 2 over the course of the year.           Meds ordered this encounter  Medications  . ALPRAZolam (XANAX) 0.25 MG tablet    Sig: Take 1 tablet (0.25 mg total) by mouth 2 (two) times daily as needed for anxiety or sleep.    Dispense:  10 tablet    Refill:  0   No orders of the defined types were placed in this encounter.   Follow up plan: No follow-ups on file.  Ria Bush, MD

## 2017-10-31 NOTE — Assessment & Plan Note (Signed)
Known mild regurg/stenosis on latest echo

## 2017-10-31 NOTE — Telephone Encounter (Signed)
I spoke with pt and no CP and SOB,now heart rate is 80 but pt is supposed to go to [place] next week and is going to keep appt with Dr Darnell Level 10/31/17 at 12 noon.

## 2017-10-31 NOTE — Assessment & Plan Note (Addendum)
Per pt/daughter request, Rx printed for xanax #10 for upcoming plane trip.  Initially prescribed after sister's death, last filled #30 Oct 13, 2016, only took 2 over the course of the year.

## 2017-10-31 NOTE — Telephone Encounter (Signed)
Patient's daughter is calling - her mother has called with concerns of rapid heart rate this morning- her heart rate is reading 120 with her BP of 112/77. Patient is currently halfway through treatment for diverticulitis and taking antibiotic. She states she is still having loose stools and abdominal cramping- but her fever is gone and the sever pain is gone. During our conversation- her heart rate has slowed to 108. Appointment scheduled for 12:00 today. Reason for Disposition . Age > 60 years (Exception: brief heart beat symptoms that went away and now feels well)  Answer Assessment - Initial Assessment Questions 1. DESCRIPTION: "Please describe your heart rate or heart beat that you are having" (e.g., fast/slow, regular/irregular, skipped or extra beats, "palpitations")     Patient felt her heart rate was fast this morning 2. ONSET: "When did it start?" (Minutes, hours or days)      This morning 3. DURATION: "How long does it last" (e.g., seconds, minutes, hours)     This morning- anxiety- patient is supposed to go out of the county of [place] 4. PATTERN "Does it come and go, or has it been constant since it started?"  "Does it get worse with exertion?"   "Are you feeling it now?"     Patient has never felt this feeling before 5. TAP: "Using your hand, can you tap out what you are feeling on a chair or table in front of you, so that I can hear?" (Note: not all patients can do this)       Patient feels pulse is regular 6. HEART RATE: "Can you tell me your heart rate?" "How many beats in 15 seconds?"  (Note: not all patients can do this)       120- now 108 7. RECURRENT SYMPTOM: "Have you ever had this before?" If so, ask: "When was the last time?" and "What happened that time?"      No- not that she is sure of 8. CAUSE: "What do you think is causing the palpitations?"     anxiety 9. CARDIAC HISTORY: "Do you have any history of heart disease?" (e.g., heart attack, angina, bypass surgery,  angioplasty, arrhythmia)      mitral valve prolapse 10. OTHER SYMPTOMS: "Do you have any other symptoms?" (e.g., dizziness, chest pain, sweating, difficulty breathing)       no 11. PREGNANCY: "Is there any chance you are pregnant?" "When was your last menstrual period?"       n/a  Protocols used: HEART RATE AND HEARTBEAT QUESTIONS-A-AH

## 2017-10-31 NOTE — Assessment & Plan Note (Addendum)
Isolated episode of tachycardia after she's been battling with diverticulitis flare this past week, which is actually getting better. Anticipate dehydration related tachycardia, reviewed management for this. Update if persistent or progressive. Should be ok for upcoming international travel. Pt and daughter agree with plan.

## 2018-01-29 DIAGNOSIS — H401132 Primary open-angle glaucoma, bilateral, moderate stage: Secondary | ICD-10-CM | POA: Diagnosis not present

## 2018-01-29 DIAGNOSIS — H353131 Nonexudative age-related macular degeneration, bilateral, early dry stage: Secondary | ICD-10-CM | POA: Diagnosis not present

## 2018-02-26 ENCOUNTER — Ambulatory Visit (INDEPENDENT_AMBULATORY_CARE_PROVIDER_SITE_OTHER): Payer: Medicare Other | Admitting: Family Medicine

## 2018-02-26 DIAGNOSIS — Z23 Encounter for immunization: Secondary | ICD-10-CM | POA: Diagnosis not present

## 2018-03-11 ENCOUNTER — Encounter: Payer: Self-pay | Admitting: Emergency Medicine

## 2018-03-19 DIAGNOSIS — I10 Essential (primary) hypertension: Secondary | ICD-10-CM | POA: Diagnosis not present

## 2018-03-19 DIAGNOSIS — R002 Palpitations: Secondary | ICD-10-CM | POA: Diagnosis not present

## 2018-03-19 DIAGNOSIS — I351 Nonrheumatic aortic (valve) insufficiency: Secondary | ICD-10-CM | POA: Diagnosis not present

## 2018-03-19 DIAGNOSIS — I340 Nonrheumatic mitral (valve) insufficiency: Secondary | ICD-10-CM | POA: Diagnosis not present

## 2018-03-27 NOTE — Progress Notes (Signed)
signed

## 2018-05-01 DIAGNOSIS — M170 Bilateral primary osteoarthritis of knee: Secondary | ICD-10-CM | POA: Diagnosis not present

## 2018-05-01 DIAGNOSIS — M1712 Unilateral primary osteoarthritis, left knee: Secondary | ICD-10-CM | POA: Diagnosis not present

## 2018-05-01 DIAGNOSIS — M25561 Pain in right knee: Secondary | ICD-10-CM | POA: Diagnosis not present

## 2018-05-01 DIAGNOSIS — M1711 Unilateral primary osteoarthritis, right knee: Secondary | ICD-10-CM | POA: Diagnosis not present

## 2018-05-12 DIAGNOSIS — M1711 Unilateral primary osteoarthritis, right knee: Secondary | ICD-10-CM | POA: Diagnosis not present

## 2018-05-19 DIAGNOSIS — M1711 Unilateral primary osteoarthritis, right knee: Secondary | ICD-10-CM | POA: Diagnosis not present

## 2018-05-19 DIAGNOSIS — M25561 Pain in right knee: Secondary | ICD-10-CM | POA: Diagnosis not present

## 2018-05-26 DIAGNOSIS — M1711 Unilateral primary osteoarthritis, right knee: Secondary | ICD-10-CM | POA: Diagnosis not present

## 2018-05-26 DIAGNOSIS — M25561 Pain in right knee: Secondary | ICD-10-CM | POA: Diagnosis not present

## 2018-06-05 ENCOUNTER — Other Ambulatory Visit: Payer: Self-pay | Admitting: Family Medicine

## 2018-07-12 DIAGNOSIS — H43813 Vitreous degeneration, bilateral: Secondary | ICD-10-CM | POA: Diagnosis not present

## 2018-09-07 DIAGNOSIS — M545 Low back pain: Secondary | ICD-10-CM | POA: Diagnosis not present

## 2018-09-07 DIAGNOSIS — M47817 Spondylosis without myelopathy or radiculopathy, lumbosacral region: Secondary | ICD-10-CM | POA: Diagnosis not present

## 2018-10-01 DIAGNOSIS — I340 Nonrheumatic mitral (valve) insufficiency: Secondary | ICD-10-CM | POA: Diagnosis not present

## 2018-10-01 DIAGNOSIS — I351 Nonrheumatic aortic (valve) insufficiency: Secondary | ICD-10-CM | POA: Diagnosis not present

## 2018-10-01 DIAGNOSIS — I10 Essential (primary) hypertension: Secondary | ICD-10-CM | POA: Diagnosis not present

## 2018-10-28 ENCOUNTER — Ambulatory Visit: Payer: Self-pay

## 2018-10-28 ENCOUNTER — Other Ambulatory Visit: Payer: Self-pay

## 2018-10-28 ENCOUNTER — Other Ambulatory Visit (INDEPENDENT_AMBULATORY_CARE_PROVIDER_SITE_OTHER): Payer: Medicare Other

## 2018-10-28 DIAGNOSIS — E559 Vitamin D deficiency, unspecified: Secondary | ICD-10-CM | POA: Diagnosis not present

## 2018-10-28 DIAGNOSIS — Z79899 Other long term (current) drug therapy: Secondary | ICD-10-CM | POA: Diagnosis not present

## 2018-10-28 DIAGNOSIS — E785 Hyperlipidemia, unspecified: Secondary | ICD-10-CM | POA: Diagnosis not present

## 2018-10-28 LAB — CBC WITH DIFFERENTIAL/PLATELET
Basophils Absolute: 0.1 10*3/uL (ref 0.0–0.1)
Basophils Relative: 1.2 % (ref 0.0–3.0)
Eosinophils Absolute: 0.1 10*3/uL (ref 0.0–0.7)
Eosinophils Relative: 1.7 % (ref 0.0–5.0)
HCT: 41.4 % (ref 36.0–46.0)
Hemoglobin: 13.8 g/dL (ref 12.0–15.0)
Lymphocytes Relative: 15.7 % (ref 12.0–46.0)
Lymphs Abs: 0.8 10*3/uL (ref 0.7–4.0)
MCHC: 33.3 g/dL (ref 30.0–36.0)
MCV: 95.7 fl (ref 78.0–100.0)
Monocytes Absolute: 0.5 10*3/uL (ref 0.1–1.0)
Monocytes Relative: 10.6 % (ref 3.0–12.0)
Neutro Abs: 3.5 10*3/uL (ref 1.4–7.7)
Neutrophils Relative %: 70.8 % (ref 43.0–77.0)
Platelets: 225 10*3/uL (ref 150.0–400.0)
RBC: 4.32 Mil/uL (ref 3.87–5.11)
RDW: 13.6 % (ref 11.5–15.5)
WBC: 5 10*3/uL (ref 4.0–10.5)

## 2018-10-28 LAB — BASIC METABOLIC PANEL
BUN: 21 mg/dL (ref 6–23)
CO2: 29 mEq/L (ref 19–32)
Calcium: 9 mg/dL (ref 8.4–10.5)
Chloride: 106 mEq/L (ref 96–112)
Creatinine, Ser: 0.83 mg/dL (ref 0.40–1.20)
GFR: 65.44 mL/min (ref >60.00)
Glucose, Bld: 102 mg/dL — ABNORMAL HIGH (ref 70–99)
Potassium: 4.3 mEq/L (ref 3.5–5.1)
Sodium: 141 mEq/L (ref 135–145)

## 2018-10-28 LAB — HEPATIC FUNCTION PANEL
ALT: 18 U/L (ref 0–35)
AST: 24 U/L (ref 0–37)
Albumin: 4.2 g/dL (ref 3.5–5.2)
Alkaline Phosphatase: 58 U/L (ref 39–117)
Bilirubin, Direct: 0.1 mg/dL (ref 0.0–0.3)
Total Bilirubin: 0.7 mg/dL (ref 0.2–1.2)
Total Protein: 6.4 g/dL (ref 6.0–8.3)

## 2018-10-28 LAB — LIPID PANEL
Cholesterol: 216 mg/dL — ABNORMAL HIGH (ref 0–200)
HDL: 82.1 mg/dL (ref >39.00)
LDL Cholesterol: 119 mg/dL — ABNORMAL HIGH (ref 0–99)
NonHDL: 133.92
Total CHOL/HDL Ratio: 3
Triglycerides: 74 mg/dL (ref 0.0–149.0)
VLDL: 14.8 mg/dL (ref 0.0–40.0)

## 2018-10-28 LAB — TSH: TSH: 1.55 u[IU]/mL (ref 0.35–4.50)

## 2018-10-28 LAB — VITAMIN D 25 HYDROXY (VIT D DEFICIENCY, FRACTURES): VITD: 30.15 ng/mL (ref 30.00–100.00)

## 2018-11-01 NOTE — Progress Notes (Signed)
Anne Basaldua T. Everet Flagg, MD Primary Care and [place] at [facility] [place], [address] Phone: [phone]  FAX: [phone]  Anne Ferrell - 85 y.o. female  MRN [id number]  Date of Birth: [birth year]  Visit Date: 11/02/2018  PCP: Owens Loffler, MD  Referred by: Owens Loffler, MD  Chief Complaint  Patient presents with  . Medicare Wellness   Patient Care Team: Owens Loffler, MD as PCP - General (Family Medicine) Leandrew Koyanagi, MD as Referring Physician (Ophthalmology) Dionisio David, MD as Consulting Physician (Cardiology) Verda Cumins, MD as Consulting Physician (Rehabilitation) Subjective:   Anne Ferrell is a 85 y.o. pleasant patient who presents for a medicare wellness examination:  Health Maintenance Summary Reviewed and updated, unless pt declines services.  Tobacco History Reviewed. Non-smoker Alcohol: No concerns, no excessive use Exercise Habits: Some activity, rec at least 30 mins 5 times a week STD concerns: none Drug Use: None Birth control method: n/a Menses regular: n/a Lumps or breast concerns: no Breast Cancer Family History: no  Mitral valv.  Mitral valve - was on some different meds.  ? Calcification aorta without dilation - a procedure was suggested. Dr. Humphrey Rolls questions.  Wait.     Health Maintenance  Topic Date Due  . MAMMOGRAM  10/28/2017  . INFLUENZA VACCINE  11/28/2018  . TETANUS/TDAP  06/13/2025  . DEXA SCAN  Completed  . PNA vac Low Risk Adult  Completed   Immunization History  Administered Date(s) Administered  . Influenza,inj,Quad PF,6+ Mos 04/12/2013, 03/10/2014, 03/29/2015, 03/05/2016, 03/06/2017, 02/26/2018  . Pneumococcal Conjugate-13 09/12/2015  . Pneumococcal Polysaccharide-23 04/12/2013    Patient Active Problem List   Diagnosis Date Noted  . Tachycardia 10/31/2017  . Valvular heart disease 10/31/2017  . Adjustment disorder with anxiety  09/16/2016  . Osteoporosis 10/05/2015  . Vitamin D deficiency 09/18/2015  . High blood pressure   . Glaucoma   . Diverticulitis   . Arthritis    Past Medical History:  Diagnosis Date  . Arthritis   . Diverticulitis   . Glaucoma   . Heart murmur   . High blood pressure   . Osteoporosis 10/05/2015  . Vitamin D deficiency 09/18/2015   Past Surgical History:  Procedure Laterality Date  . ABDOMINAL HYSTERECTOMY  2008  . cataract surgery Bilateral 2011  . TONSILLECTOMY AND ADENOIDECTOMY  1941   Social History   Socioeconomic History  . Marital status: Widowed    Spouse name: Not on file  . Number of children: Not on file  . Years of education: Not on file  . Highest education level: Not on file  Occupational History  . Not on file  Social Needs  . Financial resource strain: Not on file  . Food insecurity    Worry: Not on file    Inability: Not on file  . Transportation needs    Medical: Not on file    Non-medical: Not on file  Tobacco Use  . Smoking status: Never Smoker  . Smokeless tobacco: Never Used  Substance and Sexual Activity  . Alcohol use: Yes    Alcohol/week: 7.0 standard drinks    Types: 7 Glasses of wine per week  . Drug use: No  . Sexual activity: Not Currently  Lifestyle  . Physical activity    Days per week: Not on file    Minutes per session: Not on file  . Stress: Not on file  Relationships  . Social connections  Talks on phone: Not on file    Gets together: Not on file    Attends religious service: Not on file    Active member of club or organization: Not on file    Attends meetings of clubs or organizations: Not on file    Relationship status: Not on file  . Intimate partner violence    Fear of current or ex partner: Not on file    Emotionally abused: Not on file    Physically abused: Not on file    Forced sexual activity: Not on file  Other Topics Concern  . Not on file  Social History Narrative   Mother of Koleen Nimrod   Family  History  Problem Relation Age of Onset  . Cancer Father        prostate  . Colon cancer Neg Hx    Allergies  Allergen Reactions  . Anise Flavor Other (See Comments)    Feels like she is going to pass out  . Licorice Flavor [Flavoring Agent] Palpitations    Medication list has been reviewed and updated.  Results for orders placed or performed in visit on 10/28/18  Lipid panel  Result Value Ref Range   Cholesterol 216 (H) 0 - 200 mg/dL   Triglycerides 74.0 0.0 - 149.0 mg/dL   HDL 82.10 >39.00 mg/dL   VLDL 14.8 0.0 - 40.0 mg/dL   LDL Cholesterol 119 (H) 0 - 99 mg/dL   Total CHOL/HDL Ratio 3    NonHDL 133.92   CBC with Differential/Platelet  Result Value Ref Range   WBC 5.0 4.0 - 10.5 K/uL   RBC 4.32 3.87 - 5.11 Mil/uL   Hemoglobin 13.8 12.0 - 15.0 g/dL   HCT 41.4 36.0 - 46.0 %   MCV 95.7 78.0 - 100.0 fl   MCHC 33.3 30.0 - 36.0 g/dL   RDW 13.6 11.5 - 15.5 %   Platelets 225.0 150.0 - 400.0 K/uL   Neutrophils Relative % 70.8 43.0 - 77.0 %   Lymphocytes Relative 15.7 12.0 - 46.0 %   Monocytes Relative 10.6 3.0 - 12.0 %   Eosinophils Relative 1.7 0.0 - 5.0 %   Basophils Relative 1.2 0.0 - 3.0 %   Neutro Abs 3.5 1.4 - 7.7 K/uL   Lymphs Abs 0.8 0.7 - 4.0 K/uL   Monocytes Absolute 0.5 0.1 - 1.0 K/uL   Eosinophils Absolute 0.1 0.0 - 0.7 K/uL   Basophils Absolute 0.1 0.0 - 0.1 K/uL  Hepatic function panel  Result Value Ref Range   Total Bilirubin 0.7 0.2 - 1.2 mg/dL   Bilirubin, Direct 0.1 0.0 - 0.3 mg/dL   Alkaline Phosphatase 58 39 - 117 U/L   AST 24 0 - 37 U/L   ALT 18 0 - 35 U/L   Total Protein 6.4 6.0 - 8.3 g/dL   Albumin 4.2 3.5 - 5.2 g/dL  Basic metabolic panel  Result Value Ref Range   Sodium 141 135 - 145 mEq/L   Potassium 4.3 3.5 - 5.1 mEq/L   Chloride 106 96 - 112 mEq/L   CO2 29 19 - 32 mEq/L   Glucose, Bld 102 (H) 70 - 99 mg/dL   BUN 21 6 - 23 mg/dL   Creatinine, Ser 0.83 0.40 - 1.20 mg/dL   Calcium 9.0 8.4 - 10.5 mg/dL   GFR 65.44 >60.00 mL/min  TSH   Result Value Ref Range   TSH 1.55 0.35 - 4.50 uIU/mL  VITAMIN D 25 Hydroxy (Vit-D Deficiency, Fractures)  Result  Value Ref Range   VITD 30.15 30.00 - 100.00 ng/mL    Objective:   BP 124/76   Pulse 75   Temp 98.1 F (36.7 C) (Temporal)   Ht 5' 9.5" (1.765 m)   Wt 128 lb 8 oz (58.3 kg)   SpO2 96%   BMI 18.70 kg/m  Fall Risk  11/02/2018 10/10/2017 11/25/2016 10/03/2016 09/12/2015  Falls in the past year? 0 No No No No  Comment - - Emmi Telephone Survey: data to providers prior to load - -   Ideal Body Weight: Weight in (lb) to have BMI = 25: 171.4  Hearing Screening   Method: Audiometry   '125Hz'$  '250Hz'$  '500Hz'$  '1000Hz'$  '2000Hz'$  '3000Hz'$  '4000Hz'$  '6000Hz'$  '8000Hz'$   Right ear:   '20 20 20  '$ 0    Left ear:   '20 20 20  '$ 0    Vision Screening Comments: Wears Glasses-Eye Exam in 08/2018 with [place] at [facility].  Goes twice a year to eye doctor. Depression screen [facility] 2/9 11/02/2018 10/10/2017 10/03/2016 09/12/2015 07/06/2014  Decreased Interest 0 0 0 0 0  Down, Depressed, Hopeless 0 0 0 0 0  PHQ - 2 Score 0 0 0 0 0  Altered sleeping - 0 - - -  Tired, decreased energy - 0 - - -  Change in appetite - 0 - - -  Feeling bad or failure about yourself  - 0 - - -  Trouble concentrating - 0 - - -  Moving slowly or fidgety/restless - 0 - - -  Suicidal thoughts - 0 - - -  PHQ-9 Score - 0 - - -  Difficult doing work/chores - Not difficult at all - - -     GEN: well developed, well nourished, no acute distress Eyes: conjunctiva and lids normal, PERRLA, EOMI ENT: TM clear, nares clear, oral exam WNL Neck: supple, no lymphadenopathy, no thyromegaly, no JVD Pulm: clear to auscultation and percussion, respiratory effort normal CV: regular rate and rhythm, S1-S2, no murmur, rub or gallop, no bruits Chest: no scars, masses, no lumps BREAST: breast exam declined GI: soft, non-tender; no hepatosplenomegaly, masses; active bowel sounds all quadrants GU: GU exam declined Lymph: no cervical, axillary or inguinal  adenopathy MSK: gait normal, muscle tone and strength WNL, no joint swelling, effusions, discoloration, crepitus  SKIN: clear, good turgor, color WNL, no rashes, lesions, or ulcerations Neuro: normal mental status, normal strength, sensation, and motion Psych: alert; oriented to person, place and time, normally interactive and not anxious or depressed in appearance.   All labs reviewed with patient. Lipids: Lab Results  Component Value Date   CHOL 216 (H) 10/28/2018   Lab Results  Component Value Date   HDL 82.10 10/28/2018   Lab Results  Component Value Date   LDLCALC 119 (H) 10/28/2018   Lab Results  Component Value Date   TRIG 74.0 10/28/2018   Lab Results  Component Value Date   CHOLHDL 3 10/28/2018   CBC: CBC Latest Ref Rng & Units 10/28/2018 10/10/2017 10/03/2016  WBC 4.0 - 10.5 K/uL 5.0 5.2 6.1  Hemoglobin 12.0 - 15.0 g/dL 13.8 14.2 14.1  Hematocrit 36.0 - 46.0 % 41.4 42.3 42.1  Platelets 150.0 - 400.0 K/uL 225.0 213.0 528.4    Basic Metabolic Panel:    Component Value Date/Time   NA 141 10/28/2018 1129   K 4.3 10/28/2018 1129   CL 106 10/28/2018 1129   CO2 29 10/28/2018 1129   BUN 21 10/28/2018 1129   CREATININE 0.83 10/28/2018  1129   GLUCOSE 102 (H) 10/28/2018 1129   CALCIUM 9.0 10/28/2018 1129   Hepatic Function Latest Ref Rng & Units 10/28/2018 10/10/2017 10/03/2016  Total Protein 6.0 - 8.3 g/dL 6.4 6.5 6.6  Albumin 3.5 - 5.2 g/dL 4.2 4.3 4.3  AST 0 - 37 U/L '24 24 27  '$ ALT 0 - 35 U/L '18 17 19  '$ Alk Phosphatase 39 - 117 U/L 58 54 52  Total Bilirubin 0.2 - 1.2 mg/dL 0.7 0.7 0.7  Bilirubin, Direct 0.0 - 0.3 mg/dL 0.1 - -    No results found for: HGBA1C Lab Results  Component Value Date   TSH 1.55 10/28/2018    No results found.  Assessment and Plan:     ICD-10-CM   1. Healthcare maintenance  Z00.00     Globally doing well. Declined statin, reasonable.  I recommended a second opinion regarding cardiac questions  Patient Instructions  [facility]: [facility]: [place] Maintenance Exam: The patient's preventative maintenance and recommended screening tests for an annual wellness exam were reviewed in full today. Brought up to date unless services declined.  Counselled on the importance of diet, exercise, and its role in overall health and mortality. The patient's FH and SH was reviewed, including their home life, tobacco status, and drug and alcohol status.  Follow-up in 1 year for physical exam or additional follow-up below.  I have personally reviewed the Medicare Annual Wellness questionnaire and have noted 1. The patient's medical and social history 2. Their use of alcohol, tobacco or illicit drugs 3. Their current medications and supplements 4. The patient's functional ability including ADL's, fall risks, home safety risks and hearing or visual             impairment. 5. Diet and physical activities 6. Evidence for depression or mood disorders 7. Reviewed Updated provider list, see scanned forms and [facility] Snapshot.  8. Reviewed whether or not the patient has HCPOA or living will, and discussed what this means with the patient.  Recommended she bring in a copy for his chart in [facility].  The patients weight, height, BMI and visual acuity have been recorded in the chart I have made referrals, counseling and provided education to the patient based review of the above and I have provided the pt with a written personalized care plan for preventive services.  I have provided the patient with a copy of your personalized plan for preventive services. Instructed to take the time to review along with their updated medication list.  Follow-up: No follow-ups on file. Or follow-up in 1 year unless noted.  No orders of the defined types were placed in this encounter.  Medications Discontinued During This Encounter  Medication Reason  . ALPRAZolam (XANAX) 0.25 MG tablet No  longer needed (for PRN medications)  . Cholecalciferol (VITAMIN D) 2000 units tablet Change in therapy   No orders of the defined types were placed in this encounter.   Signed,  Maud Deed. Adri Schloss, MD   Allergies as of 11/02/2018      Reactions   Anise Flavor Other (See Comments)   Feels like she is going to pass out   Licorice Flavor [flavoring Agent] Palpitations      Medication List       Accurate as of November 02, 2018  9:41 AM. If you have any questions, ask your nurse or doctor.        STOP taking  these medications   ALPRAZolam 0.25 MG tablet Commonly known as: XANAX Stopped by: Owens Loffler, MD     TAKE these medications   atenolol 25 MG tablet Commonly known as: TENORMIN TAKE 1 TABLET BY MOUTH ONCE DAILY   Magnesium 250 MG Tabs Take 1 tablet by mouth daily.   PreserVision AREDS Caps Take 1 capsule by mouth daily.   VITAMIN A PO Take 5,000 Units by mouth daily.   vitamin B-12 1000 MCG tablet Commonly known as: CYANOCOBALAMIN Take 1,000 mcg by mouth daily.   Vitamin D (Cholecalciferol) 25 MCG (1000 UT) Tabs Take 1 tablet by mouth daily. What changed: Another medication with the same name was removed. Continue taking this medication, and follow the directions you see here. Changed by: Owens Loffler, MD

## 2018-11-02 ENCOUNTER — Ambulatory Visit (INDEPENDENT_AMBULATORY_CARE_PROVIDER_SITE_OTHER): Payer: Medicare Other | Admitting: Family Medicine

## 2018-11-02 ENCOUNTER — Other Ambulatory Visit: Payer: Self-pay

## 2018-11-02 ENCOUNTER — Encounter: Payer: Self-pay | Admitting: Family Medicine

## 2018-11-02 VITALS — BP 124/76 | HR 75 | Temp 98.1°F | Ht 69.5 in | Wt 128.5 lb

## 2018-11-02 DIAGNOSIS — Z0000 Encounter for general adult medical examination without abnormal findings: Secondary | ICD-10-CM | POA: Diagnosis not present

## 2018-11-02 NOTE — Patient Instructions (Signed)
[facility]: Tim Gollan [facility]: Jordan Hawks

## 2018-11-16 ENCOUNTER — Other Ambulatory Visit: Payer: Self-pay | Admitting: Family Medicine

## 2018-11-27 ENCOUNTER — Other Ambulatory Visit: Payer: Self-pay

## 2019-02-04 DIAGNOSIS — H401132 Primary open-angle glaucoma, bilateral, moderate stage: Secondary | ICD-10-CM | POA: Diagnosis not present

## 2019-02-11 DIAGNOSIS — H401132 Primary open-angle glaucoma, bilateral, moderate stage: Secondary | ICD-10-CM | POA: Diagnosis not present

## 2019-02-12 DIAGNOSIS — I38 Endocarditis, valve unspecified: Secondary | ICD-10-CM | POA: Diagnosis not present

## 2019-02-12 DIAGNOSIS — R000 Tachycardia, unspecified: Secondary | ICD-10-CM | POA: Diagnosis not present

## 2019-02-12 DIAGNOSIS — I358 Other nonrheumatic aortic valve disorders: Secondary | ICD-10-CM | POA: Diagnosis not present

## 2019-02-12 DIAGNOSIS — I10 Essential (primary) hypertension: Secondary | ICD-10-CM | POA: Diagnosis not present

## 2019-02-16 ENCOUNTER — Ambulatory Visit (INDEPENDENT_AMBULATORY_CARE_PROVIDER_SITE_OTHER): Payer: Medicare Other

## 2019-02-16 DIAGNOSIS — Z23 Encounter for immunization: Secondary | ICD-10-CM | POA: Diagnosis not present

## 2019-02-22 DIAGNOSIS — I38 Endocarditis, valve unspecified: Secondary | ICD-10-CM | POA: Diagnosis not present

## 2019-02-22 DIAGNOSIS — I10 Essential (primary) hypertension: Secondary | ICD-10-CM | POA: Diagnosis not present

## 2019-02-25 DIAGNOSIS — D225 Melanocytic nevi of trunk: Secondary | ICD-10-CM | POA: Diagnosis not present

## 2019-03-12 DIAGNOSIS — I10 Essential (primary) hypertension: Secondary | ICD-10-CM | POA: Diagnosis not present

## 2019-03-12 DIAGNOSIS — I38 Endocarditis, valve unspecified: Secondary | ICD-10-CM | POA: Diagnosis not present

## 2019-03-12 DIAGNOSIS — I358 Other nonrheumatic aortic valve disorders: Secondary | ICD-10-CM | POA: Diagnosis not present

## 2019-03-12 DIAGNOSIS — R000 Tachycardia, unspecified: Secondary | ICD-10-CM | POA: Diagnosis not present

## 2019-03-17 DIAGNOSIS — M1711 Unilateral primary osteoarthritis, right knee: Secondary | ICD-10-CM | POA: Diagnosis not present

## 2019-03-17 DIAGNOSIS — M25561 Pain in right knee: Secondary | ICD-10-CM | POA: Diagnosis not present

## 2019-03-24 DIAGNOSIS — M1711 Unilateral primary osteoarthritis, right knee: Secondary | ICD-10-CM | POA: Diagnosis not present

## 2019-03-24 DIAGNOSIS — M25561 Pain in right knee: Secondary | ICD-10-CM | POA: Diagnosis not present

## 2019-03-31 DIAGNOSIS — M1711 Unilateral primary osteoarthritis, right knee: Secondary | ICD-10-CM | POA: Diagnosis not present

## 2019-05-22 ENCOUNTER — Ambulatory Visit: Payer: Medicare Other | Attending: Internal Medicine

## 2019-05-22 DIAGNOSIS — Z23 Encounter for immunization: Secondary | ICD-10-CM | POA: Insufficient documentation

## 2019-05-22 NOTE — Progress Notes (Signed)
   Covid-19 Vaccination Clinic  Name:  Anne Ferrell    MRN: [id number] DOB: Sep 07, 1932  05/22/2019  [name] was observed post Covid-19 immunization for 15 minutes without incidence. She was provided with Vaccine Information Sheet and instruction to access the V-Safe system.   [name] was instructed to call 911 with any severe reactions post vaccine: Marland Kitchen Difficulty breathing  . Swelling of your face and throat  . A fast heartbeat  . A bad rash all over your body  . Dizziness and weakness    Immunizations Administered    Name Date Dose VIS Date Route   Pfizer COVID-19 Vaccine 05/22/2019 10:47 AM 0.3 mL 04/09/2019 Intramuscular   Manufacturer: [place]   Lot: [id number]   [place]: [id number]

## 2019-06-12 ENCOUNTER — Ambulatory Visit: Payer: Medicare Other | Attending: Internal Medicine

## 2019-06-12 DIAGNOSIS — Z23 Encounter for immunization: Secondary | ICD-10-CM | POA: Insufficient documentation

## 2019-06-12 NOTE — Progress Notes (Signed)
   Covid-19 Vaccination Clinic  Name:  Anne Ferrell    MRN: [id number] DOB: Sep 25, 1932  06/12/2019  Anne Ferrell was observed post Covid-19 immunization for 30 minutes based on pre-vaccination screening without incidence. She was provided with Vaccine Information Sheet and instruction to access the V-Safe system.   Anne Ferrell was instructed to call 911 with any severe reactions post vaccine: Marland Kitchen Difficulty breathing  . Swelling of your face and throat  . A fast heartbeat  . A bad rash all over your body  . Dizziness and weakness    Immunizations Administered    Name Date Dose VIS Date Route   Pfizer COVID-19 Vaccine 06/12/2019  1:20 PM 0.3 mL 04/09/2019 Intramuscular   Manufacturer: [place]   Lot: [id number]   [place]: [id number]

## 2019-07-14 DIAGNOSIS — R000 Tachycardia, unspecified: Secondary | ICD-10-CM | POA: Diagnosis not present

## 2019-07-14 DIAGNOSIS — I38 Endocarditis, valve unspecified: Secondary | ICD-10-CM | POA: Diagnosis not present

## 2019-07-14 DIAGNOSIS — I358 Other nonrheumatic aortic valve disorders: Secondary | ICD-10-CM | POA: Diagnosis not present

## 2019-07-14 DIAGNOSIS — I10 Essential (primary) hypertension: Secondary | ICD-10-CM | POA: Diagnosis not present

## 2019-09-14 DIAGNOSIS — I10 Essential (primary) hypertension: Secondary | ICD-10-CM | POA: Diagnosis not present

## 2019-09-14 DIAGNOSIS — I38 Endocarditis, valve unspecified: Secondary | ICD-10-CM | POA: Diagnosis not present

## 2019-09-14 DIAGNOSIS — R000 Tachycardia, unspecified: Secondary | ICD-10-CM | POA: Diagnosis not present

## 2019-09-14 DIAGNOSIS — I358 Other nonrheumatic aortic valve disorders: Secondary | ICD-10-CM | POA: Diagnosis not present

## 2019-10-19 ENCOUNTER — Other Ambulatory Visit: Payer: Self-pay | Admitting: Family Medicine

## 2019-10-29 DIAGNOSIS — H353132 Nonexudative age-related macular degeneration, bilateral, intermediate dry stage: Secondary | ICD-10-CM | POA: Diagnosis not present

## 2019-11-03 ENCOUNTER — Ambulatory Visit: Payer: Medicare Other

## 2019-11-03 ENCOUNTER — Other Ambulatory Visit: Payer: Self-pay | Admitting: Family Medicine

## 2019-11-03 ENCOUNTER — Ambulatory Visit (INDEPENDENT_AMBULATORY_CARE_PROVIDER_SITE_OTHER): Payer: Medicare Other

## 2019-11-03 VITALS — BP 117/80 | Wt 125.0 lb

## 2019-11-03 DIAGNOSIS — I471 Supraventricular tachycardia: Secondary | ICD-10-CM

## 2019-11-03 DIAGNOSIS — E785 Hyperlipidemia, unspecified: Secondary | ICD-10-CM

## 2019-11-03 DIAGNOSIS — Z79899 Other long term (current) drug therapy: Secondary | ICD-10-CM

## 2019-11-03 DIAGNOSIS — Z0000 Encounter for general adult medical examination without abnormal findings: Secondary | ICD-10-CM

## 2019-11-03 DIAGNOSIS — E559 Vitamin D deficiency, unspecified: Secondary | ICD-10-CM

## 2019-11-03 NOTE — Progress Notes (Signed)
PCP notes:  Health Maintenance: Dexa- declined   Abnormal Screenings: none   Patient concerns: none   Nurse concerns: none  Next PCP appt.: 11/10/2019 @ 9 am

## 2019-11-03 NOTE — Patient Instructions (Signed)
Ms. Anne Ferrell , Thank you for taking time to come for your Medicare Wellness Visit. I appreciate your ongoing commitment to your health goals. Please review the following plan we discussed and let me know if I can assist you in the future.   Screening recommendations/referrals: Colonoscopy: no longer required Mammogram: no longer required Bone Density: declined Recommended yearly ophthalmology/optometry visit for glaucoma screening and checkup Recommended yearly dental visit for hygiene and checkup  Vaccinations: Influenza vaccine: Up to date, completed 02/16/2019, due 11/2019 Pneumococcal vaccine: Completed series Tdap vaccine: Up to date, completed 06/14/2015, due 05/2025 Shingles vaccine: due, check with insurance for coverage   Covid-19:Completed series  Advanced directives: Please bring a copy of your POA (Power of Attorney) and/or Living Will to your next appointment.   Conditions/risks identified: hypertension  Next appointment: Follow up in one year for your annual wellness visit    Preventive Care 29 Years and Older, Female Preventive care refers to lifestyle choices and visits with your health care provider that can promote health and wellness. What does preventive care include?  A yearly physical exam. This is also called an annual well check.  Dental exams once or twice a year.  Routine eye exams. Ask your health care provider how often you should have your eyes checked.  Personal lifestyle choices, including:  Daily care of your teeth and gums.  Regular physical activity.  Eating a healthy diet.  Avoiding tobacco and drug use.  Limiting alcohol use.  Practicing safe sex.  Taking low-dose aspirin every day.  Taking vitamin and mineral supplements as recommended by your health care provider. What happens during an annual well check? The services and screenings done by your health care provider during your annual well check will depend on your age, overall  health, lifestyle risk factors, and family history of disease. Counseling  Your health care provider may ask you questions about your:  Alcohol use.  Tobacco use.  Drug use.  Emotional well-being.  Home and relationship well-being.  Sexual activity.  Eating habits.  History of falls.  Memory and ability to understand (cognition).  Work and work Statistician.  Reproductive health. Screening  You may have the following tests or measurements:  Height, weight, and BMI.  Blood pressure.  Lipid and cholesterol levels. These may be checked every 5 years, or more frequently if you are over 23 years old.  Skin check.  Lung cancer screening. You may have this screening every year starting at age 33 if you have a 30-pack-year history of smoking and currently smoke or have quit within the past 15 years.  Fecal occult blood test (FOBT) of the stool. You may have this test every year starting at age 55.  Flexible sigmoidoscopy or colonoscopy. You may have a sigmoidoscopy every 5 years or a colonoscopy every 10 years starting at age 84.  Hepatitis C blood test.  Hepatitis B blood test.  Sexually transmitted disease (STD) testing.  Diabetes screening. This is done by checking your blood sugar (glucose) after you have not eaten for a while (fasting). You may have this done every 1-3 years.  Bone density scan. This is done to screen for osteoporosis. You may have this done starting at age 55.  Mammogram. This may be done every 1-2 years. Talk to your health care provider about how often you should have regular mammograms. Talk with your health care provider about your test results, treatment options, and if necessary, the need for more tests. Vaccines  Your health  care provider may recommend certain vaccines, such as:  Influenza vaccine. This is recommended every year.  Tetanus, diphtheria, and acellular pertussis (Tdap, Td) vaccine. You may need a Td booster every 10  years.  Zoster vaccine. You may need this after age 64.  Pneumococcal 13-valent conjugate (PCV13) vaccine. One dose is recommended after age 46.  Pneumococcal polysaccharide (PPSV23) vaccine. One dose is recommended after age 76. Talk to your health care provider about which screenings and vaccines you need and how often you need them. This information is not intended to replace advice given to you by your health care provider. Make sure you discuss any questions you have with your health care provider. Document Released: 05/12/2015 Document Revised: 01/03/2016 Document Reviewed: 02/14/2015 Elsevier Interactive Patient Education  2017 [place] Prevention in the Home Falls can cause injuries. They can happen to people of all ages. There are many things you can do to make your home safe and to help prevent falls. What can I do on the outside of my home?  Regularly fix the edges of walkways and driveways and fix any cracks.  Remove anything that might make you trip as you walk through a door, such as a raised step or threshold.  Trim any bushes or trees on the path to your home.  Use bright outdoor lighting.  Clear any walking paths of anything that might make someone trip, such as rocks or tools.  Regularly check to see if handrails are loose or broken. Make sure that both sides of any steps have handrails.  Any raised decks and porches should have guardrails on the edges.  Have any leaves, snow, or ice cleared regularly.  Use sand or salt on walking paths during winter.  Clean up any spills in your garage right away. This includes oil or grease spills. What can I do in the bathroom?  Use night lights.  Install grab bars by the toilet and in the tub and shower. Do not use towel bars as grab bars.  Use non-skid mats or decals in the tub or shower.  If you need to sit down in the shower, use a plastic, non-slip stool.  Keep the floor dry. Clean up any water that  spills on the floor as soon as it happens.  Remove soap buildup in the tub or shower regularly.  Attach bath mats securely with double-sided non-slip rug tape.  Do not have throw rugs and other things on the floor that can make you trip. What can I do in the bedroom?  Use night lights.  Make sure that you have a light by your bed that is easy to reach.  Do not use any sheets or blankets that are too big for your bed. They should not hang down onto the floor.  Have a firm chair that has side arms. You can use this for support while you get dressed.  Do not have throw rugs and other things on the floor that can make you trip. What can I do in the kitchen?  Clean up any spills right away.  Avoid walking on wet floors.  Keep items that you use a lot in easy-to-reach places.  If you need to reach something above you, use a strong step stool that has a grab bar.  Keep electrical cords out of the way.  Do not use floor polish or wax that makes floors slippery. If you must use wax, use non-skid floor wax.  Do not have  throw rugs and other things on the floor that can make you trip. What can I do with my stairs?  Do not leave any items on the stairs.  Make sure that there are handrails on both sides of the stairs and use them. Fix handrails that are broken or loose. Make sure that handrails are as long as the stairways.  Check any carpeting to make sure that it is firmly attached to the stairs. Fix any carpet that is loose or worn.  Avoid having throw rugs at the top or bottom of the stairs. If you do have throw rugs, attach them to the floor with carpet tape.  Make sure that you have a light switch at the top of the stairs and the bottom of the stairs. If you do not have them, ask someone to add them for you. What else can I do to help prevent falls?  Wear shoes that:  Do not have high heels.  Have rubber bottoms.  Are comfortable and fit you well.  Are closed at the  toe. Do not wear sandals.  If you use a stepladder:  Make sure that it is fully opened. Do not climb a closed stepladder.  Make sure that both sides of the stepladder are locked into place.  Ask someone to hold it for you, if possible.  Clearly mark and make sure that you can see:  Any grab bars or handrails.  First and last steps.  Where the edge of each step is.  Use tools that help you move around (mobility aids) if they are needed. These include:  Canes.  Walkers.  Scooters.  Crutches.  Turn on the lights when you go into a dark area. Replace any light bulbs as soon as they burn out.  Set up your furniture so you have a clear path. Avoid moving your furniture around.  If any of your floors are uneven, fix them.  If there are any pets around you, be aware of where they are.  Review your medicines with your doctor. Some medicines can make you feel dizzy. This can increase your chance of falling. Ask your doctor what other things that you can do to help prevent falls. This information is not intended to replace advice given to you by your health care provider. Make sure you discuss any questions you have with your health care provider. Document Released: 02/09/2009 Document Revised: 09/21/2015 Document Reviewed: 05/20/2014 Elsevier Interactive Patient Education  2017 [employer].

## 2019-11-03 NOTE — Progress Notes (Signed)
Subjective:   Anne Ferrell is a 86 y.o. female who presents for Medicare Annual (Subsequent) preventive examination.  Review of Systems: N/A      I connected with the patient today by telephone and verified that I am speaking with the correct person using two identifiers. Location patient: home Location nurse: work Persons participating in the virtual visit: patient, Marine scientist.   I discussed the limitations, risks, security and privacy concerns of performing an evaluation and management service by telephone and the availability of in person appointments. I also discussed with the patient that there may be a patient responsible charge related to this service. The patient expressed understanding and verbally consented to this telephonic visit.    Interactive audio and video telecommunications were attempted between this nurse and patient, however failed, due to patient having technical difficulties OR patient did not have access to video capability.  We continued and completed visit with audio only.     Cardiac Risk Factors include: advanced age (>51men, >86 women);hypertension     Objective:    Today's Vitals   11/03/19 0855  BP: 117/80  Weight: 125 lb (56.7 kg)   Body mass index is 18.19 kg/m.  Advanced Directives 11/03/2019 10/10/2017 10/03/2016 09/12/2015 02/24/2015  Does Patient Have a Medical Advance Directive? Yes Yes Yes Yes No  Type of Paramedic of [place];Living will [place];Living will [place];Living will [place];Living will -  Does patient want to make changes to medical advance directive? - - - No - Patient declined -  Copy of [place] in Chart? No - copy requested No - copy requested No - copy requested No - copy requested -  Would patient like information on creating a medical advance directive? - - - - No - patient declined information    Current Medications  (verified) Outpatient Encounter Medications as of 11/03/2019  Medication Sig  . atenolol (TENORMIN) 25 MG tablet Take 1 tablet by mouth once daily  . Magnesium 250 MG TABS Take 1 tablet by mouth daily.  . Multiple Vitamins-Minerals (PRESERVISION AREDS) CAPS Take 1 capsule by mouth daily.  Marland Kitchen VITAMIN A PO Take 5,000 Units by mouth daily.  . vitamin B-12 (CYANOCOBALAMIN) 1000 MCG tablet Take 1,000 mcg by mouth daily.  . Vitamin D, Cholecalciferol, 25 MCG (1000 UT) TABS Take 1 tablet by mouth daily.   No facility-administered encounter medications on file as of 11/03/2019.    Allergies (verified) Anise flavor and Licorice flavor [flavoring agent]   History: Past Medical History:  Diagnosis Date  . Arthritis   . Diverticulitis   . Glaucoma   . Heart murmur   . High blood pressure   . Osteoporosis 10/05/2015  . Vitamin D deficiency 09/18/2015   Past Surgical History:  Procedure Laterality Date  . ABDOMINAL HYSTERECTOMY  2008  . cataract surgery Bilateral 2011  . TONSILLECTOMY AND ADENOIDECTOMY  1941   Family History  Problem Relation Age of Onset  . Cancer Father        prostate  . Colon cancer Neg Hx    Social History   Socioeconomic History  . Marital status: Widowed    Spouse name: Not on file  . Number of children: Not on file  . Years of education: Not on file  . Highest education level: Not on file  Occupational History  . Not on file  Tobacco Use  . Smoking status: Never Smoker  . Smokeless  tobacco: Never Used  Vaping Use  . Vaping Use: Never used  Substance and Sexual Activity  . Alcohol use: Yes    Alcohol/week: 7.0 standard drinks    Types: 7 Glasses of wine per week  . Drug use: No  . Sexual activity: Not Currently  Other Topics Concern  . Not on file  Social History Narrative   Mother of Koleen Nimrod   Social Determinants of Health   Financial Resource Strain: Low Risk   . Difficulty of Paying Living Expenses: Not hard at all  Food Insecurity: No  Food Insecurity  . Worried About Charity fundraiser in the Last Year: Never true  . Ran Out of Food in the Last Year: Never true  Transportation Needs: No Transportation Needs  . Lack of Transportation (Medical): No  . Lack of Transportation (Non-Medical): No  Physical Activity: Insufficiently Active  . Days of Exercise per Week: 7 days  . Minutes of Exercise per Session: 20 min  Stress: No Stress Concern Present  . Feeling of Stress : Not at all  Social Connections:   . Frequency of Communication with Friends and Family:   . Frequency of Social Gatherings with Friends and Family:   . Attends Religious Services:   . Active Member of Clubs or Organizations:   . Attends Archivist Meetings:   Marland Kitchen Marital Status:     Tobacco Counseling Counseling given: Not Answered   Clinical Intake:  Pre-visit preparation completed: Yes  Pain : No/denies pain     Nutritional Risks: None Diabetes: No CBG done?: No Did pt. bring in CBG monitor from home?: No  How often do you need to have someone help you when you read instructions, pamphlets, or other written materials from your doctor or pharmacy?: 1 - Never What is the last grade level you completed in school?: completed school in [place]  Diabetic: No  Nutrition Risk Assessment:  Has the patient had any N/V/D within the last 2 months?  No  Does the patient have any non-healing wounds?  No  Has the patient had any unintentional weight loss or weight gain?  No   Diabetes:  Is the patient diabetic?  No  If diabetic, was a CBG obtained today?  No  Did the patient bring in their glucometer from home?  No  How often do you monitor your CBG's? N/A.   Financial Strains and Diabetes Management:  Are you having any financial strains with the device, your supplies or your medication? No .  Does the patient want to be seen by Chronic Care Management for management of their diabetes?  No  Would the patient like to be referred to  a Nutritionist or for Diabetic Management?  No      Interpreter Needed?: No  Information entered by :: CJohnson, LPN   Activities of Daily Living In your present state of health, do you have any difficulty performing the following activities: 11/03/2019  Hearing? N  Vision? Y  Comment has glaucoma  Difficulty concentrating or making decisions? N  Walking or climbing stairs? N  Dressing or bathing? N  Doing errands, shopping? N  Preparing Food and eating ? N  Using the Toilet? N  In the past six months, have you accidently leaked urine? N  Do you have problems with loss of bowel control? N  Managing your Medications? N  Managing your Finances? N  Housekeeping or managing your Housekeeping? N  Some recent data might be hidden  Patient Care Team: Owens Loffler, MD as PCP - General (Family Medicine) Leandrew Koyanagi, MD as Referring Physician (Ophthalmology) Dionisio David, MD as Consulting Physician (Cardiology) Verda Cumins, MD as Consulting Physician (Rehabilitation)  Indicate any recent Medical Services you may have received from other than [facility] providers in the past year (date may be approximate).     Assessment:   This is a routine wellness examination for Kaelie.  Hearing/Vision screen  Hearing Screening   125Hz  250Hz  500Hz  1000Hz  2000Hz  3000Hz  4000Hz  6000Hz  8000Hz   Right ear:           Left ear:           Vision Screening Comments: Patient gets annual eye exams   Dietary issues and exercise activities discussed: Current Exercise Habits: Home exercise routine, Type of exercise: walking, Time (Minutes): 20, Frequency (Times/Week): 7, Weekly Exercise (Minutes/Week): 140, Intensity: Moderate, Exercise limited by: None identified  Goals    . Increase physical activity     Starting 10/03/16, I will continue to walk at least 30 min 3 days per week.     . Increase physical activity     Starting 10/10/2017, I will continue to walk at least 30 minutes 5 days  per week.     . Patient Stated     11/03/2019, I will continue to walk everyday for 20 minutes.       Depression Screen PHQ 2/9 Scores 11/03/2019 11/02/2018 10/10/2017 10/03/2016 09/12/2015 07/06/2014 04/12/2013  PHQ - 2 Score 0 0 0 0 0 0 0  PHQ- 9 Score 0 - 0 - - - -    Fall Risk Fall Risk  11/03/2019 11/27/2018 11/02/2018 10/10/2017 11/25/2016  Falls in the past year? 0 0 0 No No  Comment - Emmi Telephone Survey: data to providers prior to load - - Emmi Telephone Survey: data to providers prior to load  Number falls in past yr: 0 - - - -  Injury with Fall? 0 - - - -  Risk for fall due to : Medication side effect - - - -  Follow up Falls evaluation completed;Falls prevention discussed - - - -    Any stairs in or around the home? Yes  If so, are there any without handrails? No  Home free of loose throw rugs in walkways, pet beds, electrical cords, etc? Yes  Adequate lighting in your home to reduce risk of falls? Yes   ASSISTIVE DEVICES UTILIZED TO PREVENT FALLS:  Life alert? No  Use of a cane, walker or w/c? No  Grab bars in the bathroom? No  Shower chair or bench in shower? No  Elevated toilet seat or a handicapped toilet? No   TIMED UP AND GO:  Was the test performed? N/A, telephonic visit.    Cognitive Function: MMSE - Mini Mental State Exam 11/03/2019 10/10/2017 10/03/2016 09/12/2015  Orientation to time 5 5 5 5   Orientation to Place 5 5 5 5   Registration 3 3 3 3   Attention/ Calculation 5 0 0 0  Recall 3 3 3 3   Language- name 2 objects - 0 0 0  Language- repeat 1 1 1 1   Language- follow 3 step command - 3 3 3   Language- read & follow direction - 0 0 0  Write a sentence - 0 0 0  Copy design - 0 0 0  Total score - 20 20 20   Mini Cog  Mini-Cog screen was completed. Maximum score is 22. A value of 0 denotes this  part of the MMSE was not completed or the patient failed this part of the Mini-Cog screening.       Immunizations Immunization History  Administered Date(s) Administered   . Fluad Quad(high Dose 65+) 02/16/2019  . Influenza,inj,Quad PF,6+ Mos 04/12/2013, 03/10/2014, 03/29/2015, 03/05/2016, 03/06/2017, 02/26/2018  . PFIZER SARS-COV-2 Vaccination 05/22/2019, 06/12/2019  . Pneumococcal Conjugate-13 09/12/2015  . Pneumococcal Polysaccharide-23 04/12/2013    TDAP status: Up to date Flu Vaccine status: Up to date Pneumococcal vaccine status: Up to date Covid-19 vaccine status: Completed vaccines  Qualifies for Shingles Vaccine? Yes   Zostavax completed No   Shingrix Completed?: No.    Education has been provided regarding the importance of this vaccine. Patient has been advised to call insurance company to determine out of pocket expense if they have not yet received this vaccine. Advised may also receive vaccine at local pharmacy or Health Dept. Verbalized acceptance and understanding.  Screening Tests Health Maintenance  Topic Date Due  . INFLUENZA VACCINE  11/28/2019  . TETANUS/TDAP  06/13/2025  . DEXA SCAN  Completed  . COVID-19 Vaccine  Completed  . PNA vac Low Risk Adult  Completed  . MAMMOGRAM  Discontinued    Health Maintenance  There are no preventive care reminders to display for this patient.  Colorectal cancer screening: No longer required.  Mammogram status: No longer required.  Bone Density status: Patient declined  Lung Cancer Screening: (Low Dose CT Chest recommended if Age 44-80 years, 30 pack-year currently smoking OR have quit w/in 15years.) does not qualify.    Additional Screening:  Hepatitis C Screening: does not qualify; Completed N/A  Vision Screening: Recommended annual ophthalmology exams for early detection of glaucoma and other disorders of the eye. Is the patient up to date with their annual eye exam?  Yes  Who is the provider or what is the name of the office in which the patient attends annual eye exams? [facility] If pt is not established with a provider, would they like to be referred to a provider to  establish care? No .   Dental Screening: Recommended annual dental exams for proper oral hygiene  Community Resource Referral / Chronic Care Management: CRR required this visit?  No   CCM required this visit?  No      Plan:     I have personally reviewed and noted the following in the patient's chart:   . Medical and social history . Use of alcohol, tobacco or illicit drugs  . Current medications and supplements . Functional ability and status . Nutritional status . Physical activity . Advanced directives . List of other physicians . Hospitalizations, surgeries, and ER visits in previous 12 months . Vitals . Screenings to include cognitive, depression, and falls . Referrals and appointments  In addition, I have reviewed and discussed with patient certain preventive protocols, quality metrics, and best practice recommendations. A written personalized care plan for preventive services as well as general preventive health recommendations were provided to patient.   Due to this being a telephonic visit, the after visit summary with patients personalized plan was offered to patient via mail or my-chart.  Patient preferred to pick up at office at next visit.   Andrez Grime, LPN   [id number]

## 2019-11-03 NOTE — Progress Notes (Signed)
I reviewed health advisor's note, was available for consultation, and agree with documentation and plan.   Signed,  Gola Bribiesca T. Trenden Hazelrigg, MD  

## 2019-11-04 ENCOUNTER — Other Ambulatory Visit (INDEPENDENT_AMBULATORY_CARE_PROVIDER_SITE_OTHER): Payer: Medicare Other

## 2019-11-04 ENCOUNTER — Other Ambulatory Visit: Payer: Self-pay

## 2019-11-04 DIAGNOSIS — E785 Hyperlipidemia, unspecified: Secondary | ICD-10-CM

## 2019-11-04 DIAGNOSIS — E559 Vitamin D deficiency, unspecified: Secondary | ICD-10-CM

## 2019-11-04 DIAGNOSIS — I471 Supraventricular tachycardia, unspecified: Secondary | ICD-10-CM

## 2019-11-04 DIAGNOSIS — Z79899 Other long term (current) drug therapy: Secondary | ICD-10-CM

## 2019-11-04 LAB — BASIC METABOLIC PANEL
BUN: 22 mg/dL (ref 6–23)
CO2: 28 mEq/L (ref 19–32)
Calcium: 9.4 mg/dL (ref 8.4–10.5)
Chloride: 104 mEq/L (ref 96–112)
Creatinine, Ser: 0.78 mg/dL (ref 0.40–1.20)
GFR: 70.14 mL/min (ref >60.00)
Glucose, Bld: 99 mg/dL (ref 70–99)
Potassium: 4.2 mEq/L (ref 3.5–5.1)
Sodium: 140 mEq/L (ref 135–145)

## 2019-11-04 LAB — HEPATIC FUNCTION PANEL
ALT: 20 U/L (ref 0–35)
AST: 28 U/L (ref 0–37)
Albumin: 4.5 g/dL (ref 3.5–5.2)
Alkaline Phosphatase: 65 U/L (ref 39–117)
Bilirubin, Direct: 0.2 mg/dL (ref 0.0–0.3)
Total Bilirubin: 0.8 mg/dL (ref 0.2–1.2)
Total Protein: 6.2 g/dL (ref 6.0–8.3)

## 2019-11-04 LAB — CBC WITH DIFFERENTIAL/PLATELET
Basophils Absolute: 0.1 10*3/uL (ref 0.0–0.1)
Basophils Relative: 1.5 % (ref 0.0–3.0)
Eosinophils Absolute: 0 10*3/uL (ref 0.0–0.7)
Eosinophils Relative: 0.8 % (ref 0.0–5.0)
HCT: 42 % (ref 36.0–46.0)
Hemoglobin: 14.2 g/dL (ref 12.0–15.0)
Lymphocytes Relative: 16.7 % (ref 12.0–46.0)
Lymphs Abs: 1 10*3/uL (ref 0.7–4.0)
MCHC: 33.8 g/dL (ref 30.0–36.0)
MCV: 94.8 fl (ref 78.0–100.0)
Monocytes Absolute: 0.6 10*3/uL (ref 0.1–1.0)
Monocytes Relative: 10 % (ref 3.0–12.0)
Neutro Abs: 4 10*3/uL (ref 1.4–7.7)
Neutrophils Relative %: 71 % (ref 43.0–77.0)
Platelets: 231 10*3/uL (ref 150.0–400.0)
RBC: 4.43 Mil/uL (ref 3.87–5.11)
RDW: 13.4 % (ref 11.5–15.5)
WBC: 5.7 10*3/uL (ref 4.0–10.5)

## 2019-11-04 LAB — LIPID PANEL
Cholesterol: 227 mg/dL — ABNORMAL HIGH (ref 0–200)
HDL: 85.1 mg/dL (ref >39.00)
LDL Cholesterol: 129 mg/dL — ABNORMAL HIGH (ref 0–99)
NonHDL: 141.89
Total CHOL/HDL Ratio: 3
Triglycerides: 66 mg/dL (ref 0.0–149.0)
VLDL: 13.2 mg/dL (ref 0.0–40.0)

## 2019-11-04 LAB — TSH: TSH: 1.16 u[IU]/mL (ref 0.35–4.50)

## 2019-11-04 LAB — VITAMIN D 25 HYDROXY (VIT D DEFICIENCY, FRACTURES): VITD: 34.9 ng/mL (ref 30.00–100.00)

## 2019-11-09 NOTE — Progress Notes (Signed)
Anne Molner T. Judah Carchi, MD, [place] at [facility] [place], [address]  Phone: [phone]  FAX: [phone]  Anne Ferrell - 86 y.o. female  MRN [id number]  Date of Birth: [birth year]  Date: 11/10/2019  PCP: Owens Loffler, MD  Referral: Owens Loffler, MD  Chief Complaint  Ferrell presents with  . Annual Exam    Part 2    This visit occurred during the SARS-CoV-2 public health emergency.  Safety protocols were in place, including screening questions prior to the visit, additional usage of staff PPE, and extensive cleaning of exam room while observing appropriate contact time as indicated for disinfecting solutions.   Subjective:   Anne Ferrell who presents with the following:  She is basically a very healthy 86 year old, young for age and she has functionally minimal medical problems.  She does have some valvular heart disease and some tachycardia.  She has taken some atenolol once daily for many years for control of her tachycardia.  Aside from this she takes some basic vitamins such as vitamin D, B12, as well as vitamin A and a multivitamin.  Had one dental implant.  Had some cataract surgery about ten years ago.  Lens is loosening.   Overall, she is well.   Health Maintenance  Topic Date Due  . INFLUENZA VACCINE  11/28/2019  . TETANUS/TDAP  06/13/2025  . DEXA SCAN  Completed  . COVID-19 Vaccine  Completed  . PNA vac Low Risk Adult  Completed     Review of Systems is noted in the HPI, as appropriate  Objective:   BP (!) 150/80   Pulse 78   Temp 97.6 F (36.4 C) (Temporal)   Ht 5' 9.5" (1.765 m)   Wt 126 lb (57.2 kg)   SpO2 98%   BMI 18.34 kg/m   GEN: WDWN, NAD, Non-toxic HEENT: Atraumatic, Normocephalic. Neck supple. No masses. CV: RRR, No M/G/R. No JVD. No thrill. No extra heart sounds. PULM: CTA B,  no wheezes, crackles, rhonchi. No retractions. No resp. distress. No accessory muscle use. EXTR: No c/c/e NEURO Normal gait.  PSYCH: Normally interactive. Conversant.   Laboratory and Imaging Data: Results for orders placed or performed in visit on 11/04/19  VITAMIN D 25 Hydroxy (Vit-D Deficiency, Fractures)  Result Value Ref Range   VITD 34.90 30.00 - 100.00 ng/mL  TSH  Result Value Ref Range   TSH 1.16 0.35 - 4.50 uIU/mL  CBC with Differential/Platelet  Result Value Ref Range   WBC 5.7 4.0 - 10.5 K/uL   RBC 4.43 3.87 - 5.11 Mil/uL   Hemoglobin 14.2 12.0 - 15.0 g/dL   HCT 42.0 36 - 46 %   MCV 94.8 78.0 - 100.0 fl   MCHC 33.8 30.0 - 36.0 g/dL   RDW 13.4 11.5 - 15.5 %   Platelets 231.0 150 - 400 K/uL   Neutrophils Relative % 71.0 43 - 77 %   Lymphocytes Relative 16.7 12 - 46 %   Monocytes Relative 10.0 3 - 12 %   Eosinophils Relative 0.8 0 - 5 %   Basophils Relative 1.5 0 - 3 %   Neutro Abs 4.0 1.4 - 7.7 K/uL   Lymphs Abs 1.0 0.7 - 4.0 K/uL   Monocytes Absolute 0.6 0 - 1 K/uL   Eosinophils Absolute 0.0 0 - 0 K/uL   Basophils Absolute 0.1 0 - 0 K/uL  Basic metabolic panel  Result Value Ref Range   Sodium 140 135 - 145 mEq/L   Potassium 4.2 3.5 - 5.1 mEq/L   Chloride 104 96 - 112 mEq/L   CO2 28 19 - 32 mEq/L   Glucose, Bld 99 70 - 99 mg/dL   BUN 22 6 - 23 mg/dL   Creatinine, Ser 0.78 0.40 - 1.20 mg/dL   GFR 70.14 >60.00 mL/min   Calcium 9.4 8.4 - 10.5 mg/dL  Hepatic function panel  Result Value Ref Range   Total Bilirubin 0.8 0.2 - 1.2 mg/dL   Bilirubin, Direct 0.2 0.0 - 0.3 mg/dL   Alkaline Phosphatase 65 39 - 117 U/L   AST 28 0 - 37 U/L   ALT 20 0 - 35 U/L   Total Protein 6.2 6.0 - 8.3 g/dL   Albumin 4.5 3.5 - 5.2 g/dL  Lipid panel  Result Value Ref Range   Cholesterol 227 (H) 0 - 200 mg/dL   Triglycerides 66.0 0 - 149 mg/dL   HDL 85.10 >39.00 mg/dL   VLDL 13.2 0.0 - 40.0 mg/dL   LDL Cholesterol 129 (H) 0 - 99 mg/dL   Total CHOL/HDL Ratio 3    NonHDL 141.89        Assessment and Plan:     ICD-10-CM   1. Tachycardia  R00.0   2. Valvular heart disease  I38   3. Vitamin D deficiency  E55.9    Globally doing well  Increase vit D to 2,000 units   A day  HDL > 80 Overall, no real other issues that need to be addressed  Follow-up: 1 year  Signed,  Frederico Hamman T. Sharel Behne, MD   Outpatient Encounter Medications as of 11/10/2019  Medication Sig  . atenolol (TENORMIN) 25 MG tablet Take 1 tablet by mouth once daily  . Magnesium 250 MG TABS Take 1 tablet by mouth daily.  . Multiple Vitamins-Minerals (PRESERVISION AREDS) CAPS Take 1 capsule by mouth daily.  Marland Kitchen VITAMIN A PO Take 5,000 Units by mouth daily.  . vitamin B-12 (CYANOCOBALAMIN) 1000 MCG tablet Take 1,000 mcg by mouth daily.  . Vitamin D, Cholecalciferol, 25 MCG (1000 UT) TABS Take 1 tablet by mouth daily.   No facility-administered encounter medications on file as of 11/10/2019.

## 2019-11-10 ENCOUNTER — Other Ambulatory Visit: Payer: Self-pay

## 2019-11-10 ENCOUNTER — Encounter: Payer: Self-pay | Admitting: Family Medicine

## 2019-11-10 ENCOUNTER — Ambulatory Visit (INDEPENDENT_AMBULATORY_CARE_PROVIDER_SITE_OTHER): Payer: Medicare Other | Admitting: Family Medicine

## 2019-11-10 VITALS — BP 150/80 | HR 78 | Temp 97.6°F | Ht 69.5 in | Wt 126.0 lb

## 2019-11-10 DIAGNOSIS — E559 Vitamin D deficiency, unspecified: Secondary | ICD-10-CM

## 2019-11-10 DIAGNOSIS — R000 Tachycardia, unspecified: Secondary | ICD-10-CM | POA: Diagnosis not present

## 2019-11-10 DIAGNOSIS — I38 Endocarditis, valve unspecified: Secondary | ICD-10-CM

## 2019-11-11 DIAGNOSIS — H26491 Other secondary cataract, right eye: Secondary | ICD-10-CM | POA: Diagnosis not present

## 2019-11-22 DIAGNOSIS — H26492 Other secondary cataract, left eye: Secondary | ICD-10-CM | POA: Diagnosis not present

## 2019-12-02 DIAGNOSIS — M542 Cervicalgia: Secondary | ICD-10-CM | POA: Diagnosis not present

## 2020-01-12 ENCOUNTER — Other Ambulatory Visit: Payer: Self-pay | Admitting: Family Medicine

## 2020-03-01 DIAGNOSIS — D485 Neoplasm of uncertain behavior of skin: Secondary | ICD-10-CM | POA: Diagnosis not present

## 2020-03-01 DIAGNOSIS — D225 Melanocytic nevi of trunk: Secondary | ICD-10-CM | POA: Diagnosis not present

## 2020-03-01 DIAGNOSIS — L570 Actinic keratosis: Secondary | ICD-10-CM | POA: Diagnosis not present

## 2020-03-01 DIAGNOSIS — D045 Carcinoma in situ of skin of trunk: Secondary | ICD-10-CM | POA: Diagnosis not present

## 2020-03-01 DIAGNOSIS — S90862A Insect bite (nonvenomous), left foot, initial encounter: Secondary | ICD-10-CM | POA: Diagnosis not present

## 2020-03-09 DIAGNOSIS — D045 Carcinoma in situ of skin of trunk: Secondary | ICD-10-CM | POA: Diagnosis not present

## 2020-03-13 DIAGNOSIS — I358 Other nonrheumatic aortic valve disorders: Secondary | ICD-10-CM | POA: Diagnosis not present

## 2020-03-13 DIAGNOSIS — I38 Endocarditis, valve unspecified: Secondary | ICD-10-CM | POA: Diagnosis not present

## 2020-03-13 DIAGNOSIS — I10 Essential (primary) hypertension: Secondary | ICD-10-CM | POA: Diagnosis not present

## 2020-03-20 DIAGNOSIS — Z23 Encounter for immunization: Secondary | ICD-10-CM | POA: Diagnosis not present

## 2020-04-19 DIAGNOSIS — M1711 Unilateral primary osteoarthritis, right knee: Secondary | ICD-10-CM | POA: Diagnosis not present

## 2020-05-03 DIAGNOSIS — M1711 Unilateral primary osteoarthritis, right knee: Secondary | ICD-10-CM | POA: Diagnosis not present

## 2020-05-05 DIAGNOSIS — H401132 Primary open-angle glaucoma, bilateral, moderate stage: Secondary | ICD-10-CM | POA: Diagnosis not present

## 2020-05-06 DIAGNOSIS — Z23 Encounter for immunization: Secondary | ICD-10-CM | POA: Diagnosis not present

## 2020-05-09 DIAGNOSIS — H401112 Primary open-angle glaucoma, right eye, moderate stage: Secondary | ICD-10-CM | POA: Diagnosis not present

## 2020-05-10 DIAGNOSIS — M1711 Unilateral primary osteoarthritis, right knee: Secondary | ICD-10-CM | POA: Diagnosis not present

## 2020-06-12 DIAGNOSIS — H401112 Primary open-angle glaucoma, right eye, moderate stage: Secondary | ICD-10-CM | POA: Diagnosis not present

## 2020-06-17 DIAGNOSIS — W57XXXA Bitten or stung by nonvenomous insect and other nonvenomous arthropods, initial encounter: Secondary | ICD-10-CM | POA: Diagnosis not present

## 2020-06-17 DIAGNOSIS — S40861A Insect bite (nonvenomous) of right upper arm, initial encounter: Secondary | ICD-10-CM | POA: Diagnosis not present

## 2020-07-14 DIAGNOSIS — H401112 Primary open-angle glaucoma, right eye, moderate stage: Secondary | ICD-10-CM | POA: Diagnosis not present

## 2020-08-22 DIAGNOSIS — I38 Endocarditis, valve unspecified: Secondary | ICD-10-CM | POA: Diagnosis not present

## 2020-08-22 DIAGNOSIS — I10 Essential (primary) hypertension: Secondary | ICD-10-CM | POA: Diagnosis not present

## 2020-08-22 DIAGNOSIS — R002 Palpitations: Secondary | ICD-10-CM | POA: Diagnosis not present

## 2020-08-22 DIAGNOSIS — I358 Other nonrheumatic aortic valve disorders: Secondary | ICD-10-CM | POA: Diagnosis not present

## 2020-08-22 DIAGNOSIS — R000 Tachycardia, unspecified: Secondary | ICD-10-CM | POA: Diagnosis not present

## 2020-09-01 DIAGNOSIS — I358 Other nonrheumatic aortic valve disorders: Secondary | ICD-10-CM | POA: Diagnosis not present

## 2020-09-19 DIAGNOSIS — I38 Endocarditis, valve unspecified: Secondary | ICD-10-CM | POA: Diagnosis not present

## 2020-09-19 DIAGNOSIS — R000 Tachycardia, unspecified: Secondary | ICD-10-CM | POA: Diagnosis not present

## 2020-09-19 DIAGNOSIS — F4322 Adjustment disorder with anxiety: Secondary | ICD-10-CM | POA: Diagnosis not present

## 2020-09-19 DIAGNOSIS — I10 Essential (primary) hypertension: Secondary | ICD-10-CM | POA: Diagnosis not present

## 2020-09-19 DIAGNOSIS — I358 Other nonrheumatic aortic valve disorders: Secondary | ICD-10-CM | POA: Diagnosis not present

## 2020-10-06 DIAGNOSIS — L814 Other melanin hyperpigmentation: Secondary | ICD-10-CM | POA: Diagnosis not present

## 2020-10-06 DIAGNOSIS — L853 Xerosis cutis: Secondary | ICD-10-CM | POA: Diagnosis not present

## 2020-10-06 DIAGNOSIS — Z85828 Personal history of other malignant neoplasm of skin: Secondary | ICD-10-CM | POA: Diagnosis not present

## 2020-10-06 DIAGNOSIS — D225 Melanocytic nevi of trunk: Secondary | ICD-10-CM | POA: Diagnosis not present

## 2020-10-06 DIAGNOSIS — D485 Neoplasm of uncertain behavior of skin: Secondary | ICD-10-CM | POA: Diagnosis not present

## 2020-10-06 DIAGNOSIS — Z08 Encounter for follow-up examination after completed treatment for malignant neoplasm: Secondary | ICD-10-CM | POA: Diagnosis not present

## 2020-11-03 ENCOUNTER — Ambulatory Visit (INDEPENDENT_AMBULATORY_CARE_PROVIDER_SITE_OTHER): Payer: Medicare Other

## 2020-11-03 DIAGNOSIS — Z0000 Encounter for general adult medical examination without abnormal findings: Secondary | ICD-10-CM | POA: Diagnosis not present

## 2020-11-03 NOTE — Progress Notes (Signed)
PCP notes:  Health Maintenance: Shingrix- due   Abnormal Screenings: none   Patient concerns: none   Nurse concerns: none   Next PCP appt.: 11/13/2020 @ 9 am

## 2020-11-03 NOTE — Progress Notes (Signed)
Subjective:   Anne Ferrell is a 87 y.o. female who presents for Medicare Annual (Subsequent) preventive examination.  Review of Systems: N/A      I connected with the patient today by telephone and verified that I am speaking with the correct person using two identifiers. Location patient: home Location nurse: work Persons participating in the telephone visit: patient, nurse.   I discussed the limitations, risks, security and privacy concerns of performing an evaluation and management service by telephone and the availability of in person appointments. I also discussed with the patient that there may be a patient responsible charge related to this service. The patient expressed understanding and verbally consented to this telephonic visit.        Cardiac Risk Factors include: advanced age (>77men, >40 women);hypertension     Objective:    Today's Vitals   There is no height or weight on file to calculate BMI.  Advanced Directives 11/03/2020 11/03/2019 10/10/2017 10/03/2016 09/12/2015 02/24/2015  Does Patient Have a Medical Advance Directive? Yes Yes Yes Yes Yes No  Type of Paramedic of [place];Living will [place];Living will [place];Living will [place];Living will [place];Living will -  Does patient want to make changes to medical advance directive? - - - - No - Patient declined -  Copy of [place] in Chart? No - copy requested No - copy requested No - copy requested No - copy requested No - copy requested -  Would patient like information on creating a medical advance directive? - - - - - No - patient declined information    Current Medications (verified) Outpatient Encounter Medications as of 11/03/2020  Medication Sig   atenolol (TENORMIN) 25 MG tablet Take 1 tablet by mouth once daily   Magnesium 250 MG TABS Take 1 tablet by mouth daily.   Multiple  Vitamins-Minerals (PRESERVISION AREDS) CAPS Take 1 capsule by mouth daily.   VITAMIN A PO Take 5,000 Units by mouth daily.   vitamin B-12 (CYANOCOBALAMIN) 1000 MCG tablet Take 1,000 mcg by mouth daily.   Vitamin D, Cholecalciferol, 25 MCG (1000 UT) TABS Take 1 tablet by mouth daily.   No facility-administered encounter medications on file as of 11/03/2020.    Allergies (verified) Anise flavor and Licorice flavor [flavoring agent]   History: Past Medical History:  Diagnosis Date   Arthritis    Diverticulitis    Glaucoma    Heart murmur    High blood pressure    Osteoporosis 10/05/2015   Vitamin D deficiency 09/18/2015   Past Surgical History:  Procedure Laterality Date   ABDOMINAL HYSTERECTOMY  2008   cataract surgery Bilateral 2011   TONSILLECTOMY AND ADENOIDECTOMY  1941   Family History  Problem Relation Age of Onset   Cancer Father        prostate   Colon cancer Neg Hx    Social History   Socioeconomic History   Marital status: Widowed    Spouse name: Not on file   Number of children: Not on file   Years of education: Not on file   Highest education level: Not on file  Occupational History   Not on file  Tobacco Use   Smoking status: Never   Smokeless tobacco: Never  Vaping Use   Vaping Use: Never used  Substance and Sexual Activity   Alcohol use: Yes    Alcohol/week: 7.0 standard drinks    Types: 7 Glasses  of wine per week   Drug use: No   Sexual activity: Not Currently  Other Topics Concern   Not on file  Social History Narrative   Mother of Koleen Nimrod   Social Determinants of Health   Financial Resource Strain: Low Risk    Difficulty of Paying Living Expenses: Not hard at all  Food Insecurity: No Food Insecurity   Worried About Charity fundraiser in the Last Year: Never true   Arboriculturist in the Last Year: Never true  Transportation Needs: No Transportation Needs   Lack of Transportation (Medical): No   Lack of Transportation (Non-Medical):  No  Physical Activity: Insufficiently Active   Days of Exercise per Week: 7 days   Minutes of Exercise per Session: 20 min  Stress: No Stress Concern Present   Feeling of Stress : Not at all  Social Connections: Not on file    Tobacco Counseling Counseling given: Not Answered   Clinical Intake:  Pre-visit preparation completed: Yes  Pain : No/denies pain     Nutritional Risks: None Diabetes: No  How often do you need to have someone help you when you read instructions, pamphlets, or other written materials from your doctor or pharmacy?: 1 - Never  Diabetic: No Nutrition Risk Assessment:  Has the patient had any N/V/D within the last 2 months?  No  Does the patient have any non-healing wounds?  No  Has the patient had any unintentional weight loss or weight gain?  No   Diabetes:  Is the patient diabetic?  No  If diabetic, was a CBG obtained today?   N/A Did the patient bring in their glucometer from home?   N/A How often do you monitor your CBG's? N/A.   Financial Strains and Diabetes Management:  Are you having any financial strains with the device, your supplies or your medication?  N/A .  Does the patient want to be seen by Chronic Care Management for management of their diabetes?   N/A Would the patient like to be referred to a Nutritionist or for Diabetic Management?   N/A  Interpreter Needed?: No  Information entered by :: CJohnson, LPN   Activities of Daily Living In your present state of health, do you have any difficulty performing the following activities: 11/03/2020  Hearing? Y  Comment some hearing loss noted  Vision? N  Difficulty concentrating or making decisions? N  Walking or climbing stairs? N  Dressing or bathing? N  Doing errands, shopping? N  Preparing Food and eating ? N  Using the Toilet? N  In the past six months, have you accidently leaked urine? Y  Comment occasional at night  Do you have problems with loss of bowel control? N   Managing your Medications? N  Managing your Finances? N  Housekeeping or managing your Housekeeping? N  Some recent data might be hidden    Patient Care Team: Owens Loffler, MD as PCP - General (Family Medicine) Leandrew Koyanagi, MD as Referring Physician (Ophthalmology) Dionisio David, MD as Consulting Physician (Cardiology) Verda Cumins, MD as Consulting Physician (Rehabilitation)  Indicate any recent Medical Services you may have received from other than [facility] providers in the past year (date may be approximate).     Assessment:   This is a routine wellness examination for Naomia.  Hearing/Vision screen Vision Screening - Comments:: Patient gets annual eye exams   Dietary issues and exercise activities discussed: Current Exercise Habits: Home exercise routine, Type of exercise:  walking, Time (Minutes): 15, Frequency (Times/Week): 7, Weekly Exercise (Minutes/Week): 105, Intensity: Moderate, Exercise limited by: None identified   Goals Addressed             This Visit's Progress    Patient Stated       11/03/2020, I will continue to walk daily for 15 minutes.         Depression Screen PHQ 2/9 Scores 11/03/2020 11/03/2019 11/02/2018 10/10/2017 10/03/2016 09/12/2015 07/06/2014  PHQ - 2 Score 0 0 0 0 0 0 0  PHQ- 9 Score 0 0 - 0 - - -    Fall Risk Fall Risk  11/03/2020 11/03/2019 11/27/2018 11/02/2018 10/10/2017  Falls in the past year? 0 0 0 0 No  Comment - - Emmi Telephone Survey: data to providers prior to load - -  Number falls in past yr: 0 0 - - -  Injury with Fall? 0 0 - - -  Risk for fall due to : Medication side effect Medication side effect - - -  Follow up Falls evaluation completed;Falls prevention discussed Falls evaluation completed;Falls prevention discussed - - -    FALL RISK PREVENTION PERTAINING TO THE HOME:  Any stairs in or around the home? Yes  If so, are there any without handrails? No  Home free of loose throw rugs in walkways, pet beds, electrical  cords, etc? Yes  Adequate lighting in your home to reduce risk of falls? Yes   ASSISTIVE DEVICES UTILIZED TO PREVENT FALLS:  Life alert? No  Use of a cane, walker or w/c? No  Grab bars in the bathroom? No  Shower chair or bench in shower? No  Elevated toilet seat or a handicapped toilet? No   TIMED UP AND GO:  Was the test performed?  N/A telephone visit .    Cognitive Function: MMSE - Mini Mental State Exam 11/03/2020 11/03/2019 10/10/2017 10/03/2016 09/12/2015  Orientation to time 5 5 5 5 5   Orientation to Place 5 5 5 5 5   Registration 3 3 3 3 3   Attention/ Calculation 5 5 0 0 0  Recall 3 3 3 3 3   Language- name 2 objects - - 0 0 0  Language- repeat 1 1 1 1 1   Language- follow 3 step command - - 3 3 3   Language- read & follow direction - - 0 0 0  Write a sentence - - 0 0 0  Copy design - - 0 0 0  Total score - - 20 20 20   Mini Cog  Mini-Cog screen was completed. Maximum score is 22. A value of 0 denotes this part of the MMSE was not completed or the patient failed this part of the Mini-Cog screening.       Immunizations Immunization History  Administered Date(s) Administered   Fluad Quad(high Dose 65+) 02/16/2019   Influenza,inj,Quad PF,6+ Mos 04/12/2013, 03/10/2014, 03/29/2015, 03/05/2016, 03/06/2017, 02/26/2018   Moderna SARS-COV2 Booster Vaccination 04/30/2020   PFIZER(Purple Top)SARS-COV-2 Vaccination 05/22/2019, 06/12/2019   Pneumococcal Conjugate-13 09/12/2015   Pneumococcal Polysaccharide-23 04/12/2013    TDAP status: Up to date  Flu Vaccine status: Up to date  Pneumococcal vaccine status: Up to date  Covid-19 vaccine status: Completed 3 vaccines  Qualifies for Shingles Vaccine? Yes   Zostavax completed No   Shingrix Completed?: No.    Education has been provided regarding the importance of this vaccine. Patient has been advised to call insurance company to determine out of pocket expense if they have not yet received this vaccine.  Advised may also receive  vaccine at local pharmacy or Health Dept. Verbalized acceptance and understanding.  Screening Tests Health Maintenance  Topic Date Due   Zoster Vaccines- Shingrix (1 of 2) Never done   COVID-19 Vaccine (4 - Booster) 07/29/2020   INFLUENZA VACCINE  11/27/2020   TETANUS/TDAP  06/13/2025   DEXA SCAN  Completed   PNA vac Low Risk Adult  Completed   HPV VACCINES  Aged Out    Health Maintenance  Health Maintenance Due  Topic Date Due   Zoster Vaccines- Shingrix (1 of 2) Never done   COVID-19 Vaccine (4 - Booster) 07/29/2020    Colorectal cancer screening: No longer required.   Mammogram status: No longer required due to age.  Bone Density status: declined  Lung Cancer Screening: (Low Dose CT Chest recommended if Age 45-80 years, 30 pack-year currently smoking OR have quit w/in 15years.) does not qualify.   Additional Screening:  Hepatitis C Screening: does not qualify; Completed N/A  Vision Screening: Recommended annual ophthalmology exams for early detection of glaucoma and other disorders of the eye. Is the patient up to date with their annual eye exam?  Yes  Who is the provider or what is the name of the office in which the patient attends annual eye exams? [facility], Dr. Wallace Going If pt is not established with a provider, would they like to be referred to a provider to establish care? No .   Dental Screening: Recommended annual dental exams for proper oral hygiene  Community Resource Referral / Chronic Care Management: CRR required this visit?  No   CCM required this visit?  No      Plan:     I have personally reviewed and noted the following in the patient's chart:   Medical and social history Use of alcohol, tobacco or illicit drugs  Current medications and supplements including opioid prescriptions.  Functional ability and status Nutritional status Physical activity Advanced directives List of other physicians Hospitalizations, surgeries, and  ER visits in previous 12 months Vitals Screenings to include cognitive, depression, and falls Referrals and appointments  In addition, I have reviewed and discussed with patient certain preventive protocols, quality metrics, and best practice recommendations. A written personalized care plan for preventive services as well as general preventive health recommendations were provided to patient.   Due to this being a telephonic visit, the after visit summary with patients personalized plan was offered to patient via office or my-chart. Patient preferred to pick up at office at next visit or via mychart.   Andrez Grime, LPN   11/27/173

## 2020-11-03 NOTE — Patient Instructions (Signed)
Anne Ferrell , Thank you for taking time to come for your Medicare Wellness Visit. I appreciate your ongoing commitment to your health goals. Please review the following plan we discussed and let me know if I can assist you in the future.   Screening recommendations/referrals: Colonoscopy: no longer required  Mammogram: no longer required  Bone Density: declined Recommended yearly ophthalmology/optometry visit for glaucoma screening and checkup Recommended yearly dental visit for hygiene and checkup  Vaccinations: Influenza vaccine: Up to date, completed 03/20/2020, due 11/2020 Pneumococcal vaccine: Completed series Tdap vaccine: Up to date, completed 06/14/2015, due 05/2025 Shingles vaccine: due, check with your insurance regarding coverage if interested    Covid-19:completed 3 vaccines  Advanced directives: Please bring a copy of your POA (Power of Attorney) and/or Living Will to your next appointment.   Conditions/risks identified: hypertension  Next appointment: Follow up in one year for your annual wellness visit    Preventive Care 34 Years and Older, Female Preventive care refers to lifestyle choices and visits with your health care provider that can promote health and wellness. What does preventive care include? A yearly physical exam. This is also called an annual well check. Dental exams once or twice a year. Routine eye exams. Ask your health care provider how often you should have your eyes checked. Personal lifestyle choices, including: Daily care of your teeth and gums. Regular physical activity. Eating a healthy diet. Avoiding tobacco and drug use. Limiting alcohol use. Practicing safe sex. Taking low-dose aspirin every day. Taking vitamin and mineral supplements as recommended by your health care provider. What happens during an annual well check? The services and screenings done by your health care provider during your annual well check will depend on your age,  overall health, lifestyle risk factors, and family history of disease. Counseling  Your health care provider may ask you questions about your: Alcohol use. Tobacco use. Drug use. Emotional well-being. Home and relationship well-being. Sexual activity. Eating habits. History of falls. Memory and ability to understand (cognition). Work and work Statistician. Reproductive health. Screening  You may have the following tests or measurements: Height, weight, and BMI. Blood pressure. Lipid and cholesterol levels. These may be checked every 5 years, or more frequently if you are over 16 years old. Skin check. Lung cancer screening. You may have this screening every year starting at age 85 if you have a 30-pack-year history of smoking and currently smoke or have quit within the past 15 years. Fecal occult blood test (FOBT) of the stool. You may have this test every year starting at age 85. Flexible sigmoidoscopy or colonoscopy. You may have a sigmoidoscopy every 5 years or a colonoscopy every 10 years starting at age 85. Hepatitis C blood test. Hepatitis B blood test. Sexually transmitted disease (STD) testing. Diabetes screening. This is done by checking your blood sugar (glucose) after you have not eaten for a while (fasting). You may have this done every 1-3 years. Bone density scan. This is done to screen for osteoporosis. You may have this done starting at age 85. Mammogram. This may be done every 1-2 years. Talk to your health care provider about how often you should have regular mammograms. Talk with your health care provider about your test results, treatment options, and if necessary, the need for more tests. Vaccines  Your health care provider may recommend certain vaccines, such as: Influenza vaccine. This is recommended every year. Tetanus, diphtheria, and acellular pertussis (Tdap, Td) vaccine. You may need a Td booster  every 10 years. Zoster vaccine. You may need this after age  85. Pneumococcal 13-valent conjugate (PCV13) vaccine. One dose is recommended after age 85. Pneumococcal polysaccharide (PPSV23) vaccine. One dose is recommended after age 85. Talk to your health care provider about which screenings and vaccines you need and how often you need them. This information is not intended to replace advice given to you by your health care provider. Make sure you discuss any questions you have with your health care provider. Document Released: 05/12/2015 Document Revised: 01/03/2016 Document Reviewed: 02/14/2015 Elsevier Interactive Patient Education  2017 [place] Prevention in the Home Falls can cause injuries. They can happen to people of all ages. There are many things you can do to make your home safe and to help prevent falls. What can I do on the outside of my home? Regularly fix the edges of walkways and driveways and fix any cracks. Remove anything that might make you trip as you walk through a door, such as a raised step or threshold. Trim any bushes or trees on the path to your home. Use bright outdoor lighting. Clear any walking paths of anything that might make someone trip, such as rocks or tools. Regularly check to see if handrails are loose or broken. Make sure that both sides of any steps have handrails. Any raised decks and porches should have guardrails on the edges. Have any leaves, snow, or ice cleared regularly. Use sand or salt on walking paths during winter. Clean up any spills in your garage right away. This includes oil or grease spills. What can I do in the bathroom? Use night lights. Install grab bars by the toilet and in the tub and shower. Do not use towel bars as grab bars. Use non-skid mats or decals in the tub or shower. If you need to sit down in the shower, use a plastic, non-slip stool. Keep the floor dry. Clean up any water that spills on the floor as soon as it happens. Remove soap buildup in the tub or shower  regularly. Attach bath mats securely with double-sided non-slip rug tape. Do not have throw rugs and other things on the floor that can make you trip. What can I do in the bedroom? Use night lights. Make sure that you have a light by your bed that is easy to reach. Do not use any sheets or blankets that are too big for your bed. They should not hang down onto the floor. Have a firm chair that has side arms. You can use this for support while you get dressed. Do not have throw rugs and other things on the floor that can make you trip. What can I do in the kitchen? Clean up any spills right away. Avoid walking on wet floors. Keep items that you use a lot in easy-to-reach places. If you need to reach something above you, use a strong step stool that has a grab bar. Keep electrical cords out of the way. Do not use floor polish or wax that makes floors slippery. If you must use wax, use non-skid floor wax. Do not have throw rugs and other things on the floor that can make you trip. What can I do with my stairs? Do not leave any items on the stairs. Make sure that there are handrails on both sides of the stairs and use them. Fix handrails that are broken or loose. Make sure that handrails are as long as the stairways. Check any carpeting to  make sure that it is firmly attached to the stairs. Fix any carpet that is loose or worn. Avoid having throw rugs at the top or bottom of the stairs. If you do have throw rugs, attach them to the floor with carpet tape. Make sure that you have a light switch at the top of the stairs and the bottom of the stairs. If you do not have them, ask someone to add them for you. What else can I do to help prevent falls? Wear shoes that: Do not have high heels. Have rubber bottoms. Are comfortable and fit you well. Are closed at the toe. Do not wear sandals. If you use a stepladder: Make sure that it is fully opened. Do not climb a closed stepladder. Make sure that  both sides of the stepladder are locked into place. Ask someone to hold it for you, if possible. Clearly mark and make sure that you can see: Any grab bars or handrails. First and last steps. Where the edge of each step is. Use tools that help you move around (mobility aids) if they are needed. These include: Canes. Walkers. Scooters. Crutches. Turn on the lights when you go into a dark area. Replace any light bulbs as soon as they burn out. Set up your furniture so you have a clear path. Avoid moving your furniture around. If any of your floors are uneven, fix them. If there are any pets around you, be aware of where they are. Review your medicines with your doctor. Some medicines can make you feel dizzy. This can increase your chance of falling. Ask your doctor what other things that you can do to help prevent falls. This information is not intended to replace advice given to you by your health care provider. Make sure you discuss any questions you have with your health care provider. Document Released: 02/09/2009 Document Revised: 09/21/2015 Document Reviewed: 05/20/2014 Elsevier Interactive Patient Education  2017 [employer].

## 2020-11-06 ENCOUNTER — Other Ambulatory Visit: Payer: Self-pay

## 2020-11-06 ENCOUNTER — Other Ambulatory Visit: Payer: Self-pay | Admitting: Family Medicine

## 2020-11-06 ENCOUNTER — Other Ambulatory Visit (INDEPENDENT_AMBULATORY_CARE_PROVIDER_SITE_OTHER): Payer: Medicare Other

## 2020-11-06 DIAGNOSIS — Z79899 Other long term (current) drug therapy: Secondary | ICD-10-CM

## 2020-11-06 DIAGNOSIS — R7309 Other abnormal glucose: Secondary | ICD-10-CM

## 2020-11-06 DIAGNOSIS — E785 Hyperlipidemia, unspecified: Secondary | ICD-10-CM

## 2020-11-06 DIAGNOSIS — E559 Vitamin D deficiency, unspecified: Secondary | ICD-10-CM

## 2020-11-06 LAB — BASIC METABOLIC PANEL
BUN: 21 mg/dL (ref 6–23)
CO2: 28 mEq/L (ref 19–32)
Calcium: 9.1 mg/dL (ref 8.4–10.5)
Chloride: 103 mEq/L (ref 96–112)
Creatinine, Ser: 0.74 mg/dL (ref 0.40–1.20)
GFR: 73.29 mL/min (ref >60.00)
Glucose, Bld: 101 mg/dL — ABNORMAL HIGH (ref 70–99)
Potassium: 4 mEq/L (ref 3.5–5.1)
Sodium: 139 mEq/L (ref 135–145)

## 2020-11-06 LAB — HEMOGLOBIN A1C: Hgb A1c MFr Bld: 5.9 % (ref 4.6–6.5)

## 2020-11-06 LAB — LIPID PANEL
Cholesterol: 227 mg/dL — ABNORMAL HIGH (ref 0–200)
HDL: 85.7 mg/dL (ref >39.00)
LDL Cholesterol: 126 mg/dL — ABNORMAL HIGH (ref 0–99)
NonHDL: 141.67
Total CHOL/HDL Ratio: 3
Triglycerides: 77 mg/dL (ref 0.0–149.0)
VLDL: 15.4 mg/dL (ref 0.0–40.0)

## 2020-11-06 LAB — CBC WITH DIFFERENTIAL/PLATELET
Basophils Absolute: 0.1 10*3/uL (ref 0.0–0.1)
Basophils Relative: 1.8 % (ref 0.0–3.0)
Eosinophils Absolute: 0.1 10*3/uL (ref 0.0–0.7)
Eosinophils Relative: 2.2 % (ref 0.0–5.0)
HCT: 42.3 % (ref 36.0–46.0)
Hemoglobin: 14.2 g/dL (ref 12.0–15.0)
Lymphocytes Relative: 16.8 % (ref 12.0–46.0)
Lymphs Abs: 0.8 10*3/uL (ref 0.7–4.0)
MCHC: 33.5 g/dL (ref 30.0–36.0)
MCV: 93.4 fl (ref 78.0–100.0)
Monocytes Absolute: 0.5 10*3/uL (ref 0.1–1.0)
Monocytes Relative: 11.6 % (ref 3.0–12.0)
Neutro Abs: 3.2 10*3/uL (ref 1.4–7.7)
Neutrophils Relative %: 67.6 % (ref 43.0–77.0)
Platelets: 239 10*3/uL (ref 150.0–400.0)
RBC: 4.53 Mil/uL (ref 3.87–5.11)
RDW: 13.3 % (ref 11.5–15.5)
WBC: 4.7 10*3/uL (ref 4.0–10.5)

## 2020-11-06 LAB — HEPATIC FUNCTION PANEL
ALT: 20 U/L (ref 0–35)
AST: 26 U/L (ref 0–37)
Albumin: 4.3 g/dL (ref 3.5–5.2)
Alkaline Phosphatase: 67 U/L (ref 39–117)
Bilirubin, Direct: 0.1 mg/dL (ref 0.0–0.3)
Total Bilirubin: 0.8 mg/dL (ref 0.2–1.2)
Total Protein: 6.4 g/dL (ref 6.0–8.3)

## 2020-11-06 LAB — VITAMIN D 25 HYDROXY (VIT D DEFICIENCY, FRACTURES): VITD: 39.5 ng/mL (ref 30.00–100.00)

## 2020-11-13 ENCOUNTER — Encounter: Payer: Self-pay | Admitting: Family Medicine

## 2020-11-13 ENCOUNTER — Ambulatory Visit (INDEPENDENT_AMBULATORY_CARE_PROVIDER_SITE_OTHER): Payer: Medicare Other | Admitting: Family Medicine

## 2020-11-13 ENCOUNTER — Other Ambulatory Visit: Payer: Self-pay

## 2020-11-13 VITALS — BP 124/76 | HR 67 | Temp 98.0°F | Ht 69.5 in | Wt 129.0 lb

## 2020-11-13 DIAGNOSIS — E559 Vitamin D deficiency, unspecified: Secondary | ICD-10-CM | POA: Diagnosis not present

## 2020-11-13 DIAGNOSIS — I10 Essential (primary) hypertension: Secondary | ICD-10-CM | POA: Diagnosis not present
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
## 2020-11-14 DIAGNOSIS — H401132 Primary open-angle glaucoma, bilateral, moderate stage: Secondary | ICD-10-CM | POA: Diagnosis not present

## 2020-12-22 ENCOUNTER — Emergency Department: Payer: Medicare Other

## 2020-12-22 ENCOUNTER — Encounter: Payer: Self-pay | Admitting: Internal Medicine

## 2020-12-22 ENCOUNTER — Inpatient Hospital Stay
Admission: EM | Admit: 2020-12-22 | Discharge: 2020-12-27 | DRG: 469 | Disposition: A | Discharge status: Skilled Nursing Facility | Payer: Medicare Other | Attending: Hospitalist | Admitting: Hospitalist

## 2020-12-22 ENCOUNTER — Other Ambulatory Visit: Payer: Self-pay | Admitting: Radiology

## 2020-12-22 ENCOUNTER — Other Ambulatory Visit: Payer: Self-pay

## 2020-12-22 DIAGNOSIS — Y92009 Unspecified place in unspecified non-institutional (private) residence as the place of occurrence of the external cause: Secondary | ICD-10-CM | POA: Diagnosis not present

## 2020-12-22 DIAGNOSIS — Z888 Allergy status to other drugs, medicaments and biological substances status: Secondary | ICD-10-CM | POA: Diagnosis not present

## 2020-12-22 DIAGNOSIS — W19XXXA Unspecified fall, initial encounter: Secondary | ICD-10-CM

## 2020-12-22 DIAGNOSIS — Z01818 Encounter for other preprocedural examination: Secondary | ICD-10-CM | POA: Diagnosis not present

## 2020-12-22 DIAGNOSIS — R279 Unspecified lack of coordination: Secondary | ICD-10-CM | POA: Diagnosis not present

## 2020-12-22 DIAGNOSIS — J984 Other disorders of lung: Secondary | ICD-10-CM | POA: Diagnosis not present

## 2020-12-22 DIAGNOSIS — E559 Vitamin D deficiency, unspecified: Secondary | ICD-10-CM | POA: Diagnosis not present

## 2020-12-22 DIAGNOSIS — S72001A Fracture of unspecified part of neck of right femur, initial encounter for closed fracture: Secondary | ICD-10-CM | POA: Diagnosis present

## 2020-12-22 DIAGNOSIS — Z79899 Other long term (current) drug therapy: Secondary | ICD-10-CM

## 2020-12-22 DIAGNOSIS — Z91018 Allergy to other foods: Secondary | ICD-10-CM

## 2020-12-22 DIAGNOSIS — Z809 Family history of malignant neoplasm, unspecified: Secondary | ICD-10-CM

## 2020-12-22 DIAGNOSIS — Y92012 Bathroom of single-family (private) house as the place of occurrence of the external cause: Secondary | ICD-10-CM

## 2020-12-22 DIAGNOSIS — D62 Acute posthemorrhagic anemia: Secondary | ICD-10-CM | POA: Diagnosis not present

## 2020-12-22 DIAGNOSIS — S52351A Displaced comminuted fracture of shaft of radius, right arm, initial encounter for closed fracture: Secondary | ICD-10-CM | POA: Diagnosis not present

## 2020-12-22 DIAGNOSIS — G319 Degenerative disease of nervous system, unspecified: Secondary | ICD-10-CM | POA: Diagnosis not present

## 2020-12-22 DIAGNOSIS — S72031A Displaced midcervical fracture of right femur, initial encounter for closed fracture: Secondary | ICD-10-CM | POA: Diagnosis not present

## 2020-12-22 DIAGNOSIS — I10 Essential (primary) hypertension: Secondary | ICD-10-CM

## 2020-12-22 DIAGNOSIS — R5381 Other malaise: Secondary | ICD-10-CM | POA: Diagnosis not present

## 2020-12-22 DIAGNOSIS — M47812 Spondylosis without myelopathy or radiculopathy, cervical region: Secondary | ICD-10-CM | POA: Diagnosis not present

## 2020-12-22 DIAGNOSIS — M25421 Effusion, right elbow: Secondary | ICD-10-CM | POA: Diagnosis not present

## 2020-12-22 DIAGNOSIS — M810 Age-related osteoporosis without current pathological fracture: Secondary | ICD-10-CM | POA: Diagnosis present

## 2020-12-22 DIAGNOSIS — Z20822 Contact with and (suspected) exposure to covid-19: Secondary | ICD-10-CM | POA: Diagnosis not present

## 2020-12-22 DIAGNOSIS — T148XXA Other injury of unspecified body region, initial encounter: Secondary | ICD-10-CM | POA: Diagnosis present

## 2020-12-22 DIAGNOSIS — S52021A Displaced fracture of olecranon process without intraarticular extension of right ulna, initial encounter for closed fracture: Principal | ICD-10-CM | POA: Diagnosis present

## 2020-12-22 DIAGNOSIS — S62101A Fracture of unspecified carpal bone, right wrist, initial encounter for closed fracture: Secondary | ICD-10-CM | POA: Diagnosis not present

## 2020-12-22 DIAGNOSIS — S52601A Unspecified fracture of lower end of right ulna, initial encounter for closed fracture: Secondary | ICD-10-CM | POA: Diagnosis present

## 2020-12-22 DIAGNOSIS — S72011D Unspecified intracapsular fracture of right femur, subsequent encounter for closed fracture with routine healing: Secondary | ICD-10-CM | POA: Diagnosis not present

## 2020-12-22 DIAGNOSIS — S52601D Unspecified fracture of lower end of right ulna, subsequent encounter for closed fracture with routine healing: Secondary | ICD-10-CM | POA: Diagnosis not present

## 2020-12-22 DIAGNOSIS — Z471 Aftercare following joint replacement surgery: Secondary | ICD-10-CM | POA: Diagnosis not present

## 2020-12-22 DIAGNOSIS — Z881 Allergy status to other antibiotic agents status: Secondary | ICD-10-CM | POA: Diagnosis not present

## 2020-12-22 DIAGNOSIS — M4312 Spondylolisthesis, cervical region: Secondary | ICD-10-CM | POA: Diagnosis not present

## 2020-12-22 DIAGNOSIS — W1830XA Fall on same level, unspecified, initial encounter: Secondary | ICD-10-CM | POA: Diagnosis present

## 2020-12-22 DIAGNOSIS — R41841 Cognitive communication deficit: Secondary | ICD-10-CM | POA: Diagnosis not present

## 2020-12-22 DIAGNOSIS — S0990XA Unspecified injury of head, initial encounter: Secondary | ICD-10-CM | POA: Diagnosis not present

## 2020-12-22 DIAGNOSIS — Z90710 Acquired absence of both cervix and uterus: Secondary | ICD-10-CM

## 2020-12-22 DIAGNOSIS — S199XXA Unspecified injury of neck, initial encounter: Secondary | ICD-10-CM | POA: Diagnosis not present

## 2020-12-22 DIAGNOSIS — N390 Urinary tract infection, site not specified: Secondary | ICD-10-CM | POA: Diagnosis not present

## 2020-12-22 DIAGNOSIS — S52501A Unspecified fracture of the lower end of right radius, initial encounter for closed fracture: Secondary | ICD-10-CM | POA: Diagnosis not present

## 2020-12-22 DIAGNOSIS — S72011A Unspecified intracapsular fracture of right femur, initial encounter for closed fracture: Secondary | ICD-10-CM | POA: Diagnosis not present

## 2020-12-22 DIAGNOSIS — S52031A Displaced fracture of olecranon process with intraarticular extension of right ulna, initial encounter for closed fracture: Secondary | ICD-10-CM | POA: Diagnosis not present

## 2020-12-22 DIAGNOSIS — Z96649 Presence of unspecified artificial hip joint: Secondary | ICD-10-CM

## 2020-12-22 DIAGNOSIS — S52251A Displaced comminuted fracture of shaft of ulna, right arm, initial encounter for closed fracture: Secondary | ICD-10-CM | POA: Diagnosis not present

## 2020-12-22 DIAGNOSIS — S52501D Unspecified fracture of the lower end of right radius, subsequent encounter for closed fracture with routine healing: Secondary | ICD-10-CM | POA: Diagnosis not present

## 2020-12-22 DIAGNOSIS — R262 Difficulty in walking, not elsewhere classified: Secondary | ICD-10-CM | POA: Diagnosis not present

## 2020-12-22 DIAGNOSIS — S42401A Unspecified fracture of lower end of right humerus, initial encounter for closed fracture: Secondary | ICD-10-CM | POA: Diagnosis not present

## 2020-12-22 DIAGNOSIS — Z96641 Presence of right artificial hip joint: Secondary | ICD-10-CM | POA: Diagnosis not present

## 2020-12-22 DIAGNOSIS — Z981 Arthrodesis status: Secondary | ICD-10-CM | POA: Diagnosis not present

## 2020-12-22 DIAGNOSIS — Z4889 Encounter for other specified surgical aftercare: Secondary | ICD-10-CM | POA: Diagnosis not present

## 2020-12-22 DIAGNOSIS — M6281 Muscle weakness (generalized): Secondary | ICD-10-CM | POA: Diagnosis not present

## 2020-12-22 DIAGNOSIS — S52033D Displaced fracture of olecranon process with intraarticular extension of unspecified ulna, subsequent encounter for closed fracture with routine healing: Secondary | ICD-10-CM | POA: Diagnosis not present

## 2020-12-22 LAB — CBC WITH DIFFERENTIAL/PLATELET
Abs Immature Granulocytes: 0.13 10*3/uL — ABNORMAL HIGH (ref 0.00–0.07)
Basophils Absolute: 0.1 10*3/uL (ref 0.0–0.1)
Basophils Relative: 0 %
Eosinophils Absolute: 0 10*3/uL (ref 0.0–0.5)
Eosinophils Relative: 0 %
HCT: 37.6 % (ref 36.0–46.0)
Hemoglobin: 12.7 g/dL (ref 12.0–15.0)
Immature Granulocytes: 1 %
Lymphocytes Relative: 3 %
Lymphs Abs: 0.5 10*3/uL — ABNORMAL LOW (ref 0.7–4.0)
MCH: 32 pg (ref 26.0–34.0)
MCHC: 33.8 g/dL (ref 30.0–36.0)
MCV: 94.7 fL (ref 80.0–100.0)
Monocytes Absolute: 0.8 10*3/uL (ref 0.1–1.0)
Monocytes Relative: 5 %
Neutro Abs: 16.3 10*3/uL — ABNORMAL HIGH (ref 1.7–7.7)
Neutrophils Relative %: 91 %
Platelets: 210 10*3/uL (ref 150–400)
RBC: 3.97 MIL/uL (ref 3.87–5.11)
RDW: 12.8 % (ref 11.5–15.5)
WBC: 17.8 10*3/uL — ABNORMAL HIGH (ref 4.0–10.5)
nRBC: 0 % (ref 0.0–0.2)

## 2020-12-22 LAB — COMPREHENSIVE METABOLIC PANEL
ALT: 27 U/L (ref 0–44)
AST: 30 U/L (ref 15–41)
Albumin: 3.7 g/dL (ref 3.5–5.0)
Alkaline Phosphatase: 69 U/L (ref 38–126)
Anion gap: 7 (ref 5–15)
BUN: 25 mg/dL — ABNORMAL HIGH (ref 8–23)
CO2: 29 mmol/L (ref 22–32)
Calcium: 8.4 mg/dL — ABNORMAL LOW (ref 8.9–10.3)
Chloride: 101 mmol/L (ref 98–111)
Creatinine, Ser: 0.72 mg/dL (ref 0.44–1.00)
GFR, Estimated: >60 mL/min (ref >60)
Glucose, Bld: 130 mg/dL — ABNORMAL HIGH (ref 70–99)
Potassium: 4 mmol/L (ref 3.5–5.1)
Sodium: 137 mmol/L (ref 135–145)
Total Bilirubin: 1.1 mg/dL (ref 0.3–1.2)
Total Protein: 5.7 g/dL — ABNORMAL LOW (ref 6.5–8.1)

## 2020-12-22 LAB — TYPE AND SCREEN
ABO/RH(D): O POS
Antibody Screen: NEGATIVE

## 2020-12-22 LAB — RESP PANEL BY RT-PCR (FLU A&B, COVID) ARPGX2
Influenza A by PCR: NEGATIVE
Influenza B by PCR: NEGATIVE
SARS Coronavirus 2 by RT PCR: NEGATIVE

## 2020-12-22 LAB — PROTIME-INR
INR: 1.1 (ref 0.8–1.2)
Prothrombin Time: 14.6 seconds (ref 11.4–15.2)

## 2020-12-22 LAB — APTT: aPTT: 28 seconds (ref 24–36)

## 2020-12-22 MED ORDER — SENNOSIDES-DOCUSATE SODIUM 8.6-50 MG PO TABS: 1.0000 | ORAL_TABLET | Freq: Every evening | ORAL | Status: DC | PRN

## 2020-12-22 MED ORDER — VITAMIN B-12 1000 MCG PO TABS
1000.0000 ug | ORAL_TABLET | Freq: Every day | ORAL | Status: DC
  Administered 2020-12-23 – 2020-12-27 (×5): 1000 ug via ORAL
  Filled 2020-12-22 (×5): qty 1

## 2020-12-22 MED ORDER — MAGNESIUM 250 MG PO TABS: 2.0000 | ORAL_TABLET | Freq: Every day | ORAL | Status: DC

## 2020-12-22 MED ORDER — CLINDAMYCIN PHOSPHATE 600 MG/50ML IV SOLN: 600.0000 mg | Freq: Three times a day (TID) | INTRAVENOUS | Status: DC

## 2020-12-22 MED ORDER — VITAMIN D 25 MCG (1000 UNIT) PO TABS
1000.0000 [IU] | ORAL_TABLET | Freq: Every day | ORAL | Status: DC
  Administered 2020-12-24 – 2020-12-27 (×4): 1000 [IU] via ORAL
  Filled 2020-12-22 (×4): qty 1

## 2020-12-22 MED ORDER — METHOCARBAMOL 500 MG PO TABS
500.0000 mg | ORAL_TABLET | Freq: Three times a day (TID) | ORAL | Status: DC | PRN
  Filled 2020-12-22: qty 1

## 2020-12-22 MED ORDER — ATENOLOL 25 MG PO TABS
25.0000 mg | ORAL_TABLET | Freq: Every day | ORAL | Status: DC
  Administered 2020-12-24 – 2020-12-27 (×4): 25 mg via ORAL
  Filled 2020-12-22 (×5): qty 1

## 2020-12-22 MED ORDER — MORPHINE SULFATE (PF) 2 MG/ML IV SOLN
1.0000 mg | INTRAVENOUS | Status: DC | PRN
  Administered 2020-12-22 – 2020-12-23 (×3): 1 mg via INTRAVENOUS
  Filled 2020-12-22 (×3): qty 1

## 2020-12-22 MED ORDER — SODIUM CHLORIDE 0.9 % IV SOLN: INTRAVENOUS | Status: DC

## 2020-12-22 MED ORDER — HYDRALAZINE HCL 20 MG/ML IJ SOLN: 5.0000 mg | INTRAMUSCULAR | Status: DC | PRN

## 2020-12-22 MED ORDER — MORPHINE SULFATE (PF) 2 MG/ML IV SOLN: 1.0000 mg | INTRAVENOUS | Status: DC | PRN

## 2020-12-22 MED ORDER — OXYCODONE-ACETAMINOPHEN 5-325 MG PO TABS
1.0000 | ORAL_TABLET | ORAL | Status: DC | PRN
  Administered 2020-12-23 – 2020-12-26 (×3): 1 via ORAL
  Filled 2020-12-22 (×4): qty 1

## 2020-12-22 MED ORDER — PRESERVISION AREDS PO CAPS: 1.0000 | ORAL_CAPSULE | Freq: Every day | ORAL | Status: DC

## 2020-12-22 MED ORDER — CEFAZOLIN SODIUM-DEXTROSE 1-4 GM/50ML-% IV SOLN
1.0000 g | INTRAVENOUS | Status: AC
  Administered 2020-12-23: 2 g via INTRAVENOUS
  Filled 2020-12-22: qty 50

## 2020-12-22 MED ORDER — OCUVITE-LUTEIN PO CAPS
1.0000 | ORAL_CAPSULE | Freq: Every day | ORAL | Status: DC
  Administered 2020-12-23 – 2020-12-27 (×5): 1 via ORAL
  Filled 2020-12-22 (×5): qty 1

## 2020-12-22 MED ORDER — CLINDAMYCIN PHOSPHATE 600 MG/50ML IV SOLN
600.0000 mg | INTRAVENOUS | Status: AC
  Administered 2020-12-23: 600 mg via INTRAVENOUS
  Filled 2020-12-22: qty 50

## 2020-12-22 MED ORDER — ACETAMINOPHEN 325 MG PO TABS
650.0000 mg | ORAL_TABLET | Freq: Four times a day (QID) | ORAL | Status: DC | PRN
Start: 2020-12-22 — End: 2020-12-27
  Administered 2020-12-25: 650 mg via ORAL
  Filled 2020-12-22: qty 2

## 2020-12-22 MED ORDER — CHLORHEXIDINE GLUCONATE 4 % EX LIQD: 1.0000 "application " | Freq: Once | CUTANEOUS | Status: DC

## 2020-12-22 MED ORDER — ONDANSETRON HCL 4 MG/2ML IJ SOLN
4.0000 mg | Freq: Three times a day (TID) | INTRAMUSCULAR | Status: DC | PRN
  Filled 2020-12-22: qty 2

## 2020-12-22 MED ORDER — VITAMIN A 3 MG (10000 UNIT) PO CAPS
10000.0000 [IU] | ORAL_CAPSULE | Freq: Every day | ORAL | Status: DC
  Administered 2020-12-23 – 2020-12-27 (×5): 10000 [IU] via ORAL
  Filled 2020-12-22 (×5): qty 1

## 2020-12-22 MED ORDER — MAGNESIUM OXIDE -MG SUPPLEMENT 400 (240 MG) MG PO TABS
400.0000 mg | ORAL_TABLET | Freq: Two times a day (BID) | ORAL | Status: DC
  Administered 2020-12-22 – 2020-12-27 (×10): 400 mg via ORAL
  Filled 2020-12-22 (×10): qty 1

## 2020-12-22 MED ORDER — VITAMIN A 3 MG (10000 UNIT) PO CAPS: 10000.0000 [IU] | ORAL_CAPSULE | Freq: Every day | ORAL | Status: DC

## 2020-12-22 NOTE — ED Notes (Signed)
Per lab, type and screen collected previously is hemolyzed. Will send phlebotomy tech to recollect.

## 2020-12-22 NOTE — H&P (Signed)
ORTHOPAEDIC CONSULTATION  REQUESTING PHYSICIAN: Lavonia Drafts, MD  Chief Complaint: Right hip and arm pain  HPI: Anne Ferrell is a 87 y.o. female who complains of right hip and arm pain following a fall at home today.  She was brought to the emergency room where exam and x-rays revealed a displaced subcapital fracture of the right hip.  In addition she has a moderately displaced distal right radius and ulna fracture and a comminuted displaced olecranon fracture.  Discussed treatment with the patient and her son.  I recommended a hemiarthroplasty for the right hip and a open reduction of the olecranon.  I believe the close reduction of the wrist will be satisfactory.  Risks and benefits of surgery and postop protocols were discussed with them.  I would like to get her hydrated today and proceed with surgery in the morning.  They are agreeable to this.  Past Medical History:  Diagnosis Date   Arthritis    Diverticulitis    Glaucoma    Heart murmur    High blood pressure    Osteoporosis 10/05/2015   Vitamin D deficiency 09/18/2015   Past Surgical History:  Procedure Laterality Date   ABDOMINAL HYSTERECTOMY  2008   cataract surgery Bilateral 2011   TONSILLECTOMY AND ADENOIDECTOMY  1941   Social History   Socioeconomic History   Marital status: Widowed    Spouse name: Not on file   Number of children: Not on file   Years of education: Not on file   Highest education level: Not on file  Occupational History   Not on file  Tobacco Use   Smoking status: Never   Smokeless tobacco: Never  Vaping Use   Vaping Use: Never used  Substance and Sexual Activity   Alcohol use: Yes    Alcohol/week: 7.0 standard drinks    Types: 7 Glasses of wine per week   Drug use: No   Sexual activity: Not Currently  Other Topics Concern   Not on file  Social History Narrative   Mother of Koleen Nimrod   Social Determinants of Health   Financial Resource Strain: Low Risk    Difficulty of  Paying Living Expenses: Not hard at all  Food Insecurity: No Food Insecurity   Worried About Charity fundraiser in the Last Year: Never true   Arboriculturist in the Last Year: Never true  Transportation Needs: No Transportation Needs   Lack of Transportation (Medical): No   Lack of Transportation (Non-Medical): No  Physical Activity: Insufficiently Active   Days of Exercise per Week: 7 days   Minutes of Exercise per Session: 20 min  Stress: No Stress Concern Present   Feeling of Stress : Not at all  Social Connections: Not on file   Family History  Problem Relation Age of Onset   Cancer Father        prostate   Colon cancer Neg Hx    Allergies  Allergen Reactions   Anise Flavor Other (See Comments)    Feels like she is going to pass out   Licorice Flavor [Flavoring Agent] Palpitations   Prior to Admission medications   Medication Sig Start Date End Date Taking? Authorizing Provider  atenolol (TENORMIN) 25 MG tablet Take 1 tablet by mouth once daily 01/12/20   Copland, Frederico Hamman, MD  Magnesium 250 MG TABS Take 1 tablet by mouth daily.    [provider]  Multiple Vitamins-Minerals (PRESERVISION AREDS) CAPS Take 1 capsule by mouth daily.  [provider]  VITAMIN A PO Take 5,000 Units by mouth daily.    [provider]  vitamin B-12 (CYANOCOBALAMIN) 1000 MCG tablet Take 1,000 mcg by mouth daily.    [provider]  Vitamin D, Cholecalciferol, 25 MCG (1000 UT) TABS Take 1 tablet by mouth daily.    [provider]   DG Wrist Complete Right  Result Date: 12/22/2020 CLINICAL DATA:  Right wrist pain after fall. EXAM: RIGHT WRIST - COMPLETE 3+ VIEW COMPARISON:  None. FINDINGS: Moderately displaced and comminuted fractures are seen involving the distal right radius and ulna. Moderate osteoarthritis is seen involving the first carpometacarpal joint. IMPRESSION: Moderately displaced and comminuted distal right radial and ulnar fractures.  Electronically Signed   By: Marijo Conception M.D.   On: 12/22/2020 11:02   CT HEAD WO CONTRAST (5MM)  Result Date: 12/22/2020 CLINICAL DATA:  Head trauma, minor (Age >= 65y); Neck trauma (Age >= 65y) EXAM: CT HEAD WITHOUT CONTRAST CT CERVICAL SPINE WITHOUT CONTRAST TECHNIQUE: Multidetector CT imaging of the head and cervical spine was performed following the standard protocol without intravenous contrast. Multiplanar CT image reconstructions of the cervical spine were also generated. COMPARISON:  None. FINDINGS: CT HEAD FINDINGS Brain: No evidence of acute large vascular territory infarction, hemorrhage, hydrocephalus, extra-axial collection or mass lesion/mass effect. Mild to moderate patchy white matter hypoattenuation, likely related to chronic microvascular ischemic disease. Generalized mild to moderate atrophy with ex vacuo ventricular dilation. Streak artifact limits evaluation of the posterior fossa. Vascular: No hyperdense vessel identified. Calcific intracranial atherosclerosis. Skull: No acute fracture. Sinuses/Orbits: Clear visualized sinuses. No acute orbital findings. Other: No mastoid effusions. CT CERVICAL SPINE FINDINGS Alignment: Straightening of the normal cervical lordosis. Mild anterolisthesis of C5 on C6, likely degenerative given degenerative changes at this level. Mild dextrocurvature. Skull base and vertebrae: No evidence of acute fracture. Nonspecific discrete lucent lesion involving the posterior elements at C2. Soft tissues and spinal canal: No prevertebral fluid or swelling. No visible canal hematoma. Disc levels: Craniocervical degenerative change. Multilevel degenerative disc disease, greatest and moderate to severe on the left at C6-C7 where there is disc height loss, endplate sclerosis and endplate spurring. Likely at least mild to moderate canal stenosis at this level. Multilevel bulky left facet arthropathy, greatest and severe on the left at C5-C6 where there is osseous fusion  across the facet joint. Ossified posterior ligamentum flavum on the left at T1-T2. Upper chest: Biapical pleuroparenchymal scarring. Otherwise, visualized lung apices are clear. The thyroid is partially imaged with multiple subcentimeter thyroid nodules. Not clinically significant; no follow-up imaging recommended (ref: J Am Coll Radiol. 2015 Feb;12(2): 143-50). Other: Nonspecific small sclerotic lesion in the posterior left rib. IMPRESSION: CT head: 1. No evidence of acute intracranial abnormality. 2. Mild to moderate chronic microvascular ischemic disease and atrophy. CT cervical spine: 1. No evidence of acute fracture or traumatic malalignment. 2. Multilevel moderate to severe degenerative change with likely at least mild to moderate canal stenosis at C6-C7 and bulky multilevel left facet hypertrophy. An MRI of the cervical spine could further evaluate the canal and foramina if clinically indicated. 3. Discrete lucent lesion involving the posterior elements at C2 and small sclerotic lesion involving the posterior left second rib, nonspecific but most likely benign in the absence of a known primary malignancy. Electronically Signed   By: Margaretha Sheffield M.D.   On: 12/22/2020 11:24   CT Cervical Spine Wo Contrast  Result Date: 12/22/2020 CLINICAL DATA:  Head trauma, minor (Age >=  65y); Neck trauma (Age >= 65y) EXAM: CT HEAD WITHOUT CONTRAST CT CERVICAL SPINE WITHOUT CONTRAST TECHNIQUE: Multidetector CT imaging of the head and cervical spine was performed following the standard protocol without intravenous contrast. Multiplanar CT image reconstructions of the cervical spine were also generated. COMPARISON:  None. FINDINGS: CT HEAD FINDINGS Brain: No evidence of acute large vascular territory infarction, hemorrhage, hydrocephalus, extra-axial collection or mass lesion/mass effect. Mild to moderate patchy white matter hypoattenuation, likely related to chronic microvascular ischemic disease. Generalized mild to  moderate atrophy with ex vacuo ventricular dilation. Streak artifact limits evaluation of the posterior fossa. Vascular: No hyperdense vessel identified. Calcific intracranial atherosclerosis. Skull: No acute fracture. Sinuses/Orbits: Clear visualized sinuses. No acute orbital findings. Other: No mastoid effusions. CT CERVICAL SPINE FINDINGS Alignment: Straightening of the normal cervical lordosis. Mild anterolisthesis of C5 on C6, likely degenerative given degenerative changes at this level. Mild dextrocurvature. Skull base and vertebrae: No evidence of acute fracture. Nonspecific discrete lucent lesion involving the posterior elements at C2. Soft tissues and spinal canal: No prevertebral fluid or swelling. No visible canal hematoma. Disc levels: Craniocervical degenerative change. Multilevel degenerative disc disease, greatest and moderate to severe on the left at C6-C7 where there is disc height loss, endplate sclerosis and endplate spurring. Likely at least mild to moderate canal stenosis at this level. Multilevel bulky left facet arthropathy, greatest and severe on the left at C5-C6 where there is osseous fusion across the facet joint. Ossified posterior ligamentum flavum on the left at T1-T2. Upper chest: Biapical pleuroparenchymal scarring. Otherwise, visualized lung apices are clear. The thyroid is partially imaged with multiple subcentimeter thyroid nodules. Not clinically significant; no follow-up imaging recommended (ref: J Am Coll Radiol. 2015 Feb;12(2): 143-50). Other: Nonspecific small sclerotic lesion in the posterior left rib. IMPRESSION: CT head: 1. No evidence of acute intracranial abnormality. 2. Mild to moderate chronic microvascular ischemic disease and atrophy. CT cervical spine: 1. No evidence of acute fracture or traumatic malalignment. 2. Multilevel moderate to severe degenerative change with likely at least mild to moderate canal stenosis at C6-C7 and bulky multilevel left facet  hypertrophy. An MRI of the cervical spine could further evaluate the canal and foramina if clinically indicated. 3. Discrete lucent lesion involving the posterior elements at C2 and small sclerotic lesion involving the posterior left second rib, nonspecific but most likely benign in the absence of a known primary malignancy. Electronically Signed   By: Margaretha Sheffield M.D.   On: 12/22/2020 11:24   DG Hip Unilat W or Wo Pelvis 2-3 Views Right  Result Date: 12/22/2020 CLINICAL DATA:  Right hip pain after fall. EXAM: DG HIP (WITH OR WITHOUT PELVIS) 2-3V RIGHT COMPARISON:  None. FINDINGS: Moderately displaced proximal right femoral neck fracture is noted. IMPRESSION: Moderately displaced proximal right femoral neck fracture. Electronically Signed   By: Marijo Conception M.D.   On: 12/22/2020 11:00    Positive ROS: All other systems have been reviewed and were otherwise negative with the exception of those mentioned in the HPI and as above.  Physical Exam: General: Alert, no acute distress Cardiovascular: No pedal edema Respiratory: No cyanosis, no use of accessory musculature GI: No organomegaly, abdomen is soft and non-tender Skin: No lesions in the area of chief complaint Neurologic: Sensation intact distally Psychiatric: Patient is competent for consent with normal mood and affect Lymphatic: No axillary or cervical lymphadenopathy  MUSCULOSKELETAL: Patient is alert and cooperative.  The right hip has pain with range of motion.  There is minimal  shortening.  Neurovascular status is good distally.  The skin is intact.  The right elbow has severe ecchymosis and swelling.  There is pain and palpation limited movement.  The wrist is tender to touch with skin intact.  Assessment: 1 displaced subcapital fracture right hip 2 displaced comminuted right olecranon fracture 3 displaced distal radius and ulna fracture  Plan: I recommended hemiarthroplasty of the right hip tomorrow as well as an open  reduction of the elbow.  Closed reduction of the wrist will be done as well.    Park Breed, MD [phone]   12/22/2020 12:38 PM

## 2020-12-22 NOTE — ED Triage Notes (Signed)
Pt BIB EMS from home C/O right wrist injury following a trip and fall. Pt denies hitting head, no LOC, not on any blood thinners, ambulatory on scene. Pt endorses right hip pain when moving.

## 2020-12-22 NOTE — ED Provider Notes (Signed)
[facility] Emergency Department Provider Note  ____________________________________________   Event Date/Time   First MD Initiated Contact with Patient 12/22/20 [phone]     (approximate)  I have reviewed the triage vital signs and the nursing notes.   HISTORY  Chief Complaint Wrist Injury    HPI Anne Ferrell is a 87 y.o. female presents emergency department via EMS.  Patient had a fall at home.  Landed on her right side.  Is been unable to stand or sit since the fall.  Her family member is with her and states that she has had severe pain in the wrist and hand.  History of osteoporosis.  Denies blood thinners.  No LOC  Past Medical History:  Diagnosis Date   Arthritis    Diverticulitis    Glaucoma    Heart murmur    High blood pressure    Osteoporosis 10/05/2015   Vitamin D deficiency 09/18/2015    Patient Active Problem List   Diagnosis Date Noted   Closed right hip fracture ([place]) 12/22/2020   Fall at home, initial encounter 12/22/2020   Closed olecranon fracture, right, initial encounter 12/22/2020   Closed fracture distal radius and ulna, right, initial encounter 12/22/2020   Valvular heart disease 10/31/2017   Adjustment disorder with anxiety 09/16/2016   Osteoporosis 10/05/2015   Vitamin D deficiency 09/18/2015   Essential hypertension    Glaucoma    Diverticulitis     Past Surgical History:  Procedure Laterality Date   ABDOMINAL HYSTERECTOMY  2008   cataract surgery Bilateral 2011   TONSILLECTOMY AND ADENOIDECTOMY  1941    Prior to Admission medications   Medication Sig Start Date End Date Taking? Authorizing Provider  atenolol (TENORMIN) 25 MG tablet Take 1 tablet by mouth once daily 01/12/20  Yes Copland, Spencer, MD  Magnesium 250 MG TABS Take 2 tablets by mouth daily.   Yes [provider]  Multiple Vitamins-Minerals (PRESERVISION AREDS) CAPS Take 1 capsule by mouth daily.   Yes [provider]  VITAMIN A  PO Take 2 tablets by mouth daily. 10,000 units   Yes [provider]  vitamin B-12 (CYANOCOBALAMIN) 1000 MCG tablet Take 1,000 mcg by mouth daily.   Yes [provider]  Vitamin D, Cholecalciferol, 25 MCG (1000 UT) TABS Take 1 tablet by mouth daily.   Yes [provider]    Allergies Anise flavor, Ciprofloxacin, Levofloxacin, and Licorice flavor [flavoring agent]  Family History  Problem Relation Age of Onset   Cancer Father        prostate   Colon cancer Neg Hx     Social History Social History   Tobacco Use   Smoking status: Never   Smokeless tobacco: Never  Vaping Use   Vaping Use: Never used  Substance Use Topics   Alcohol use: Yes    Alcohol/week: 7.0 standard drinks    Types: 7 Glasses of wine per week   Drug use: No    Review of Systems  Constitutional: No fever/chills Eyes: No visual changes. ENT: No sore throat. Respiratory: Denies cough Cardiovascular: Denies chest pain Gastrointestinal: Denies abdominal pain Genitourinary: Negative for dysuria. Musculoskeletal: Negative for back pain.  Positive for right hip, right wrist pain Skin: Negative for rash. Psychiatric: no mood changes,     ____________________________________________   PHYSICAL EXAM:  VITAL SIGNS: ED Triage Vitals  Enc Vitals Group     BP 12/22/20 0942 (!) 160/78     Pulse Rate 12/22/20 0942 71  Resp 12/22/20 0942 16     Temp 12/22/20 0942 98.1 F (36.7 C)     Temp Source 12/22/20 0942 Oral     SpO2 12/22/20 0938 95 %     Weight 12/22/20 0941 130 lb (59 kg)     Height 12/22/20 0941 '5\' 9"'$  (1.753 m)     Head Circumference --      [place] --      Pain Score 12/22/20 0941 6     Pain Loc --      Pain Edu? --      Excl. in [place]? --     Constitutional: Alert and oriented. Well appearing and in no acute distress. Eyes: Conjunctivae are normal.  PERRL EOMI Head: Atraumatic. Nose: No congestion/rhinnorhea. Mouth/Throat: Mucous membranes are moist.    Neck:  supple no lymphadenopathy noted Cardiovascular: Normal rate, regular rhythm. Heart sounds are normal Respiratory: Normal respiratory effort.  No retractions, lungs c t a  Abd: soft nontender bs normal all 4 quad GU: deferred Musculoskeletal: Right wrist has deformity, neurovascular is intact, right hip is tender, right leg is shorter than the left on examination, right elbow has a large bruise noted neurologic:  Normal speech and language.  Skin:  Skin is warm, dry and intact. No rash noted. Psychiatric: Mood and affect are normal. Speech and behavior are normal.  ____________________________________________   LABS (all labs ordered are listed, but only abnormal results are displayed)  Labs Reviewed  COMPREHENSIVE METABOLIC PANEL - Abnormal; Notable for the following components:      Result Value   Glucose, Bld 130 (*)    BUN 25 (*)    Calcium 8.4 (*)    Total Protein 5.7 (*)    All other components within normal limits  CBC WITH DIFFERENTIAL/PLATELET - Abnormal; Notable for the following components:   WBC 17.8 (*)    Neutro Abs 16.3 (*)    Lymphs Abs 0.5 (*)    Abs Immature Granulocytes 0.13 (*)    All other components within normal limits  RESP PANEL BY RT-PCR (FLU A&B, COVID) ARPGX2  PROTIME-INR  APTT  TYPE AND SCREEN   ____________________________________________   ____________________________________________  RADIOLOGY  X-ray of the right wrist, right elbow and right hip CT of the head and C-spine  ____________________________________________   PROCEDURES  Procedure(s) performed:   .Ortho Injury Treatment  Date/Time: 12/22/2020 1:40 PM Performed by: Versie Starks, PA-C Authorized by: Versie Starks, PA-C   Consent:    Consent obtained:  Verbal   Consent given by:  Patient   Risks discussed:  Fracture, nerve damage, restricted joint movement, vascular damage and stiffnessInjury location: forearm Location details: right forearm Injury type:  fracture Fracture type: radial neck Pre-procedure neurovascular assessment: neurovascularly intact Pre-procedure distal perfusion: normal Pre-procedure neurological function: normal Pre-procedure range of motion: normal  Anesthesia: Local anesthesia used: no  Patient sedated: NoManipulation performed: no Immobilization: splint Splint type: sugar tong Splint Applied by: ED Tech Supplies used: cotton padding, elastic bandage and Ortho-Glass Post-procedure neurovascular assessment: post-procedure neurovascularly intact Post-procedure distal perfusion: normal Post-procedure neurological function: normal Post-procedure range of motion: normal Comments: Patient had olecranon and radial/ulna fractures      ____________________________________________   INITIAL IMPRESSION / [place] / ED COURSE  Pertinent labs & imaging results that were available during my care of the patient were reviewed by me and considered in my medical decision making (see chart for details).   The patient is a 87 year old female presents  emergency department via EMS.  Patient had a fall at home earlier today.  See HPI.  Physical exam shows patient is stable at this time.  DDx: Subdural, SAH, C-spine fracture, right wrist fracture, right hip fracture, right hip contusion  X-rays of the right hip and right wrist, CT of the head and C-spine Xray of right elbow  X-ray of the right hip shows a femoral neck fracture, x-ray of the right wrist shows distal radius and ulna fracture, x-ray of the right elbow shows an olecranon fracture, X-rays were reviewed by me and confirmed by radiology  CT of the head and C-spine did not show any new acute findings.  Lucencies noted on the C-spine.  Patient does not have a history of cancer and so do not feel like these are metastatic.  However did discuss the findings with the patient and her son.  They are to follow-up with orthopedics or her regular doctor concerning  this.  Consult orthopedics due to the hip fracture forearm fracture. Dr. Sabra Heck to admit we will do surgery in the morning, n.p.o. after midnight  Labs are reassuring, respiratory panel is negative, PT PTT are normal, CBC is elevated WBC of 17.8 which I would attribute to trauma at this time, comprehensive metabolic panel shows elevated glucose of 130, BUN is elevated but creatinine is normal, feel the patient is stable at this time  Consult to hospitalist.  Dr. Blaine Hamper to admit  Patient is stable at this time.  Care transferred to hospitalist.    Anne Ferrell was evaluated in Emergency Department on 12/22/2020 for the symptoms described in the history of present illness. She was evaluated in the context of the global COVID-19 pandemic, which necessitated consideration that the patient might be at risk for infection with the SARS-CoV-2 virus that causes COVID-19. Institutional protocols and algorithms that pertain to the evaluation of patients at risk for COVID-19 are in a state of rapid change based on information released by regulatory bodies including the CDC and federal and state organizations. These policies and algorithms were followed during the patient's care in the ED.    As part of my medical decision making, I reviewed the following data within the [facility] History obtained from family, Nursing notes reviewed and incorporated, Labs reviewed , Old chart reviewed, Radiograph reviewed , Discussed with admitting physician , A consult was requested and obtained from this/these consultant(s) Orthopedics, Notes from prior ED visits, and [place] Controlled Substance Database  ____________________________________________   FINAL CLINICAL IMPRESSION(S) / ED DIAGNOSES  Final diagnoses:  Fracture  Closed fracture of neck of right femur, initial encounter ([place])  Closed fracture of distal end of right radius, unspecified fracture morphology, initial encounter  Closed fracture of  distal end of right ulna, unspecified fracture morphology, initial encounter  Closed fracture of olecranon process of right ulna, initial encounter  Fall, initial encounter      NEW MEDICATIONS STARTED DURING THIS VISIT:  New Prescriptions   No medications on file     Note:  This document was prepared using Dragon voice recognition software and may include unintentional dictation errors.    Versie Starks, PA-C 12/22/20 1348    Lavonia Drafts, MD 12/22/20 [phone]

## 2020-12-22 NOTE — Plan of Care (Signed)

## 2020-12-22 NOTE — H&P (Signed)
History and Physical    Anne Ferrell [id number] DOB: 13-Apr-1933 DOA: 12/22/2020  Referring MD/NP/PA:   PCP: Owens Loffler, MD   Patient coming from:  The patient is coming from home.  At baseline, pt is independent for most of ADL.        Chief Complaint: fall  HPI: Anne Ferrell is a 87 y.o. female with medical history significant of hypertension, anxiety, diverticulitis, osteoporosis, valvular heart disease, who presents with fall.  Patient states that that she fell accidentally when she was walking to the bathroom and lost her balance at about 8:20 AM.  No loss of consciousness.  Patient developed pain in right hip, right elbow and right wrist.  The pain is constant, sharp, severe, nonradiating.  Patient does not have chest pain, cough, shortness breath.  No fever or chills.  Patient has nausea, no vomiting, diarrhea or abdominal pain.  No symptoms of UTI.  Patient states that she does not have history of coronary artery disease or CHF.  ED Course: pt was found to have WBC 17.8, negative COVID PCR, INR 1.1, PTT 28, electrolytes renal function okay, temperature normal, blood pressure 134/78, heart rate 77, RR 16, oxygen sat 92-96% on room air.  CT of head is negative for acute intracranial abnormalities.  CT of C-spine showed degenerative disc disease and C2 lesion which is nonspecific but most likely benign in the absence of a known primary malignancy per radiologist.  Patient is admitted to [facility] bed as inpatient.  Dr. Sabra Heck of [facility] is consulted.  X-ray showed: 1 displaced subcapital fracture right hip 2 displaced comminuted right olecranon fracture 3 displaced distal radius and ulna fracture  CT cervical spine:  1. No evidence of acute fracture or traumatic malalignment.  2. Multilevel moderate to severe degenerative change with likely at least mild to moderate canal stenosis at C6-C7 and bulky multilevel left facet hypertrophy. An MRI of the cervical spine could  further evaluate the canal and foramina if clinically indicated.  3. Discrete lucent lesion involving the posterior elements at C2 and small sclerotic lesion involving the posterior left second rib, nonspecific but most likely benign in the absence of a known primary malignancy.   Review of Systems:   General: no fevers, chills, no body weight gain, has fatigue HEENT: no blurry vision, hearing changes or sore throat Respiratory: no dyspnea, coughing, wheezing CV: no chest pain, no palpitations GI: no nausea, vomiting, abdominal pain, diarrhea, constipation GU: no dysuria, burning on urination, increased urinary frequency, hematuria  Ext: no leg edema Neuro: no unilateral weakness, numbness, or tingling, no vision change or hearing loss. Has fall. Skin: no rash, no skin tear. MSK: has pain in right hip, right elbow and right wrist Heme: No easy bruising.  Travel history: No recent long distant travel.  Allergy:  Allergies  Allergen Reactions   Anise Flavor Other (See Comments)    Feels like she is going to pass out   Ciprofloxacin    Levofloxacin    Licorice Flavor [Flavoring Agent] Palpitations    Past Medical History:  Diagnosis Date   Arthritis    Diverticulitis    Glaucoma    Heart murmur    High blood pressure    Osteoporosis 10/05/2015   Vitamin D deficiency 09/18/2015    Past Surgical History:  Procedure Laterality Date   ABDOMINAL HYSTERECTOMY  2008   cataract surgery Bilateral 2011   TONSILLECTOMY AND ADENOIDECTOMY  1941    Social History:  reports that she  has never smoked. She has never used smokeless tobacco. She reports current alcohol use of about 7.0 standard drinks per week. She reports that she does not use drugs.  Family History:  Family History  Problem Relation Age of Onset   Cancer Father        prostate   Colon cancer Neg Hx      Prior to Admission medications   Medication Sig Start Date End Date Taking? Authorizing Provider  atenolol  (TENORMIN) 25 MG tablet Take 1 tablet by mouth once daily 01/12/20   Copland, Frederico Hamman, MD  Magnesium 250 MG TABS Take 1 tablet by mouth daily.    [provider]  Multiple Vitamins-Minerals (PRESERVISION AREDS) CAPS Take 1 capsule by mouth daily.    [provider]  VITAMIN A PO Take 5,000 Units by mouth daily.    [provider]  vitamin B-12 (CYANOCOBALAMIN) 1000 MCG tablet Take 1,000 mcg by mouth daily.    [provider]  Vitamin D, Cholecalciferol, 25 MCG (1000 UT) TABS Take 1 tablet by mouth daily.    [provider]    Physical Exam: Vitals:   12/22/20 1000 12/22/20 1245 12/22/20 1540 12/22/20 1610  BP: (!) 146/75 134/78 132/89   Pulse: 72 77 74   Resp:  16 17   Temp:      TempSrc:      SpO2: 96% 92% 97% 97%  Weight:      Height:       General: Not in acute distress HEENT:       Eyes: PERRL, EOMI, no scleral icterus.       ENT: No discharge from the ears and nose, no pharynx injection, no tonsillar enlargement.        Neck: No JVD, no bruit, no mass felt. Heme: No neck lymph node enlargement. Cardiac: S1/S2, RRR, has soft murmur, No gallops or rubs. Respiratory: No rales, wheezing, rhonchi or rubs. GI: Soft, nondistended, nontender, no rebound pain, no organomegaly, BS present. GU: No hematuria Ext: No pitting leg edema bilaterally. 1+DP/PT pulse bilaterally. Musculoskeletal: has tenderness in right hip, right elbow and right wrist Skin: No rashes.  Neuro: Alert, oriented X3, cranial nerves II-XII grossly intact, moves all extremities normally. Psych: Patient is not psychotic, no suicidal or hemocidal ideation.  Labs on Admission: I have personally reviewed following labs and imaging studies  CBC: Recent Labs  Lab 12/22/20 1141  WBC 17.8*  NEUTROABS 16.3*  HGB 12.7  HCT 37.6  MCV 94.7  PLT [id number]   Basic Metabolic Panel: Recent Labs  Lab 12/22/20 1141  NA 137  K 4.0  CL 101  CO2 29  GLUCOSE 130*  BUN 25*   CREATININE 0.72  CALCIUM 8.4*   GFR: Estimated Creatinine Clearance: 47 mL/min (by C-G formula based on SCr of 0.72 mg/dL). Liver Function Tests: Recent Labs  Lab 12/22/20 1141  AST 30  ALT 27  ALKPHOS 69  BILITOT 1.1  PROT 5.7*  ALBUMIN 3.7   No results for input(s): LIPASE, AMYLASE in the last 168 hours. No results for input(s): AMMONIA in the last 168 hours. Coagulation Profile: Recent Labs  Lab 12/22/20 1141  INR 1.1   Cardiac Enzymes: No results for input(s): CKTOTAL, CKMB, CKMBINDEX, TROPONINI in the last 168 hours. BNP (last 3 results) No results for input(s): PROBNP in the last 8760 hours. HbA1C: No results for input(s): HGBA1C in the last 72 hours. CBG: No results for input(s): GLUCAP in the last 168 hours.  Lipid Profile: No results for input(s): CHOL, HDL, LDLCALC, TRIG, CHOLHDL, LDLDIRECT in the last 72 hours. Thyroid Function Tests: No results for input(s): TSH, T4TOTAL, FREET4, T3FREE, THYROIDAB in the last 72 hours. Anemia Panel: No results for input(s): VITAMINB12, FOLATE, FERRITIN, TIBC, IRON, RETICCTPCT in the last 72 hours. Urine analysis:    Component Value Date/Time   COLORURINE YELLOW (A) 02/24/2015 2123   APPEARANCEUR CLEAR (A) 02/24/2015 2123   LABSPEC 1.024 02/24/2015 2123   PHURINE 5.0 02/24/2015 2123   GLUCOSEU NEGATIVE 02/24/2015 2123   HGBUR NEGATIVE 02/24/2015 2123   BILIRUBINUR NEGATIVE 02/24/2015 2123   KETONESUR TRACE (A) 02/24/2015 2123   PROTEINUR NEGATIVE 02/24/2015 2123   NITRITE NEGATIVE 02/24/2015 2123   LEUKOCYTESUR NEGATIVE 02/24/2015 2123   Sepsis Labs: '@LABRCNTIP'$ (procalcitonin:4,lacticidven:4) ) Recent Results (from the past 240 hour(s))  Resp Panel by RT-PCR (Flu A&B, Covid) Nasopharyngeal Swab     Status: None   Collection Time: 12/22/20 11:42 AM   Specimen: Nasopharyngeal Swab; Nasopharyngeal(NP) swabs in vial transport medium  Result Value Ref Range Status   SARS Coronavirus 2 by RT PCR NEGATIVE NEGATIVE  Final    Comment: (NOTE) SARS-CoV-2 target nucleic acids are NOT DETECTED.  The SARS-CoV-2 RNA is generally detectable in upper respiratory specimens during the acute phase of infection. The lowest concentration of SARS-CoV-2 viral copies this assay can detect is 138 copies/mL. A negative result does not preclude SARS-Cov-2 infection and should not be used as the sole basis for treatment or other patient management decisions. A negative result may occur with  improper specimen collection/handling, submission of specimen other than nasopharyngeal swab, presence of viral mutation(s) within the areas targeted by this assay, and inadequate number of viral copies(<138 copies/mL). A negative result must be combined with clinical observations, patient history, and epidemiological information. The expected result is Negative.  Fact Sheet for Patients:  EntrepreneurPulse.com.au  Fact Sheet for Healthcare Providers:  IncredibleEmployment.be  This test is no t yet approved or cleared by the [place] FDA and  has been authorized for detection and/or diagnosis of SARS-CoV-2 by FDA under an Emergency Use Authorization (EUA). This EUA will remain  in effect (meaning this test can be used) for the duration of the COVID-19 declaration under Section 564(b)(1) of the Act, 21 U.S.C.section 360bbb-3(b)(1), unless the authorization is terminated  or revoked sooner.       Influenza A by PCR NEGATIVE NEGATIVE Final   Influenza B by PCR NEGATIVE NEGATIVE Final    Comment: (NOTE) The Xpert Xpress SARS-CoV-2/FLU/RSV plus assay is intended as an aid in the diagnosis of influenza from Nasopharyngeal swab specimens and should not be used as a sole basis for treatment. Nasal washings and aspirates are unacceptable for Xpert Xpress SARS-CoV-2/FLU/RSV testing.  Fact Sheet for Patients: EntrepreneurPulse.com.au  Fact Sheet for Healthcare  Providers: IncredibleEmployment.be  This test is not yet approved or cleared by the [place] FDA and has been authorized for detection and/or diagnosis of SARS-CoV-2 by FDA under an Emergency Use Authorization (EUA). This EUA will remain in effect (meaning this test can be used) for the duration of the COVID-19 declaration under Section 564(b)(1) of the Act, 21 U.S.C. section 360bbb-3(b)(1), unless the authorization is terminated or revoked.  Performed at [facility], [address] [place], [place] [address]      Radiological Exams on Admission: DG Elbow 2 Views Right  Result Date: 12/22/2020 CLINICAL DATA:  Fall with pain. EXAM: RIGHT ELBOW - 2 VIEW COMPARISON:  None. FINDINGS: Displaced  and comminuted fracture of the olecranon. Normal alignment of the radial head with the capitellum. Extensive soft tissue swelling in the right elbow with a joint effusion. IMPRESSION: Comminuted and displaced fracture of the olecranon. Electronically Signed   By: Markus Daft M.D.   On: 12/22/2020 13:10   DG Wrist Complete Right  Result Date: 12/22/2020 CLINICAL DATA:  Right wrist pain after fall. EXAM: RIGHT WRIST - COMPLETE 3+ VIEW COMPARISON:  None. FINDINGS: Moderately displaced and comminuted fractures are seen involving the distal right radius and ulna. Moderate osteoarthritis is seen involving the first carpometacarpal joint. IMPRESSION: Moderately displaced and comminuted distal right radial and ulnar fractures. Electronically Signed   By: Marijo Conception M.D.   On: 12/22/2020 11:02   CT HEAD WO CONTRAST (5MM)  Result Date: 12/22/2020 CLINICAL DATA:  Head trauma, minor (Age >= 65y); Neck trauma (Age >= 65y) EXAM: CT HEAD WITHOUT CONTRAST CT CERVICAL SPINE WITHOUT CONTRAST TECHNIQUE: Multidetector CT imaging of the head and cervical spine was performed following the standard protocol without intravenous contrast. Multiplanar CT image reconstructions of the cervical  spine were also generated. COMPARISON:  None. FINDINGS: CT HEAD FINDINGS Brain: No evidence of acute large vascular territory infarction, hemorrhage, hydrocephalus, extra-axial collection or mass lesion/mass effect. Mild to moderate patchy white matter hypoattenuation, likely related to chronic microvascular ischemic disease. Generalized mild to moderate atrophy with ex vacuo ventricular dilation. Streak artifact limits evaluation of the posterior fossa. Vascular: No hyperdense vessel identified. Calcific intracranial atherosclerosis. Skull: No acute fracture. Sinuses/Orbits: Clear visualized sinuses. No acute orbital findings. Other: No mastoid effusions. CT CERVICAL SPINE FINDINGS Alignment: Straightening of the normal cervical lordosis. Mild anterolisthesis of C5 on C6, likely degenerative given degenerative changes at this level. Mild dextrocurvature. Skull base and vertebrae: No evidence of acute fracture. Nonspecific discrete lucent lesion involving the posterior elements at C2. Soft tissues and spinal canal: No prevertebral fluid or swelling. No visible canal hematoma. Disc levels: Craniocervical degenerative change. Multilevel degenerative disc disease, greatest and moderate to severe on the left at C6-C7 where there is disc height loss, endplate sclerosis and endplate spurring. Likely at least mild to moderate canal stenosis at this level. Multilevel bulky left facet arthropathy, greatest and severe on the left at C5-C6 where there is osseous fusion across the facet joint. Ossified posterior ligamentum flavum on the left at T1-T2. Upper chest: Biapical pleuroparenchymal scarring. Otherwise, visualized lung apices are clear. The thyroid is partially imaged with multiple subcentimeter thyroid nodules. Not clinically significant; no follow-up imaging recommended (ref: J Am Coll Radiol. 2015 Feb;12(2): 143-50). Other: Nonspecific small sclerotic lesion in the posterior left rib. IMPRESSION: CT head: 1. No  evidence of acute intracranial abnormality. 2. Mild to moderate chronic microvascular ischemic disease and atrophy. CT cervical spine: 1. No evidence of acute fracture or traumatic malalignment. 2. Multilevel moderate to severe degenerative change with likely at least mild to moderate canal stenosis at C6-C7 and bulky multilevel left facet hypertrophy. An MRI of the cervical spine could further evaluate the canal and foramina if clinically indicated. 3. Discrete lucent lesion involving the posterior elements at C2 and small sclerotic lesion involving the posterior left second rib, nonspecific but most likely benign in the absence of a known primary malignancy. Electronically Signed   By: Margaretha Sheffield M.D.   On: 12/22/2020 11:24   CT Cervical Spine Wo Contrast  Result Date: 12/22/2020 CLINICAL DATA:  Head trauma, minor (Age >= 65y); Neck trauma (Age >= 65y) EXAM: CT HEAD WITHOUT  CONTRAST CT CERVICAL SPINE WITHOUT CONTRAST TECHNIQUE: Multidetector CT imaging of the head and cervical spine was performed following the standard protocol without intravenous contrast. Multiplanar CT image reconstructions of the cervical spine were also generated. COMPARISON:  None. FINDINGS: CT HEAD FINDINGS Brain: No evidence of acute large vascular territory infarction, hemorrhage, hydrocephalus, extra-axial collection or mass lesion/mass effect. Mild to moderate patchy white matter hypoattenuation, likely related to chronic microvascular ischemic disease. Generalized mild to moderate atrophy with ex vacuo ventricular dilation. Streak artifact limits evaluation of the posterior fossa. Vascular: No hyperdense vessel identified. Calcific intracranial atherosclerosis. Skull: No acute fracture. Sinuses/Orbits: Clear visualized sinuses. No acute orbital findings. Other: No mastoid effusions. CT CERVICAL SPINE FINDINGS Alignment: Straightening of the normal cervical lordosis. Mild anterolisthesis of C5 on C6, likely degenerative given  degenerative changes at this level. Mild dextrocurvature. Skull base and vertebrae: No evidence of acute fracture. Nonspecific discrete lucent lesion involving the posterior elements at C2. Soft tissues and spinal canal: No prevertebral fluid or swelling. No visible canal hematoma. Disc levels: Craniocervical degenerative change. Multilevel degenerative disc disease, greatest and moderate to severe on the left at C6-C7 where there is disc height loss, endplate sclerosis and endplate spurring. Likely at least mild to moderate canal stenosis at this level. Multilevel bulky left facet arthropathy, greatest and severe on the left at C5-C6 where there is osseous fusion across the facet joint. Ossified posterior ligamentum flavum on the left at T1-T2. Upper chest: Biapical pleuroparenchymal scarring. Otherwise, visualized lung apices are clear. The thyroid is partially imaged with multiple subcentimeter thyroid nodules. Not clinically significant; no follow-up imaging recommended (ref: J Am Coll Radiol. 2015 Feb;12(2): 143-50). Other: Nonspecific small sclerotic lesion in the posterior left rib. IMPRESSION: CT head: 1. No evidence of acute intracranial abnormality. 2. Mild to moderate chronic microvascular ischemic disease and atrophy. CT cervical spine: 1. No evidence of acute fracture or traumatic malalignment. 2. Multilevel moderate to severe degenerative change with likely at least mild to moderate canal stenosis at C6-C7 and bulky multilevel left facet hypertrophy. An MRI of the cervical spine could further evaluate the canal and foramina if clinically indicated. 3. Discrete lucent lesion involving the posterior elements at C2 and small sclerotic lesion involving the posterior left second rib, nonspecific but most likely benign in the absence of a known primary malignancy. Electronically Signed   By: Margaretha Sheffield M.D.   On: 12/22/2020 11:24   DG Chest Portable 1 View  Result Date: 12/22/2020 CLINICAL DATA:   Hip fracture.  Preop. EXAM: PORTABLE CHEST 1 VIEW COMPARISON:  02/16/2015 FINDINGS: Chronic coarse lung markings. Few densities at the left lung base are suggestive for atelectasis. Otherwise, the lungs are clear. Negative for a pneumothorax. Heart and mediastinum are within normal limits. IMPRESSION: Probable left basilar atelectasis. Otherwise, no acute chest findings. Electronically Signed   By: Markus Daft M.D.   On: 12/22/2020 13:07   DG HIP UNILAT WITH PELVIS 1V RIGHT  Result Date: 12/22/2020 CLINICAL DATA:  Fall, fracture EXAM: DG HIP (WITH OR WITHOUT PELVIS) 1V RIGHT COMPARISON:  Same day hip radiograph FINDINGS: Single cross-table lateral view of the right hip demonstrates a mildly displaced subcapital femoral neck fracture. IMPRESSION: Mildly displaced subcapital femoral neck fracture. Electronically Signed   By: Maurine Simmering M.D.   On: 12/22/2020 13:11   DG Hip Unilat W or Wo Pelvis 2-3 Views Right  Result Date: 12/22/2020 CLINICAL DATA:  Right hip pain after fall. EXAM: DG HIP (WITH OR WITHOUT PELVIS) 2-3V  RIGHT COMPARISON:  None. FINDINGS: Moderately displaced proximal right femoral neck fracture is noted. IMPRESSION: Moderately displaced proximal right femoral neck fracture. Electronically Signed   By: Marijo Conception M.D.   On: 12/22/2020 11:00     EKG: Not done in ED, will get one.   Assessment/Plan Principal Problem:   Closed right hip fracture [facility]) Active Problems:   Essential hypertension   Fall at home, initial encounter   Closed olecranon fracture, right, initial encounter   Closed fracture distal radius and ulna, right, initial encounter   Closed right hip fracture, closed olecranon fracture and closed fracture distal radius and ulna, right, initial encounter:  As evidenced by x-ray. Patient has moderate pain now. No neurovascular compromise. Orthopedic surgeon, Dr. Sabra Heck was consulted.   - will admit to Med-surg bed - Pain control: morphine prn and percocet - When  necessary Zofran for nausea - Robaxin for muscle spasm - Appreciated Dr. consultation - type and cross - INR/PTT - PT/OT when able to (not ordered now)  Leukocytosis: WBC 17.8. Likely due to stress-induced demargination. Patient does not have signs of infection. -Follow-up CBC  Essential hypertension -As needed hydralazine IV -Atenolol  Fall at home, initial encounter: CT of her head and CT of C-spine is negative for acute issues. -PT/OT when able to  Perioperative Cardiac Risk: pt has hx of HTN and valvular heart disease, never had heart attack or CHF.  Currently patient does not have chest pain or shortness of breath.  Patient is currently taking atenolol for hypertension. Currently patient is active and independent of ADLs and, IADLs. At this time point, no further work up is needed. Patient's GUPTA score perioperative myocardial infarction or cardaic arrest is 0.24 % which is low risk. I discussed the risk with patient and her son, pt would like to proceed for surgery.       DVT ppx: SCD Code Status: Full code Family Communication:  Yes, patient's son  at bed side Disposition Plan:  Anticipate discharge back to previous environment Consults called:  Dr. Sabra Heck of [facility] Admission status and Level of care: Med-Surg:    Med-surg bed as inpt      Status is: Inpatient  Remains inpatient appropriate because:Inpatient level of care appropriate due to severity of illness  Dispo: The patient is from: Home              Anticipated d/c is to:  to be determined              Patient currently is not medically stable to d/c.   Difficult to place patient No           Date of Service 12/22/2020    Ivor Costa [facility]   If 7PM-7AM, please contact night-coverage [email or web address] 12/22/2020, 6:43 PM

## 2020-12-23 ENCOUNTER — Inpatient Hospital Stay: Payer: Medicare Other | Admitting: Anesthesiology

## 2020-12-23 ENCOUNTER — Inpatient Hospital Stay: Payer: Medicare Other

## 2020-12-23 ENCOUNTER — Encounter
Admission: EM | Disposition: A | Discharge status: Skilled Nursing Facility | Payer: Self-pay | Source: Home / Self Care | Attending: Internal Medicine

## 2020-12-23 HISTORY — PX: CLOSED REDUCTION WRIST FRACTURE: SHX1091

## 2020-12-23 HISTORY — PX: HIP ARTHROPLASTY: SHX981

## 2020-12-23 HISTORY — PX: ORIF ELBOW FRACTURE: SHX5031

## 2020-12-23 LAB — CBC
HCT: 35.7 % — ABNORMAL LOW (ref 36.0–46.0)
HCT: 31.1 % — ABNORMAL LOW (ref 36.0–46.0)
Hemoglobin: 12.2 g/dL (ref 12.0–15.0)
Hemoglobin: 10.7 g/dL — ABNORMAL LOW (ref 12.0–15.0)
MCH: 32.1 pg (ref 26.0–34.0)
MCH: 32.6 pg (ref 26.0–34.0)
MCHC: 34.2 g/dL (ref 30.0–36.0)
MCHC: 34.4 g/dL (ref 30.0–36.0)
MCV: 93.9 fL (ref 80.0–100.0)
MCV: 94.8 fL (ref 80.0–100.0)
Platelets: 174 10*3/uL (ref 150–400)
Platelets: 157 10*3/uL (ref 150–400)
RBC: 3.8 MIL/uL — ABNORMAL LOW (ref 3.87–5.11)
RBC: 3.28 MIL/uL — ABNORMAL LOW (ref 3.87–5.11)
RDW: 12.8 % (ref 11.5–15.5)
RDW: 13 % (ref 11.5–15.5)
WBC: 10.7 10*3/uL — ABNORMAL HIGH (ref 4.0–10.5)
WBC: 11.8 10*3/uL — ABNORMAL HIGH (ref 4.0–10.5)
nRBC: 0 % (ref 0.0–0.2)
nRBC: 0 % (ref 0.0–0.2)

## 2020-12-23 LAB — CREATININE, SERUM
Creatinine, Ser: 0.82 mg/dL (ref 0.44–1.00)
GFR, Estimated: >60 mL/min (ref >60)

## 2020-12-23 SURGERY — HEMIARTHROPLASTY, HIP, DIRECT ANTERIOR APPROACH, FOR FRACTURE
Anesthesia: General | Site: Wrist | Laterality: Right

## 2020-12-23 MED ORDER — FERROUS SULFATE 325 (65 FE) MG PO TABS
325.0000 mg | ORAL_TABLET | Freq: Every day | ORAL | Status: DC
  Administered 2020-12-24 – 2020-12-27 (×4): 325 mg via ORAL
  Filled 2020-12-23 (×4): qty 1

## 2020-12-23 MED ORDER — SODIUM CHLORIDE 0.9 % IR SOLN
Status: DC | PRN
  Administered 2020-12-23: 1000 mL

## 2020-12-23 MED ORDER — ROCURONIUM BROMIDE 100 MG/10ML IV SOLN
INTRAVENOUS | Status: DC | PRN
  Administered 2020-12-23: 30 mg via INTRAVENOUS
  Administered 2020-12-23: 10 mg via INTRAVENOUS
  Administered 2020-12-23: 40 mg via INTRAVENOUS
  Administered 2020-12-23: 20 mg via INTRAVENOUS

## 2020-12-23 MED ORDER — CALCIUM CARBONATE 1250 (500 CA) MG PO TABS
1.0000 | ORAL_TABLET | Freq: Every day | ORAL | Status: DC
  Administered 2020-12-24 – 2020-12-27 (×4): 500 mg via ORAL
  Filled 2020-12-23 (×6): qty 1

## 2020-12-23 MED ORDER — ZOLPIDEM TARTRATE 5 MG PO TABS: 5.0000 mg | ORAL_TABLET | Freq: Every evening | ORAL | Status: DC | PRN

## 2020-12-23 MED ORDER — ONDANSETRON HCL 4 MG/2ML IJ SOLN: 4.0000 mg | Freq: Four times a day (QID) | INTRAMUSCULAR | Status: DC | PRN

## 2020-12-23 MED ORDER — LACTATED RINGERS IV SOLN: INTRAVENOUS | Status: DC | PRN

## 2020-12-23 MED ORDER — PHENOL 1.4 % MT LIQD
1.0000 | OROMUCOSAL | Status: DC | PRN
  Filled 2020-12-23: qty 177

## 2020-12-23 MED ORDER — FENTANYL CITRATE (PF) 100 MCG/2ML IJ SOLN
INTRAMUSCULAR | Status: DC | PRN
  Administered 2020-12-23 (×3): 50 ug via INTRAVENOUS
  Administered 2020-12-23: 100 ug via INTRAVENOUS

## 2020-12-23 MED ORDER — TRANEXAMIC ACID-NACL 1000-0.7 MG/100ML-% IV SOLN
1000.0000 mg | Freq: Once | INTRAVENOUS | Status: AC
  Administered 2020-12-23: 1000 mg via INTRAVENOUS
  Filled 2020-12-23: qty 100

## 2020-12-23 MED ORDER — DOCUSATE SODIUM 100 MG PO CAPS
100.0000 mg | ORAL_CAPSULE | Freq: Two times a day (BID) | ORAL | Status: DC
  Administered 2020-12-23 – 2020-12-27 (×6): 100 mg via ORAL
  Filled 2020-12-23 (×6): qty 1

## 2020-12-23 MED ORDER — METOCLOPRAMIDE HCL 10 MG PO TABS: 5.0000 mg | ORAL_TABLET | Freq: Three times a day (TID) | ORAL | Status: DC | PRN

## 2020-12-23 MED ORDER — METHOCARBAMOL 1000 MG/10ML IJ SOLN
500.0000 mg | Freq: Four times a day (QID) | INTRAVENOUS | Status: DC | PRN
  Filled 2020-12-23: qty 5

## 2020-12-23 MED ORDER — CLINDAMYCIN PHOSPHATE 600 MG/50ML IV SOLN
600.0000 mg | Freq: Three times a day (TID) | INTRAVENOUS | Status: AC
Start: 2020-12-23 — End: 2020-12-24
  Administered 2020-12-23 – 2020-12-24 (×3): 600 mg via INTRAVENOUS
  Filled 2020-12-23 (×3): qty 50

## 2020-12-23 MED ORDER — DEXAMETHASONE SODIUM PHOSPHATE 10 MG/ML IJ SOLN
INTRAMUSCULAR | Status: AC
  Filled 2020-12-23: qty 1

## 2020-12-23 MED ORDER — CEFAZOLIN SODIUM-DEXTROSE 2-4 GM/100ML-% IV SOLN
2.0000 g | Freq: Three times a day (TID) | INTRAVENOUS | Status: AC
  Administered 2020-12-24 (×3): 2 g via INTRAVENOUS
  Filled 2020-12-23 (×3): qty 100

## 2020-12-23 MED ORDER — BUPIVACAINE-EPINEPHRINE 0.5% -1:200000 IJ SOLN
INTRAMUSCULAR | Status: DC | PRN
  Administered 2020-12-23: 30 mL

## 2020-12-23 MED ORDER — ENOXAPARIN SODIUM 30 MG/0.3ML IJ SOSY
30.0000 mg | PREFILLED_SYRINGE | INTRAMUSCULAR | Status: DC
  Administered 2020-12-24 – 2020-12-25 (×2): 30 mg via SUBCUTANEOUS
  Filled 2020-12-23 (×2): qty 0.3

## 2020-12-23 MED ORDER — ONDANSETRON HCL 4 MG/2ML IJ SOLN
INTRAMUSCULAR | Status: DC | PRN
  Administered 2020-12-23: 4 mg via INTRAVENOUS

## 2020-12-23 MED ORDER — EPHEDRINE 5 MG/ML INJ
INTRAVENOUS | Status: AC
  Filled 2020-12-23: qty 5

## 2020-12-23 MED ORDER — PROPOFOL 10 MG/ML IV BOLUS
INTRAVENOUS | Status: DC | PRN
  Administered 2020-12-23: 60 mg via INTRAVENOUS

## 2020-12-23 MED ORDER — MENTHOL 3 MG MT LOZG
1.0000 | LOZENGE | OROMUCOSAL | Status: DC | PRN
  Filled 2020-12-23: qty 9

## 2020-12-23 MED ORDER — ACETAMINOPHEN 10 MG/ML IV SOLN
INTRAVENOUS | Status: DC | PRN
  Administered 2020-12-23: 1000 mg via INTRAVENOUS

## 2020-12-23 MED ORDER — OXYCODONE HCL 5 MG/5ML PO SOLN
5.0000 mg | Freq: Once | ORAL | Status: DC | PRN
Start: 2020-12-23 — End: 2020-12-23

## 2020-12-23 MED ORDER — FENTANYL CITRATE (PF) 100 MCG/2ML IJ SOLN
INTRAMUSCULAR | Status: AC
  Filled 2020-12-23: qty 2

## 2020-12-23 MED ORDER — SODIUM CHLORIDE 0.45 % IV SOLN: INTRAVENOUS | Status: DC

## 2020-12-23 MED ORDER — BUPIVACAINE HCL (PF) 0.25 % IJ SOLN
INTRAMUSCULAR | Status: AC
  Filled 2020-12-23: qty 30

## 2020-12-23 MED ORDER — ACETAMINOPHEN 10 MG/ML IV SOLN
INTRAVENOUS | Status: AC
  Filled 2020-12-23: qty 100

## 2020-12-23 MED ORDER — METOCLOPRAMIDE HCL 5 MG/ML IJ SOLN: 5.0000 mg | Freq: Three times a day (TID) | INTRAMUSCULAR | Status: DC | PRN

## 2020-12-23 MED ORDER — ACETAMINOPHEN 10 MG/ML IV SOLN: 1000.0000 mg | Freq: Once | INTRAVENOUS | Status: DC | PRN

## 2020-12-23 MED ORDER — ONDANSETRON HCL 4 MG/2ML IJ SOLN: 4.0000 mg | Freq: Once | INTRAMUSCULAR | Status: DC | PRN

## 2020-12-23 MED ORDER — EPHEDRINE SULFATE 50 MG/ML IJ SOLN
INTRAMUSCULAR | Status: DC | PRN
  Administered 2020-12-23 (×2): 10 mg via INTRAVENOUS
  Administered 2020-12-23: 5 mg via INTRAVENOUS

## 2020-12-23 MED ORDER — ONDANSETRON HCL 4 MG PO TABS: 4.0000 mg | ORAL_TABLET | Freq: Four times a day (QID) | ORAL | Status: DC | PRN

## 2020-12-23 MED ORDER — SODIUM CHLORIDE 0.9 % IV SOLN
INTRAVENOUS | Status: DC | PRN
  Administered 2020-12-23: 30 ug/min via INTRAVENOUS

## 2020-12-23 MED ORDER — ONDANSETRON HCL 4 MG/2ML IJ SOLN
INTRAMUSCULAR | Status: AC
  Filled 2020-12-23: qty 2

## 2020-12-23 MED ORDER — 0.9 % SODIUM CHLORIDE (POUR BTL) OPTIME
TOPICAL | Status: DC | PRN
  Administered 2020-12-23: 1000 mL

## 2020-12-23 MED ORDER — NEOMYCIN-POLYMYXIN B GU 40-200000 IR SOLN
Status: DC | PRN
  Administered 2020-12-23: 8 mL

## 2020-12-23 MED ORDER — SUGAMMADEX SODIUM 200 MG/2ML IV SOLN
INTRAVENOUS | Status: DC | PRN
  Administered 2020-12-23: 120 mg via INTRAVENOUS

## 2020-12-23 MED ORDER — METHOCARBAMOL 500 MG PO TABS: 500.0000 mg | ORAL_TABLET | Freq: Four times a day (QID) | ORAL | Status: DC | PRN

## 2020-12-23 MED ORDER — LIDOCAINE HCL (PF) 2 % IJ SOLN
INTRAMUSCULAR | Status: AC
  Filled 2020-12-23: qty 5

## 2020-12-23 MED ORDER — FENTANYL CITRATE (PF) 250 MCG/5ML IJ SOLN
INTRAMUSCULAR | Status: AC
  Filled 2020-12-23: qty 5

## 2020-12-23 MED ORDER — ALUM & MAG HYDROXIDE-SIMETH 200-200-20 MG/5ML PO SUSP: 30.0000 mL | ORAL | Status: DC | PRN

## 2020-12-23 MED ORDER — FENTANYL CITRATE (PF) 100 MCG/2ML IJ SOLN: 25.0000 ug | INTRAMUSCULAR | Status: DC | PRN

## 2020-12-23 MED ORDER — PHENYLEPHRINE HCL (PRESSORS) 10 MG/ML IV SOLN
INTRAVENOUS | Status: DC | PRN
  Administered 2020-12-23: 100 ug via INTRAVENOUS
  Administered 2020-12-23: 200 ug via INTRAVENOUS
  Administered 2020-12-23 (×3): 100 ug via INTRAVENOUS

## 2020-12-23 MED ORDER — PROPOFOL 10 MG/ML IV BOLUS
INTRAVENOUS | Status: AC
  Filled 2020-12-23: qty 20

## 2020-12-23 MED ORDER — LIDOCAINE HCL (CARDIAC) PF 100 MG/5ML IV SOSY
PREFILLED_SYRINGE | INTRAVENOUS | Status: DC | PRN
  Administered 2020-12-23: 60 mg via INTRAVENOUS

## 2020-12-23 MED ORDER — CHLORHEXIDINE GLUCONATE CLOTH 2 % EX PADS
6.0000 | MEDICATED_PAD | Freq: Every day | CUTANEOUS | Status: DC
  Administered 2020-12-24 – 2020-12-26 (×2): 6 via TOPICAL

## 2020-12-23 MED ORDER — BUPIVACAINE HCL (PF) 0.25 % IJ SOLN
INTRAMUSCULAR | Status: DC | PRN
  Administered 2020-12-23: 18 mL

## 2020-12-23 MED ORDER — OXYCODONE HCL 5 MG PO TABS
5.0000 mg | ORAL_TABLET | Freq: Once | ORAL | Status: DC | PRN
Start: 2020-12-23 — End: 2020-12-23

## 2020-12-23 MED ORDER — ROCURONIUM BROMIDE 10 MG/ML (PF) SYRINGE
PREFILLED_SYRINGE | INTRAVENOUS | Status: AC
  Filled 2020-12-23: qty 10

## 2020-12-23 MED ORDER — BISACODYL 10 MG RE SUPP
10.0000 mg | Freq: Every day | RECTAL | Status: DC | PRN
  Administered 2020-12-25: 10 mg via RECTAL
  Filled 2020-12-23: qty 1

## 2020-12-23 MED ORDER — FLEET ENEMA 7-19 GM/118ML RE ENEM: 1.0000 | ENEMA | Freq: Once | RECTAL | Status: DC | PRN

## 2020-12-23 MED ORDER — DEXAMETHASONE SODIUM PHOSPHATE 10 MG/ML IJ SOLN
INTRAMUSCULAR | Status: DC | PRN
  Administered 2020-12-23: 6 mg via INTRAVENOUS

## 2020-12-23 SURGICAL SUPPLY — 93 items
BIT DRILL 2.5X110 QC LCP DISP (BIT) ×1 IMPLANT
BLADE DEBAKEY 8.0 (BLADE) ×4 IMPLANT
BLADE SAGITTAL WIDE XTHICK NO (BLADE) ×4 IMPLANT
BLADE SURG 15 STRL LF DISP TIS (BLADE) ×3 IMPLANT
BLADE SURG 15 STRL SS (BLADE) ×1
BLADE SURG MINI STRL (BLADE) ×4 IMPLANT
BLADE SURG SZ10 CARB STEEL (BLADE) ×4 IMPLANT
BLADE SURG SZ11 CARB STEEL (BLADE) ×4 IMPLANT
BNDG ADH 1X3 SHEER STRL LF (GAUZE/BANDAGES/DRESSINGS) ×1 IMPLANT
BNDG COHESIVE 4X5 TAN ST LF (GAUZE/BANDAGES/DRESSINGS) ×4 IMPLANT
BNDG ESMARK 4X12 TAN STRL LF (GAUZE/BANDAGES/DRESSINGS) ×4 IMPLANT
CHLORAPREP W/TINT 26 (MISCELLANEOUS) ×8 IMPLANT
COVER BACK TABLE REUSABLE LG (DRAPES) ×4 IMPLANT
CUFF TOURN SGL QUICK 18 (TOURNIQUET CUFF) ×1 IMPLANT
CUFF TOURN SGL QUICK 18X4 (TOURNIQUET CUFF) IMPLANT
DRAPE C-ARM XRAY 36X54 (DRAPES) ×4 IMPLANT
DRAPE FLUOR MINI C-ARM 54X84 (DRAPES) ×5 IMPLANT
DRAPE INCISE IOBAN 66X60 STRL (DRAPES) ×8 IMPLANT
DRSG AQUACEL AG ADV 3.5X10 (GAUZE/BANDAGES/DRESSINGS) ×4 IMPLANT
DRSG AQUACEL AG ADV 3.5X14 (GAUZE/BANDAGES/DRESSINGS) ×4 IMPLANT
DRSG MEPILEX SACRM 8.7X9.8 (GAUZE/BANDAGES/DRESSINGS) ×1 IMPLANT
ELECT BLADE 6.5 EXT (BLADE) ×4 IMPLANT
ELECT CAUTERY BLADE 6.4 (BLADE) ×4 IMPLANT
ELECT REM PT RETURN 9FT ADLT (ELECTROSURGICAL) ×4
ELECTRODE REM PT RTRN 9FT ADLT (ELECTROSURGICAL) ×3 IMPLANT
GAUZE 4X4 16PLY ~~LOC~~+RFID DBL (SPONGE) ×4 IMPLANT
GAUZE SPONGE 4X4 12PLY STRL (GAUZE/BANDAGES/DRESSINGS) ×8 IMPLANT
GAUZE XEROFORM 1X8 LF (GAUZE/BANDAGES/DRESSINGS) ×8 IMPLANT
GLOVE SURG ENC MOIS LTX SZ8 (GLOVE) ×4 IMPLANT
GLOVE SURG ORTHO LTX SZ8 (GLOVE) ×4 IMPLANT
GLOVE SURG ORTHO LTX SZ8.5 (GLOVE) ×4 IMPLANT
GLOVE SURG UNDER LTX SZ8 (GLOVE) ×4 IMPLANT
GOWN STRL REUS W/ TWL LRG LVL3 (GOWN DISPOSABLE) ×6 IMPLANT
GOWN STRL REUS W/TWL LRG LVL3 (GOWN DISPOSABLE) ×2
GOWN STRL REUS W/TWL LRG LVL4 (GOWN DISPOSABLE) ×4 IMPLANT
HANDLE YANKAUER SUCT BULB TIP (MISCELLANEOUS) ×4 IMPLANT
HEAD MODULAR ENDO (Orthopedic Implant) ×1 IMPLANT
HEAD UNPLR 51XMDLR STRL HIP (Orthopedic Implant) IMPLANT
HEMOVAC 400CC 10FR (MISCELLANEOUS) ×4 IMPLANT
IV NS 1000ML (IV SOLUTION) ×1
IV NS 1000ML BAXH (IV SOLUTION) ×3 IMPLANT
K-WIRE 2.0X150M (WIRE) ×4
KIT TURNOVER KIT A (KITS) ×4 IMPLANT
KWIRE 2.0X150M (WIRE) IMPLANT
LOOP VESSEL SUPERMAXI WHITE (MISCELLANEOUS) ×4 IMPLANT
MANIFOLD NEPTUNE II (INSTRUMENTS) ×4 IMPLANT
NDL FILTER BLUNT 18X1 1/2 (NEEDLE) ×3 IMPLANT
NDL MAYO CATGUT SZ4 TPR NDL (NEEDLE) ×3 IMPLANT
NDL SPNL 18GX3.5 QUINCKE PK (NEEDLE) ×6 IMPLANT
NDL SPNL 22GX3.5 QUINCKE BK (NEEDLE) IMPLANT
NEEDLE FILTER BLUNT 18X 1/2SAF (NEEDLE) ×1
NEEDLE FILTER BLUNT 18X1 1/2 (NEEDLE) ×3 IMPLANT
NEEDLE MAYO CATGUT SZ4 (NEEDLE) ×4 IMPLANT
NEEDLE SPNL 18GX3.5 QUINCKE PK (NEEDLE) ×8 IMPLANT
NEEDLE SPNL 22GX3.5 QUINCKE BK (NEEDLE) ×4 IMPLANT
NS IRRIG 1000ML POUR BTL (IV SOLUTION) ×4 IMPLANT
PACK EXTREMITY ARMC (MISCELLANEOUS) ×4 IMPLANT
PACK HIP PROSTHESIS (MISCELLANEOUS) ×4 IMPLANT
PAD ABD DERMACEA PRESS 5X9 (GAUZE/BANDAGES/DRESSINGS) ×1 IMPLANT
PAD CAST CTTN 4X4 STRL (SOFTGOODS) ×3 IMPLANT
PADDING CAST 4IN STRL (MISCELLANEOUS) ×2
PADDING CAST BLEND 4X4 STRL (MISCELLANEOUS) ×6 IMPLANT
PADDING CAST COTTON 4X4 STRL (SOFTGOODS) ×1
PULSAVAC PLUS IRRIG FAN TIP (DISPOSABLE) ×8
SLEEVE UNITRAX V40 (Orthopedic Implant) ×1 IMPLANT
SLEEVE UNITRAX V40 +8 (Orthopedic Implant) IMPLANT
SLING ARM LRG DEEP (SOFTGOODS) ×4 IMPLANT
SLING ARM M TX990204 (SOFTGOODS) ×4 IMPLANT
SOL PREP PVP 2OZ (MISCELLANEOUS) ×4
SOLUTION PREP PVP 2OZ (MISCELLANEOUS) ×3 IMPLANT
SPONGE T-LAP 18X18 ~~LOC~~+RFID (SPONGE) ×16 IMPLANT
STAPLER SKIN PROX 35W (STAPLE) ×5 IMPLANT
STEM HIP 127 DEG (Stem) ×1 IMPLANT
STOCKINETTE BIAS CUT 4 980044 (GAUZE/BANDAGES/DRESSINGS) ×4 IMPLANT
STOCKINETTE IMPERVIOUS 9X36 MD (GAUZE/BANDAGES/DRESSINGS) ×4 IMPLANT
STOCKINETTE STRL 3IN 960336 (SOFTGOODS) ×1 IMPLANT
SUT DVC 2 QUILL PDO  T11 36X36 (SUTURE) ×2
SUT DVC 2 QUILL PDO T11 36X36 (SUTURE) ×6 IMPLANT
SUT QUILL PDO 0 36 36 VIOLET (SUTURE) ×4 IMPLANT
SUT TICRON 2-0 30IN 311381 (SUTURE) ×9 IMPLANT
SUT VIC AB 2-0 CT1 36 (SUTURE) ×4 IMPLANT
SUT VICRYL 3-0 27IN (SUTURE) ×8 IMPLANT
SYR 10ML LL (SYRINGE) ×4 IMPLANT
SYR 30ML LL (SYRINGE) ×4 IMPLANT
SYR 50ML LL SCALE MARK (SYRINGE) ×4 IMPLANT
TAPE MICROFOAM 4IN (TAPE) ×4 IMPLANT
TIP FAN IRRIG PULSAVAC PLUS (DISPOSABLE) ×3 IMPLANT
TUBE SUCT KAM VAC (TUBING) ×4 IMPLANT
TUBING CONNECTING 10 (TUBING) ×1 IMPLANT
WATER STERILE IRR 1000ML POUR (IV SOLUTION) ×4 IMPLANT
WATER STERILE IRR 500ML POUR (IV SOLUTION) ×4 IMPLANT
WIRE K 0.045X5.O (WIRE) ×2 IMPLANT
WIRE Z .062 C-WIRE SPADE TIP (WIRE) ×2 IMPLANT

## 2020-12-23 NOTE — Anesthesia Postprocedure Evaluation (Signed)
Anesthesia Post Note  Patient: Anne Ferrell  Procedure(s) Performed: ARTHROPLASTY BIPOLAR HIP (HEMIARTHROPLASTY) (Right: Hip) OPEN REDUCTION INTERNAL FIXATION (ORIF) ELBOW/OLECRANON FRACTURE (Right: Elbow) CLOSED REDUCTION AND PERCUTANEOUS PINNING RIGHT WRIST (Right: Wrist)  Patient location during evaluation: PACU Anesthesia Type: General Level of consciousness: awake and alert Pain management: pain level controlled Vital Signs Assessment: post-procedure vital signs reviewed and stable Respiratory status: spontaneous breathing, nonlabored ventilation, respiratory function stable and patient connected to nasal cannula oxygen Cardiovascular status: blood pressure returned to baseline and stable Postop Assessment: no apparent nausea or vomiting Anesthetic complications: no   No notable events documented.   Last Vitals:  Vitals:   12/23/20 1245 12/23/20 1300  BP: (!) 84/53 (!) 102/54  Pulse: 83 81  Resp: 20 14  Temp:    SpO2: 95% 96%    Last Pain:  Vitals:   12/23/20 1300  TempSrc:   PainSc: 0-No pain                 Arita Miss

## 2020-12-23 NOTE — Op Note (Signed)
12/23/2020  12:21 PM  PATIENT:  Anne Ferrell   MRN: [id number]  PRE-OPERATIVE DIAGNOSIS:  Displaced Subcapital fracture right hip   Comminuted displaced right olecranon fracture  Displaced right distal radius and ulna fractures    POST-OPERATIVE DIAGNOSIS: Same  PROCEDURE:  Right   hip hemiarthroplasty with [employer]ker Accolade prosthesis  Open reduction internal fixation right olecranon fracture with tension band wiring  Closed reduction percutaneous pinning right distal radius and ulna fracture  PREOPERATIVE INDICATIONS:  Anne Ferrell is an 87 y.o. female who was admitted 12/22/2020 with a diagnosis of displaced subcapital fracture of the hip and elected for surgical management.  The risks benefits and alternatives were discussed with the patient including but not limited to the risks of nonoperative treatment, versus surgical intervention including infection, bleeding, nerve injury, periprosthetic fracture, the need for revision surgery, dislocation, leg length discrepancy, blood clots, cardiopulmonary complications, morbidity, mortality, among others, and they were willing to proceed.  Predicted outcome is good, although there will be at least a six to nine month expected recovery.     SURGEON:  Earnestine Leys, MD  ASST:    ANESTHESIA: Generqal endotaceal    COMPLICATIONS:  None.   EBL:  150 cc    COMPONENTS:  Stryker Accolade Femoral Fracture stem size # 5  ,   and a size   51 mm  fracture head unipolar hip ball with    +8 mm  neck length.    PROCEDURE IN DETAIL: The patient was met in the holding area and identified.  The appropriate hip  was marked at the operative site. The patient was then transported to the OR and  placed under general anesthesia.  At that point, the patient was  placed in the lateral decubitus position with the operative side up and  secured to the operating room table and all bony prominences padded.     The operative lower extremity was prepped  from the iliac crest to the toes.  Sterile draping was performed.  Time out was performed prior to incision.      A routine posterolateral approach was utilized via sharp dissection  carried down to the subcutaneous tissue.  Gross bleeders were Bovie  coagulated.  The iliotibial band was identified and incised  along the length of the skin incision.  Self-retaining retractors were  inserted.  With the hip internally rotated, the short external rotators  were identified. The piriformis was tagged and the hip capsule released in a T-type fashion.  The femoral neck was exposed, and I resected the femoral neck using the appropriate jig. This was performed at approximately a thumb's breadth above the lesser trochanter.    I then exposed the deep acetabulum, cleared out any tissue including the ligamentum teres.    I then prepared the proximal femur using the cookie-cutter, the lateralizing reamer, and then sequentially broached.  A trial stem   was  utilized along with a unipolar head and neck.  I reduced the hip and it was found to have excellent stability with functional range of motion. Leg lengths were equal.  The trial components were then removed.   The same size Accolade femoral stem was then inserted and was very stable.  The Unitrax head and neck as trialed were inserted as well.     The hip was then reduced and taken through functional range of motion and found to have excellent stability. Leg lengths were restored.     I closed the T in the  capsule with #2 Ticron as well as the short external rotators. A hemovac was inserted.    I then irrigated the hip copiously again with pulse lavage, and repaired the fascia with #2 Quill and the subcutaneous layer with #0 Quill. Sponge and needle counts were correct.  Sterile dressing was applied.  The drapes were removed and the patient returned to the supine position.  A hand table was brought in on the right side of the operating room table.  The  right hand and arm was then prepped and draped in a sterile fashion.  The right distal radius and ulna fractures were manipulated and seen to be in good position under fluoroscopy.  I placed two 0.62 K wires through the radial styloid across the fracture site to protect the fractures during the ensuing olecranon surgery.  The pins were cut bent and buried beneath the skin.  The arm was then exsanguinated and pneumatic tourniquet inflated to 250 mmHg.  Tourniquet time was 53 minutes.  A longitudinal incision was made over the proximal ulnar shaft curving laterally and over the olecranon and extending proximally.  Dissection was carried out bluntly with Metzenbaum scissors.  The fracture site was identified and there was abundant clot present.  Great deal of time was spent clearing the fracture site free of clot and debris.  It was irrigated.  The main port of the heart of the olecranon fit nicely back into the ulnar shaft with some comminution on the sides.  I felt it was most important to stabilize the olecranon and ulna.  The fascia was released off the ulnar crest and a drill hole made through the ulnar shaft about 2 inches distal distal to the fracture site.  The fracture was reduced and held in place with a sweetheart clamp.  Two 0.62 K wires were then drilled through the olecranon into the shaft of the ulna until a became embedded in bone.  Fluoroscopy showed the fracture to be well reduced and both pins to be in good position and the ulna.  Number 20-gauge wire was then passed through the hole in the ulnar shaft and woven in a figure-of-eight bandage figure-of-eight fashion around the proximal pins and and tightened to form a tension band.  The pins were cut and bent and buried.  Fluoroscopy showed satisfactory reduction and good position of the hardware.  This wound was irrigated and closed with 3-0 Vicryl and staples.  After some Marcaine was placed in the wound.  Dry sterile dressings were applied to the  wrist and elbow and a long-arm posterior splint applied over good padding.  Tourniquet was removed with good return of blood flow to the hand.  The patient was then awakened and returned to PACU in stable and satisfactory condition. There were no complications.  Park Breed, MD Orthopedic Surgeon [phone]   12/23/2020 12:21 PM

## 2020-12-23 NOTE — Anesthesia Procedure Notes (Signed)
Procedure Name: Intubation Date/Time: 12/23/2020 8:47 AM Performed by: Aline Brochure, CRNA Pre-anesthesia Checklist: Patient identified, Patient being monitored, Timeout performed, Emergency Drugs available and Suction available Patient Re-evaluated:Patient Re-evaluated prior to induction Oxygen Delivery Method: Circle system utilized Preoxygenation: Pre-oxygenation with 100% oxygen Induction Type: IV induction Ventilation: Mask ventilation without difficulty Laryngoscope Size: 3 and McGraph Grade View: Grade I Tube type: Oral Tube size: 6.5 mm Number of attempts: 1 Airway Equipment and Method: Stylet Placement Confirmation: ETT inserted through vocal cords under direct vision, positive ETCO2 and breath sounds checked- equal and bilateral Secured at: 21 cm Tube secured with: Tape Dental Injury: Teeth and Oropharynx as per pre-operative assessment

## 2020-12-23 NOTE — Plan of Care (Signed)

## 2020-12-23 NOTE — Anesthesia Preprocedure Evaluation (Addendum)
Anesthesia Evaluation  Patient identified by MRN, date of birth, ID band Patient awake  General Assessment Comment:R hip, elbow, and wrist fractures s/p fall. Patient independent at baseline  Reviewed: Allergy & Precautions, NPO status , Patient's Chart, lab work & pertinent test results  History of Anesthesia Complications Negative for: history of anesthetic complications  Airway Mallampati: II  TM Distance: <3 FB Neck ROM: Full    Dental no notable dental hx. (+) Teeth Intact   Pulmonary neg pulmonary ROS, neg sleep apnea, neg COPD, Patient abstained from smoking.Not current smoker,    Pulmonary exam normal breath sounds clear to auscultation       Cardiovascular Exercise Tolerance: Good METShypertension, Pt. on medications (-) CAD and (-) Past MI (-) dysrhythmias + Valvular Problems/Murmurs  Rhythm:Regular Rate:Normal - Systolic murmurs TTE 123456: INTERPRETATION  NORMAL LEFT VENTRICULAR SYSTOLIC FUNCTION  WITH MILD LVH  NORMAL RIGHT VENTRICULAR SYSTOLIC FUNCTION  MILD VALVULAR REGURGITATION (See above)  NO VALVULAR STENOSIS  ESTIMATED LVEF >55%  MILD TO MODERATE AORTIC REGURGITATION  AOV Note: SCLEROTIC AOV WITHOUT STENOSIS  Morphology: MODERATELY THICKENED  Closest EF: >55% (Estimated)  LVH: MILD LVH  Morphology: MILD  Aortic: MILD AR  Mitral: MILD MR  Tricuspid: MILD TR  Borderline MVP     Neuro/Psych negative neurological ROS  negative psych ROS   GI/Hepatic neg GERD  ,(+)     (-) substance abuse  ,   Endo/Other  neg diabetes  Renal/GU negative Renal ROS     Musculoskeletal  (+) Arthritis ,   Abdominal   Peds  Hematology   Anesthesia Other Findings Past Medical History: No date: Arthritis No date: Diverticulitis No date: Glaucoma No date: Heart murmur No date: High blood pressure 10/05/2015: Osteoporosis 09/18/2015: Vitamin D deficiency  Reproductive/Obstetrics                             Anesthesia Physical Anesthesia Plan  ASA: 2 and emergent  Anesthesia Plan: General   Post-op Pain Management:    Induction: Intravenous  PONV Risk Score and Plan: 4 or greater and Ondansetron, Dexamethasone and Treatment may vary due to age or medical condition  Airway Management Planned: Oral ETT  Additional Equipment: None  Intra-op Plan:   Post-operative Plan: Extubation in OR  Informed Consent: I have reviewed the patients History and Physical, chart, labs and discussed the procedure including the risks, benefits and alternatives for the proposed anesthesia with the patient or authorized representative who has indicated his/her understanding and acceptance.     Dental advisory given  Plan Discussed with: CRNA and Surgeon  Anesthesia Plan Comments: (Discussed risks of anesthesia with patient, including PONV, sore throat, lip/dental damage. Rare risks discussed as well, such as cardiorespiratory and neurological sequelae, and allergic reactions. Patient understands.)        Anesthesia Quick Evaluation

## 2020-12-23 NOTE — Transfer of Care (Signed)
Immediate Anesthesia Transfer of Care Note  Patient: Anne Ferrell  Procedure(s) Performed: ARTHROPLASTY BIPOLAR HIP (HEMIARTHROPLASTY) (Right: Hip) OPEN REDUCTION INTERNAL FIXATION (ORIF) ELBOW/OLECRANON FRACTURE (Right: Elbow) CLOSED REDUCTION AND PERCUTANEOUS PINNING RIGHT WRIST (Right: Wrist)  Patient Location: PACU  Anesthesia Type:General  Level of Consciousness: awake  Airway & Oxygen Therapy: Patient Spontanous Breathing and Patient connected to face mask oxygen  Post-op Assessment: Report given to RN and Post -op Vital signs reviewed and stable  Post vital signs: stable  Last Vitals:  Vitals Value Taken Time  BP 94/47 12/23/20 1221  Temp    Pulse 91 12/23/20 1223  Resp 21 12/23/20 1223  SpO2 93 % 12/23/20 1223  Vitals shown include unvalidated device data.  Last Pain:  Vitals:   12/23/20 0437  TempSrc:   PainSc: 7          Complications: No notable events documented.

## 2020-12-23 NOTE — Progress Notes (Signed)
PROGRESS NOTE    Anne Ferrell  [id number] DOB: 1933/02/24 DOA: 12/22/2020 PCP: Owens Loffler, MD   Brief Narrative: Taken from H&P.  Anne Ferrell is a 87 y.o. female with medical history significant of hypertension, anxiety, diverticulitis, osteoporosis, valvular heart disease, who presents with mechanical fall.  Found to have multiple fractures which includes a displaced subcapital right hip fracture, 2 displaced comminuted right olecranon fractures and a displaced distal radius and ulna fracture. CT head and cervical spine was without any acute abnormality.  Orthopedic was consulted and she was taken to the OR for hemiarthroplasty of right hip and ORIF of right distal radius and ulna fractures.  Subjective: Patient was seen after the surgery today.  Tolerated the procedure well.  Pain currently well controlled.  Eating lunch.  Daughter at bedside.  Would like to go to SNF before returning home.  Assessment & Plan:   Principal Problem:   Closed right hip fracture [facility]) Active Problems:   Essential hypertension   Fall at home, initial encounter   Closed olecranon fracture, right, initial encounter   Closed fracture distal radius and ulna, right, initial encounter  Closed right hip fracture, closed olecranon fracture and closed fracture distal radius and ulna, right, initial encounter:  As evidenced by x-ray. S/p right Hip hemiarthroplasty, ORIF of right distal radius and ulna and wiring of displaced fracture of olecranon by orthopedic surgery today. -Continue with pain management -PT/OT evaluation -Continue with supportive care  Essential hypertension.  Blood pressure within goal after the surgery.  Initially some softer blood pressure. -Continue home dose of atenolol  Objective: Vitals:   12/23/20 1245 12/23/20 1300 12/23/20 1315 12/23/20 1347  BP: (!) 84/53 (!) 102/54 (!) 102/56 105/61  Pulse: 83 81 80 81  Resp: '20 14 14 18  '$ Temp:   (!) 97.3 F (36.3 C) 98  F (36.7 C)  TempSrc:      SpO2: 95% 96% 96% 96%  Weight:      Height:        Intake/Output Summary (Last 24 hours) at 12/23/2020 1355 Last data filed at 12/23/2020 1207 Gross per 24 hour  Intake 2652.85 ml  Output 1060 ml  Net 1592.85 ml   Filed Weights   12/22/20 0941  Weight: 59 kg    Examination:  General exam: Appears calm and comfortable  Respiratory system: Clear to auscultation. Respiratory effort normal. Cardiovascular system: S1 & S2 heard, RRR.  Gastrointestinal system: Soft, nontender, nondistended, bowel sounds positive. Central nervous system: Alert and oriented. No focal neurological deficits. Extremities: No edema, no cyanosis, pulses intact and symmetrical.  Right upper and lower extremities with bandages. Psychiatry: Judgement and insight appear normal.   DVT prophylaxis: SCDs-Lovenox after the surgery Code Status: Full Family Communication: Discussed with daughter at bedside.  Patient Disposition Plan:  Status is: Inpatient  Remains inpatient appropriate because:Inpatient level of care appropriate due to severity of illness  Dispo: The patient is from: Home              Anticipated d/c is to:  To be determined              Patient currently is not medically stable to d/c.   Difficult to place patient No               Level of care: Med-Surg  All the records are reviewed and case discussed with Care Management/Social Worker. Management plans discussed with the patient, nursing and they are in agreement.  Consultants:  Orthopedic surgery  Procedures:  Antimicrobials:   Data Reviewed: I have personally reviewed following labs and imaging studies  CBC: Recent Labs  Lab 12/22/20 1141 12/23/20 0636  WBC 17.8* 10.7*  NEUTROABS 16.3*  --   HGB 12.7 12.2  HCT 37.6 35.7*  MCV 94.7 93.9  PLT 210 [id number]   Basic Metabolic Panel: Recent Labs  Lab 12/22/20 1141  NA 137  K 4.0  CL 101  CO2 29  GLUCOSE 130*  BUN 25*  CREATININE 0.72  CALCIUM  8.4*   GFR: Estimated Creatinine Clearance: 47 mL/min (by C-G formula based on SCr of 0.72 mg/dL). Liver Function Tests: Recent Labs  Lab 12/22/20 1141  AST 30  ALT 27  ALKPHOS 69  BILITOT 1.1  PROT 5.7*  ALBUMIN 3.7   No results for input(s): LIPASE, AMYLASE in the last 168 hours. No results for input(s): AMMONIA in the last 168 hours. Coagulation Profile: Recent Labs  Lab 12/22/20 1141  INR 1.1   Cardiac Enzymes: No results for input(s): CKTOTAL, CKMB, CKMBINDEX, TROPONINI in the last 168 hours. BNP (last 3 results) No results for input(s): PROBNP in the last 8760 hours. HbA1C: No results for input(s): HGBA1C in the last 72 hours. CBG: No results for input(s): GLUCAP in the last 168 hours. Lipid Profile: No results for input(s): CHOL, HDL, LDLCALC, TRIG, CHOLHDL, LDLDIRECT in the last 72 hours. Thyroid Function Tests: No results for input(s): TSH, T4TOTAL, FREET4, T3FREE, THYROIDAB in the last 72 hours. Anemia Panel: No results for input(s): VITAMINB12, FOLATE, FERRITIN, TIBC, IRON, RETICCTPCT in the last 72 hours. Sepsis Labs: No results for input(s): PROCALCITON, LATICACIDVEN in the last 168 hours.  Recent Results (from the past 240 hour(s))  Resp Panel by RT-PCR (Flu A&B, Covid) Nasopharyngeal Swab     Status: None   Collection Time: 12/22/20 11:42 AM   Specimen: Nasopharyngeal Swab; Nasopharyngeal(NP) swabs in vial transport medium  Result Value Ref Range Status   SARS Coronavirus 2 by RT PCR NEGATIVE NEGATIVE Final    Comment: (NOTE) SARS-CoV-2 target nucleic acids are NOT DETECTED.  The SARS-CoV-2 RNA is generally detectable in upper respiratory specimens during the acute phase of infection. The lowest concentration of SARS-CoV-2 viral copies this assay can detect is 138 copies/mL. A negative result does not preclude SARS-Cov-2 infection and should not be used as the sole basis for treatment or other patient management decisions. A negative result may  occur with  improper specimen collection/handling, submission of specimen other than nasopharyngeal swab, presence of viral mutation(s) within the areas targeted by this assay, and inadequate number of viral copies(<138 copies/mL). A negative result must be combined with clinical observations, patient history, and epidemiological information. The expected result is Negative.  Fact Sheet for Patients:  EntrepreneurPulse.com.au  Fact Sheet for Healthcare Providers:  IncredibleEmployment.be  This test is no t yet approved or cleared by the [place] FDA and  has been authorized for detection and/or diagnosis of SARS-CoV-2 by FDA under an Emergency Use Authorization (EUA). This EUA will remain  in effect (meaning this test can be used) for the duration of the COVID-19 declaration under Section 564(b)(1) of the Act, 21 U.S.C.section 360bbb-3(b)(1), unless the authorization is terminated  or revoked sooner.       Influenza A by PCR NEGATIVE NEGATIVE Final   Influenza B by PCR NEGATIVE NEGATIVE Final    Comment: (NOTE) The Xpert Xpress SARS-CoV-2/FLU/RSV plus assay is intended as an aid in the diagnosis of influenza from Nasopharyngeal swab  specimens and should not be used as a sole basis for treatment. Nasal washings and aspirates are unacceptable for Xpert Xpress SARS-CoV-2/FLU/RSV testing.  Fact Sheet for Patients: EntrepreneurPulse.com.au  Fact Sheet for Healthcare Providers: IncredibleEmployment.be  This test is not yet approved or cleared by the [place] FDA and has been authorized for detection and/or diagnosis of SARS-CoV-2 by FDA under an Emergency Use Authorization (EUA). This EUA will remain in effect (meaning this test can be used) for the duration of the COVID-19 declaration under Section 564(b)(1) of the Act, 21 U.S.C. section 360bbb-3(b)(1), unless the authorization is terminated  or revoked.  Performed at [facility], [address], [place], [place] [address]      Radiology Studies: DG Elbow 2 Views Right  Result Date: 12/23/2020 CLINICAL DATA:  Status post surgical internal fixation of olecranon fracture. EXAM: RIGHT ELBOW - 2 VIEW COMPARISON:  December 22, 2020. FINDINGS: The right elbow has been splinted and immobilized. The patient is status post open reduction and surgical internal fixation of olecranon fracture. Good alignment of fracture components is noted. IMPRESSION: Status post surgical internal fixation of olecranon fracture. Electronically Signed   By: Marijo Conception M.D.   On: 12/23/2020 13:47   DG Elbow 2 Views Right  Result Date: 12/22/2020 CLINICAL DATA:  Fall with pain. EXAM: RIGHT ELBOW - 2 VIEW COMPARISON:  None. FINDINGS: Displaced and comminuted fracture of the olecranon. Normal alignment of the radial head with the capitellum. Extensive soft tissue swelling in the right elbow with a joint effusion. IMPRESSION: Comminuted and displaced fracture of the olecranon. Electronically Signed   By: Markus Daft M.D.   On: 12/22/2020 13:10   DG Wrist 2 Views Right  Result Date: 12/23/2020 CLINICAL DATA:  Status post surgical internal fixation of radial fracture. EXAM: RIGHT WRIST - 2 VIEW COMPARISON:  December 22, 2020. FINDINGS: The right wrist has been splinted and immobilized. 2 surgical pins are seen fixating comminuted and displaced distal right radial fracture with stable alignment of fracture components. Continued mildly displaced right ulnar fracture is noted. IMPRESSION: Status post surgical internal fixation of distal right radial fracture. Electronically Signed   By: Marijo Conception M.D.   On: 12/23/2020 13:46   DG Wrist Complete Right  Result Date: 12/22/2020 CLINICAL DATA:  Right wrist pain after fall. EXAM: RIGHT WRIST - COMPLETE 3+ VIEW COMPARISON:  None. FINDINGS: Moderately displaced and comminuted fractures are seen involving the  distal right radius and ulna. Moderate osteoarthritis is seen involving the first carpometacarpal joint. IMPRESSION: Moderately displaced and comminuted distal right radial and ulnar fractures. Electronically Signed   By: Marijo Conception M.D.   On: 12/22/2020 11:02   CT HEAD WO CONTRAST (5MM)  Result Date: 12/22/2020 CLINICAL DATA:  Head trauma, minor (Age >= 65y); Neck trauma (Age >= 65y) EXAM: CT HEAD WITHOUT CONTRAST CT CERVICAL SPINE WITHOUT CONTRAST TECHNIQUE: Multidetector CT imaging of the head and cervical spine was performed following the standard protocol without intravenous contrast. Multiplanar CT image reconstructions of the cervical spine were also generated. COMPARISON:  None. FINDINGS: CT HEAD FINDINGS Brain: No evidence of acute large vascular territory infarction, hemorrhage, hydrocephalus, extra-axial collection or mass lesion/mass effect. Mild to moderate patchy white matter hypoattenuation, likely related to chronic microvascular ischemic disease. Generalized mild to moderate atrophy with ex vacuo ventricular dilation. Streak artifact limits evaluation of the posterior fossa. Vascular: No hyperdense vessel identified. Calcific intracranial atherosclerosis. Skull: No acute fracture. Sinuses/Orbits: Clear visualized sinuses. No acute  orbital findings. Other: No mastoid effusions. CT CERVICAL SPINE FINDINGS Alignment: Straightening of the normal cervical lordosis. Mild anterolisthesis of C5 on C6, likely degenerative given degenerative changes at this level. Mild dextrocurvature. Skull base and vertebrae: No evidence of acute fracture. Nonspecific discrete lucent lesion involving the posterior elements at C2. Soft tissues and spinal canal: No prevertebral fluid or swelling. No visible canal hematoma. Disc levels: Craniocervical degenerative change. Multilevel degenerative disc disease, greatest and moderate to severe on the left at C6-C7 where there is disc height loss, endplate sclerosis and  endplate spurring. Likely at least mild to moderate canal stenosis at this level. Multilevel bulky left facet arthropathy, greatest and severe on the left at C5-C6 where there is osseous fusion across the facet joint. Ossified posterior ligamentum flavum on the left at T1-T2. Upper chest: Biapical pleuroparenchymal scarring. Otherwise, visualized lung apices are clear. The thyroid is partially imaged with multiple subcentimeter thyroid nodules. Not clinically significant; no follow-up imaging recommended (ref: J Am Coll Radiol. 2015 Feb;12(2): 143-50). Other: Nonspecific small sclerotic lesion in the posterior left rib. IMPRESSION: CT head: 1. No evidence of acute intracranial abnormality. 2. Mild to moderate chronic microvascular ischemic disease and atrophy. CT cervical spine: 1. No evidence of acute fracture or traumatic malalignment. 2. Multilevel moderate to severe degenerative change with likely at least mild to moderate canal stenosis at C6-C7 and bulky multilevel left facet hypertrophy. An MRI of the cervical spine could further evaluate the canal and foramina if clinically indicated. 3. Discrete lucent lesion involving the posterior elements at C2 and small sclerotic lesion involving the posterior left second rib, nonspecific but most likely benign in the absence of a known primary malignancy. Electronically Signed   By: Margaretha Sheffield M.D.   On: 12/22/2020 11:24   CT Cervical Spine Wo Contrast  Result Date: 12/22/2020 CLINICAL DATA:  Head trauma, minor (Age >= 65y); Neck trauma (Age >= 65y) EXAM: CT HEAD WITHOUT CONTRAST CT CERVICAL SPINE WITHOUT CONTRAST TECHNIQUE: Multidetector CT imaging of the head and cervical spine was performed following the standard protocol without intravenous contrast. Multiplanar CT image reconstructions of the cervical spine were also generated. COMPARISON:  None. FINDINGS: CT HEAD FINDINGS Brain: No evidence of acute large vascular territory infarction, hemorrhage,  hydrocephalus, extra-axial collection or mass lesion/mass effect. Mild to moderate patchy white matter hypoattenuation, likely related to chronic microvascular ischemic disease. Generalized mild to moderate atrophy with ex vacuo ventricular dilation. Streak artifact limits evaluation of the posterior fossa. Vascular: No hyperdense vessel identified. Calcific intracranial atherosclerosis. Skull: No acute fracture. Sinuses/Orbits: Clear visualized sinuses. No acute orbital findings. Other: No mastoid effusions. CT CERVICAL SPINE FINDINGS Alignment: Straightening of the normal cervical lordosis. Mild anterolisthesis of C5 on C6, likely degenerative given degenerative changes at this level. Mild dextrocurvature. Skull base and vertebrae: No evidence of acute fracture. Nonspecific discrete lucent lesion involving the posterior elements at C2. Soft tissues and spinal canal: No prevertebral fluid or swelling. No visible canal hematoma. Disc levels: Craniocervical degenerative change. Multilevel degenerative disc disease, greatest and moderate to severe on the left at C6-C7 where there is disc height loss, endplate sclerosis and endplate spurring. Likely at least mild to moderate canal stenosis at this level. Multilevel bulky left facet arthropathy, greatest and severe on the left at C5-C6 where there is osseous fusion across the facet joint. Ossified posterior ligamentum flavum on the left at T1-T2. Upper chest: Biapical pleuroparenchymal scarring. Otherwise, visualized lung apices are clear. The thyroid is partially imaged with multiple  subcentimeter thyroid nodules. Not clinically significant; no follow-up imaging recommended (ref: J Am Coll Radiol. 2015 Feb;12(2): 143-50). Other: Nonspecific small sclerotic lesion in the posterior left rib. IMPRESSION: CT head: 1. No evidence of acute intracranial abnormality. 2. Mild to moderate chronic microvascular ischemic disease and atrophy. CT cervical spine: 1. No evidence of  acute fracture or traumatic malalignment. 2. Multilevel moderate to severe degenerative change with likely at least mild to moderate canal stenosis at C6-C7 and bulky multilevel left facet hypertrophy. An MRI of the cervical spine could further evaluate the canal and foramina if clinically indicated. 3. Discrete lucent lesion involving the posterior elements at C2 and small sclerotic lesion involving the posterior left second rib, nonspecific but most likely benign in the absence of a known primary malignancy. Electronically Signed   By: Margaretha Sheffield M.D.   On: 12/22/2020 11:24   DG Chest Portable 1 View  Result Date: 12/22/2020 CLINICAL DATA:  Hip fracture.  Preop. EXAM: PORTABLE CHEST 1 VIEW COMPARISON:  02/16/2015 FINDINGS: Chronic coarse lung markings. Few densities at the left lung base are suggestive for atelectasis. Otherwise, the lungs are clear. Negative for a pneumothorax. Heart and mediastinum are within normal limits. IMPRESSION: Probable left basilar atelectasis. Otherwise, no acute chest findings. Electronically Signed   By: Markus Daft M.D.   On: 12/22/2020 13:07   DG MINI C-ARM IMAGE ONLY  Result Date: 12/23/2020 There is no interpretation for this exam.  This order is for images obtained during a surgical procedure.  Please See "Surgeries" Tab for more information regarding the procedure.   DG HIP UNILAT WITH PELVIS 1V RIGHT  Result Date: 12/22/2020 CLINICAL DATA:  Fall, fracture EXAM: DG HIP (WITH OR WITHOUT PELVIS) 1V RIGHT COMPARISON:  Same day hip radiograph FINDINGS: Single cross-table lateral view of the right hip demonstrates a mildly displaced subcapital femoral neck fracture. IMPRESSION: Mildly displaced subcapital femoral neck fracture. Electronically Signed   By: Maurine Simmering M.D.   On: 12/22/2020 13:11   DG Hip Port Unilat With Pelvis 1V Right  Result Date: 12/23/2020 CLINICAL DATA:  Status post right hip hemiarthroplasty. EXAM: DG HIP (WITH OR WITHOUT PELVIS) 1V PORT  RIGHT COMPARISON:  None. FINDINGS: Right hip prosthesis appears to be well situated. Expected postoperative changes are seen in the surrounding soft tissues. IMPRESSION: Status post right hip hemiarthroplasty. Electronically Signed   By: Marijo Conception M.D.   On: 12/23/2020 13:44   DG Hip Unilat W or Wo Pelvis 2-3 Views Right  Result Date: 12/22/2020 CLINICAL DATA:  Right hip pain after fall. EXAM: DG HIP (WITH OR WITHOUT PELVIS) 2-3V RIGHT COMPARISON:  None. FINDINGS: Moderately displaced proximal right femoral neck fracture is noted. IMPRESSION: Moderately displaced proximal right femoral neck fracture. Electronically Signed   By: Marijo Conception M.D.   On: 12/22/2020 11:00    Scheduled Meds:  atenolol  25 mg Oral Daily   chlorhexidine  1 application Topical Once   cholecalciferol  1,000 Units Oral Daily   magnesium oxide  400 mg Oral BID   multivitamin-lutein  1 capsule Oral Daily   vitamin A  10,000 Units Oral Daily   vitamin B-12  1,000 mcg Oral Daily   Continuous Infusions:  sodium chloride Stopped (12/23/20 0836)     LOS: 1 day   Time spent: 40 minutes. More than 50% of the time was spent in counseling/coordination of care  Lorella Nimrod, MD [facility]  If 7PM-7AM, please contact night-coverage [email or web address]  12/23/2020, 1:55  PM   This record has been created using Systems analyst. Errors have been sought and corrected,but may not always be located. Such creation errors do not reflect on the standard of care.

## 2020-12-23 NOTE — H&P (Signed)
THE PATIENT WAS SEEN PRIOR TO SURGERY TODAY.  HISTORY, ALLERGIES, HOME MEDICATIONS AND OPERATIVE PROCEDURE WERE REVIEWED. RISKS AND BENEFITS OF SURGERY DISCUSSED WITH PATIENT AGAIN.  NO CHANGES FROM INITIAL HISTORY AND PHYSICAL NOTED.    

## 2020-12-24 LAB — BASIC METABOLIC PANEL
Anion gap: 5 (ref 5–15)
BUN: 19 mg/dL (ref 8–23)
CO2: 25 mmol/L (ref 22–32)
Calcium: 7.7 mg/dL — ABNORMAL LOW (ref 8.9–10.3)
Chloride: 103 mmol/L (ref 98–111)
Creatinine, Ser: 0.65 mg/dL (ref 0.44–1.00)
GFR, Estimated: >60 mL/min (ref >60)
Glucose, Bld: 137 mg/dL — ABNORMAL HIGH (ref 70–99)
Potassium: 4.3 mmol/L (ref 3.5–5.1)
Sodium: 133 mmol/L — ABNORMAL LOW (ref 135–145)

## 2020-12-24 LAB — CBC
HCT: 28.1 % — ABNORMAL LOW (ref 36.0–46.0)
Hemoglobin: 9.8 g/dL — ABNORMAL LOW (ref 12.0–15.0)
MCH: 33.1 pg (ref 26.0–34.0)
MCHC: 34.9 g/dL (ref 30.0–36.0)
MCV: 94.9 fL (ref 80.0–100.0)
Platelets: 132 10*3/uL — ABNORMAL LOW (ref 150–400)
RBC: 2.96 MIL/uL — ABNORMAL LOW (ref 3.87–5.11)
RDW: 13 % (ref 11.5–15.5)
WBC: 10.6 10*3/uL — ABNORMAL HIGH (ref 4.0–10.5)
nRBC: 0 % (ref 0.0–0.2)

## 2020-12-24 MED ORDER — ENSURE SURGERY PO LIQD
237.0000 mL | Freq: Two times a day (BID) | ORAL | Status: DC
  Administered 2020-12-24 – 2020-12-26 (×4): 237 mL via ORAL

## 2020-12-24 MED ORDER — FE FUMARATE-B12-VIT C-FA-IFC PO CAPS: 1.0000 | ORAL_CAPSULE | Freq: Two times a day (BID) | ORAL | Status: DC

## 2020-12-24 NOTE — TOC Progression Note (Addendum)
Transition of Care [facility]) - Progression Note    Patient Details  Name: Anne Ferrell MRN: [id number] Date of Birth: 09/10/1932  Transition of Care [facility]) CM/SW [place], RN Phone Number: 12/24/2020, 12:24 PM  Clinical Narrative:    Spoke with the patient at the bedside, she asked that I call her daughter, I called [name] her daughter.  They prefer [facility], I explained [facility] is often full but we would try to get a bed, She agreed to a bed search in [place] and [place], only a few facilities they are willing to go to [facility] in [place], [facility] or [facility] in [place], sent out bedsearch and am awaiting bed offers, I called Seth Bake at [facility] and she will look at it on Monday      Expected Discharge Plan and Services                                                 Social Determinants of Health (SDOH) Interventions    Readmission Risk Interventions No flowsheet data found.

## 2020-12-24 NOTE — Progress Notes (Signed)
Initial Nutrition Assessment  INTERVENTION:   -Ensure Surgery PO BID, each provides 330 kcals and 18g protein  NUTRITION DIAGNOSIS:   Increased nutrient needs related to post-op healing, hip fracture as evidenced by estimated needs.  GOAL:   Patient will meet greater than or equal to 90% of their needs  MONITOR:   PO intake, Supplement acceptance, Labs, Weight trends, I & O's  REASON FOR ASSESSMENT:   Consult Hip fracture protocol  ASSESSMENT:   87 y.o. female with medical history significant of hypertension, anxiety, diverticulitis, osteoporosis, valvular heart disease, who presents with mechanical fall.     Found to have multiple fractures which includes a displaced subcapital right hip fracture, 2 displaced comminuted right olecranon fractures and a displaced distal radius and ulna fracture.  8/27: s/p rt hemiarthroplasty, Open reduction internal fixation right olecranon fracture, Closed reduction percutaneous pinning right distal radius and ulna fracture  Patient currently consuming 50-90% of meals when on a diet. To aid in healing from fractures, will add Ensure Surgery supplements.  Per weight records, no weight changes.  Medications: OSCAL, Vitamin D, Colace, Ferrous sulfate, MAG-OX, Ocuvite, Vitamin A, Vitamin B-12  Labs reviewed: Low Na   NUTRITION - FOCUSED PHYSICAL EXAM:  Unable to complete- RD remote  Diet Order:   Diet Order             Diet Heart Room service appropriate? Yes; Fluid consistency: Thin  Diet effective now                   EDUCATION NEEDS:   No education needs have been identified at this time  Skin:  Skin Assessment: Skin Integrity Issues: Skin Integrity Issues:: Incisions Incisions: 8/27: right hip, right elbow, right wrist  Last BM:  8/25  Height:   Ht Readings from Last 1 Encounters:  12/22/20 '5\' 9"'$  (1.753 m)    Weight:   Wt Readings from Last 1 Encounters:  12/22/20 59 kg    BMI:  Body mass index is 19.2  kg/m.  Estimated Nutritional Needs:   Kcal:  1500-1700  Protein:  70-85g  Fluid:  1.7L/day  Clayton Bibles, MS, RD, LDN Inpatient Clinical Dietitian Contact information available via Amion

## 2020-12-24 NOTE — Progress Notes (Signed)
Subjective: 1 Day Post-Op Procedure(s) (LRB): ARTHROPLASTY BIPOLAR HIP (HEMIARTHROPLASTY) (Right) OPEN REDUCTION INTERNAL FIXATION (ORIF) ELBOW/OLECRANON FRACTURE (Right) CLOSED REDUCTION AND PERCUTANEOUS PINNING RIGHT WRIST (Right)   Patient is alert and awake and sitting up in bed.  She appears to be comfortable.  She is been up at the bedside with PT.  There is some blood on the arm bandage which was reinforced.  Pain is moderate.  Hemoglobin is satisfactory. Patient reports pain as moderate.  Objective:   VITALS:   Vitals:   12/24/20 0759 12/24/20 0759  BP: 139/70 139/70  Pulse: 93 91  Resp: 18 18  Temp: 100.2 F (37.9 C) 100.2 F (37.9 C)  SpO2: 96% 97%    Neurologically intact Incision: scant drainage  LABS Recent Labs    12/23/20 0636 12/23/20 1937 12/24/20 0540  HGB 12.2 10.7* 9.8*  HCT 35.7* 31.1* 28.1*  WBC 10.7* 11.8* 10.6*  PLT 174 157 132*    Recent Labs    12/22/20 1141 12/23/20 1937 12/24/20 0540  NA 137  --  133*  K 4.0  --  4.3  BUN 25*  --  19  CREATININE 0.72 0.82 0.65  GLUCOSE 130*  --  137*    Recent Labs    12/22/20 1141  INR 1.1    Plan: Advance PT as tolerated. She will need skilled nursing rehab. Discharge on enteric-coated aspirin twice daily. She should remain partial weightbearing on the right leg and use the right arm with a platform walker.  Assessment/Plan: 1 Day Post-Op Procedure(s) (LRB): ARTHROPLASTY BIPOLAR HIP (HEMIARTHROPLASTY) (Right) OPEN REDUCTION INTERNAL FIXATION (ORIF) ELBOW/OLECRANON FRACTURE (Right) CLOSED REDUCTION AND PERCUTANEOUS PINNING RIGHT WRIST (Right)

## 2020-12-24 NOTE — Evaluation (Signed)
Occupational Therapy Evaluation Patient Details Name: Anne Ferrell MRN: [id number] DOB: Aug 16, 1932 Today's Date: 12/24/2020    History of Present Illness 87 y.o. female with medical history significant of hypertension, anxiety, diverticulitis, osteoporosis, valvular heart disease, who presents with fall. Pt found to have R hip fx, R olecranon fx, and distal radius and ulna fx. Pt now s/p R hip hemiarthroplasty (posterior approach), ORIF of right olecranon fracture, and closed reduction percutaneous pinning of right distal radius and ulna fracture.   Clinical Impression   Pt seen for OT evaluation this date, POD#1 from above surgery. At baseline, pt is independent with ADLs, IADLs, and functional mobility. Pt denies any other falls within the past 12 months. Pt currently requires MOD A for seated UB dressing and MAX A for bed-level LB dressing due to pain and limited AROM of R UE and R hip. Additionally, pt requires MAX A+2 for bed mobility and MAX A+2 for sit<>stand transfers with R platform walker. Pt instructed in posterior total hip precautions and how to implement, self care skills, falls prevention strategies, home/routines modifications, and DME/AE for LB bathing and dressing tasks. At end of session, pt able to recall 2/3 posterior total hip precautions. Pt would benefit from additional instruction in self care skills and techniques to help maintain precautions with or without assistive devices to support recall and carryover prior to discharge. Recommend SNF upon discharge.       Follow Up Recommendations  SNF    Equipment Recommendations  Other (comment) (defer to next venue of care)       Precautions / Restrictions Precautions Precautions: Fall Restrictions Weight Bearing Restrictions: Yes RUE Weight Bearing: Weight bear through elbow only RLE Weight Bearing: Partial weight bearing RLE Partial Weight Bearing Percentage or Pounds: 50lbs      Mobility Bed Mobility Overal bed  mobility: Needs Assistance Bed Mobility: Supine to Sit;Sit to Supine     Supine to sit: Max assist;Total assist;+2 for safety/equipment;HOB elevated Sit to supine: Max assist;+2 for physical assistance        Transfers Overall transfer level: Needs assistance Equipment used: Right platform walker Transfers: Sit to/from Stand Sit to Stand: Max assist;+2 physical assistance         General transfer comment: x2 bouts. Requires MAX verbal cues for safe hand placement.    Balance Overall balance assessment: Needs assistance Sitting-balance support: No upper extremity supported;Feet supported Sitting balance-Leahy Scale: Good Sitting balance - Comments: Good balance seated EOB while performing UB dressing/grooming   Standing balance support: Bilateral upper extremity supported;During functional activity Standing balance-Leahy Scale: Zero Standing balance comment: Requires MAX A to maintain static standing balance d/t pt reporting R hip pain                           ADL either performed or assessed with clinical judgement   ADL Overall ADL's : Needs assistance/impaired     Grooming: Wash/dry hands;Wash/dry face;Supervision/safety;Set up;Sitting Grooming Details (indicate cue type and reason): using LUE to wash face/hands with washcloth         Upper Body Dressing : Moderate assistance;Sitting Upper Body Dressing Details (indicate cue type and reason): to don/doff hospital gown via hemi-dressing strategy Lower Body Dressing: Maximal assistance;Bed level Lower Body Dressing Details (indicate cue type and reason): to don socks             Functional mobility during ADLs: Maximal assistance;+2 for physical assistance;Rolling walker  Vision Baseline Vision/History: 1 Wears glasses              Pertinent Vitals/Pain Pain Assessment: Faces Faces Pain Scale: Hurts whole lot Pain Location: R elbow/wrist and R hip Pain Descriptors / Indicators:  Aching;Discomfort;Grimacing;Guarding Pain Intervention(s): Limited activity within patient's tolerance;Monitored during session;RN gave pain meds during session;Repositioned;Ice applied     Hand Dominance Right   Extremity/Trunk Assessment Upper Extremity Assessment Upper Extremity Assessment: LUE deficits/detail;RUE deficits/detail RUE: Unable to fully assess due to immobilization LUE Deficits / Details: WFL   Lower Extremity Assessment Lower Extremity Assessment: Defer to PT evaluation;Generalized weakness       Communication Communication Communication: No difficulties   Cognition Arousal/Alertness: Awake/alert Behavior During Therapy: WFL for tasks assessed/performed Overall Cognitive Status: Within Functional Limits for tasks assessed                                 General Comments: Alert and oriented to self, place, and situation. Anxious with OOB mobility, however agreeable following MOD encouragement      Exercises Other Exercises Other Exercises: Educated pt and daughter on role of OT, POC, and d/c recommendations Other Exercises: Educated pt and daughter on weightbearing and posterior hip precautions, Pt only able to recall 2/3 posterior hip precautions at end of session        Home Living Family/patient expects to be discharged to:: Private residence Living Arrangements: Alone Available Help at Discharge: Family (daughter) Type of Home: House Home Access: Stairs to enter [employer] of Steps: 3 from garage Entrance Stairs-Rails: [place]: Two level;Able to live on main level with bedroom/bathroom     Bathroom Shower/Tub: Tub/shower unit         Home Equipment: None          Prior Functioning/Environment Level of Independence: Independent                 OT Problem List: Pain;Impaired UE functional use;Decreased strength;Decreased activity tolerance;Impaired balance (sitting and/or standing);Decreased range  of motion;Decreased knowledge of precautions;Decreased knowledge of use of DME or AE      OT Treatment/Interventions: Self-care/ADL training;Therapeutic exercise;DME and/or AE instruction;Therapeutic activities;Visual/perceptual remediation/compensation;Patient/family education;Balance training    OT Goals(Current goals can be found in the care plan section) Acute Rehab OT Goals Patient Stated Goal: to have less pain OT Goal Formulation: With patient Time For Goal Achievement: 01/07/21 Potential to Achieve Goals: Fair ADL Goals Pt Will Perform Upper Body Dressing: with supervision;with set-up;sitting Pt Will Perform Lower Body Dressing: with min assist;with adaptive equipment;sitting/lateral leans Pt Will Transfer to Toilet: with mod assist;with +2 assist;stand pivot transfer;bedside commode  OT Frequency: Min 2X/week    AM-PAC OT "6 Clicks" Daily Activity     Outcome Measure Help from another person eating meals?: A Little Help from another person taking care of personal grooming?: A Little Help from another person toileting, which includes using toliet, bedpan, or urinal?: A Lot Help from another person bathing (including washing, rinsing, drying)?: A Lot Help from another person to put on and taking off regular upper body clothing?: A Lot Help from another person to put on and taking off regular lower body clothing?: A Lot 6 Click Score: 14   End of Session Equipment Utilized During Treatment: Gait belt;Other (comment) (Right platform walker) Nurse Communication: Mobility status;Other (comment) (bleeding at R elbow)  Activity Tolerance: Patient limited by pain Patient left: in bed;with call bell/phone within reach;with  bed alarm set;with family/visitor present  OT Visit Diagnosis: Unsteadiness on feet (R26.81);Muscle weakness (generalized) (M62.81);History of falling (Z91.81);Pain Pain - Right/Left: Right Pain - part of body: Arm;Hip                Time: 0920-1002 OT Time  Calculation (min): 42 min Charges:  OT General Charges $OT Visit: 1 Visit OT Evaluation $OT Eval Moderate Complexity: 1 Mod OT Treatments $Self Care/Home Management : 8-22 mins  Fredirick Maudlin, OTR/L [place]

## 2020-12-24 NOTE — Progress Notes (Signed)
PROGRESS NOTE    Anne Ferrell  [id number] DOB: May 20, 1932 DOA: 12/22/2020 PCP: Owens Loffler, MD   Brief Narrative: Taken from H&P.  Anne Ferrell is a 87 y.o. female with medical history significant of hypertension, anxiety, diverticulitis, osteoporosis, valvular heart disease, who presents with mechanical fall.  Found to have multiple fractures which includes a displaced subcapital right hip fracture, 2 displaced comminuted right olecranon fractures and a displaced distal radius and ulna fracture. CT head and cervical spine was without any acute abnormality.  Orthopedic was consulted and she was taken to the OR for hemiarthroplasty of right hip and ORIF of right distal radius and ulna fractures.  Patient will be going to rehab in next 1 to 2 days once bed is available.  Subjective: Patient was seen and examined today.  Pain well controlled under current regimen.  Very optimistic and no new complaints.  Assessment & Plan:   Principal Problem:   Closed right hip fracture [facility]) Active Problems:   Essential hypertension   Fall at home, initial encounter   Closed olecranon fracture, right, initial encounter   Closed fracture distal radius and ulna, right, initial encounter  Closed right hip fracture, closed olecranon fracture and closed fracture distal radius and ulna, right, initial encounter:  As evidenced by x-ray. S/p right Hip hemiarthroplasty, ORIF of right distal radius and ulna and wiring of displaced fracture of olecranon by orthopedic surgery today. -Continue with pain management -PT/OT evaluation-recommending SNF placement -TOC is working on it -Continue with supportive care  Essential hypertension.  Blood pressure within goal after the surgery.  Initially some softer blood pressure. -Continue home dose of atenolol  Postsurgical anemia.  Most likely with blood loss with injury and surgery.  Hemoglobin at 9.8, from 10.7 before surgery. -Iron  supplement -Continue to monitor  Objective: Vitals:   12/23/20 2123 12/24/20 0415 12/24/20 0759 12/24/20 0759  BP: (!) 105/54 127/68 139/70 139/70  Pulse: 83 91 93 91  Resp: '20 18 18 18  '$ Temp: 97.7 F (36.5 C) 99.6 F (37.6 C) 100.2 F (37.9 C) 100.2 F (37.9 C)  TempSrc: Oral Oral    SpO2: 91% 95% 96% 97%  Weight:      Height:        Intake/Output Summary (Last 24 hours) at 12/24/2020 1444 Last data filed at 12/24/2020 1051 Gross per 24 hour  Intake 1402.4 ml  Output 900 ml  Net 502.4 ml    Filed Weights   12/22/20 0941  Weight: 59 kg    Examination:  General.  Pleasant elderly lady, in no acute distress. Pulmonary.  Lungs clear bilaterally, normal respiratory effort. CV.  Regular rate and rhythm, no JVD, rub or murmur. Abdomen.  Soft, nontender, nondistended, BS positive. CNS.  Alert and oriented .  No focal neurologic deficit. Extremities.  No edema, no cyanosis, pulses intact and symmetrical.  Right upper and lower extremity with clean bandages and Ace wrap. Psychiatry.  Judgment and insight appears normal.   DVT prophylaxis: SCDs-Lovenox  Code Status: Full Family Communication:  Disposition Plan:  Status is: Inpatient  Remains inpatient appropriate because:Inpatient level of care appropriate due to severity of illness  Dispo: The patient is from: Home              Anticipated d/c is to:  To be determined              Patient currently is not medically stable to d/c.   Difficult to place patient No  Level of care: Med-Surg  All the records are reviewed and case discussed with Care Management/Social Worker. Management plans discussed with the patient, nursing and they are in agreement.  Consultants:  Orthopedic surgery  Procedures:  Antimicrobials:   Data Reviewed: I have personally reviewed following labs and imaging studies  CBC: Recent Labs  Lab 12/22/20 1141 12/23/20 0636 12/23/20 1937 12/24/20 0540  WBC 17.8* 10.7* 11.8*  10.6*  NEUTROABS 16.3*  --   --   --   HGB 12.7 12.2 10.7* 9.8*  HCT 37.6 35.7* 31.1* 28.1*  MCV 94.7 93.9 94.8 94.9  PLT 210 174 157 132*    Basic Metabolic Panel: Recent Labs  Lab 12/22/20 1141 12/23/20 1937 12/24/20 0540  NA 137  --  133*  K 4.0  --  4.3  CL 101  --  103  CO2 29  --  25  GLUCOSE 130*  --  137*  BUN 25*  --  19  CREATININE 0.72 0.82 0.65  CALCIUM 8.4*  --  7.7*    GFR: Estimated Creatinine Clearance: 47 mL/min (by C-G formula based on SCr of 0.65 mg/dL). Liver Function Tests: Recent Labs  Lab 12/22/20 1141  AST 30  ALT 27  ALKPHOS 69  BILITOT 1.1  PROT 5.7*  ALBUMIN 3.7    No results for input(s): LIPASE, AMYLASE in the last 168 hours. No results for input(s): AMMONIA in the last 168 hours. Coagulation Profile: Recent Labs  Lab 12/22/20 1141  INR 1.1    Cardiac Enzymes: No results for input(s): CKTOTAL, CKMB, CKMBINDEX, TROPONINI in the last 168 hours. BNP (last 3 results) No results for input(s): PROBNP in the last 8760 hours. HbA1C: No results for input(s): HGBA1C in the last 72 hours. CBG: No results for input(s): GLUCAP in the last 168 hours. Lipid Profile: No results for input(s): CHOL, HDL, LDLCALC, TRIG, CHOLHDL, LDLDIRECT in the last 72 hours. Thyroid Function Tests: No results for input(s): TSH, T4TOTAL, FREET4, T3FREE, THYROIDAB in the last 72 hours. Anemia Panel: No results for input(s): VITAMINB12, FOLATE, FERRITIN, TIBC, IRON, RETICCTPCT in the last 72 hours. Sepsis Labs: No results for input(s): PROCALCITON, LATICACIDVEN in the last 168 hours.  Recent Results (from the past 240 hour(s))  Resp Panel by RT-PCR (Flu A&B, Covid) Nasopharyngeal Swab     Status: None   Collection Time: 12/22/20 11:42 AM   Specimen: Nasopharyngeal Swab; Nasopharyngeal(NP) swabs in vial transport medium  Result Value Ref Range Status   SARS Coronavirus 2 by RT PCR NEGATIVE NEGATIVE Final    Comment: (NOTE) SARS-CoV-2 target nucleic acids  are NOT DETECTED.  The SARS-CoV-2 RNA is generally detectable in upper respiratory specimens during the acute phase of infection. The lowest concentration of SARS-CoV-2 viral copies this assay can detect is 138 copies/mL. A negative result does not preclude SARS-Cov-2 infection and should not be used as the sole basis for treatment or other patient management decisions. A negative result may occur with  improper specimen collection/handling, submission of specimen other than nasopharyngeal swab, presence of viral mutation(s) within the areas targeted by this assay, and inadequate number of viral copies(<138 copies/mL). A negative result must be combined with clinical observations, patient history, and epidemiological information. The expected result is Negative.  Fact Sheet for Patients:  EntrepreneurPulse.com.au  Fact Sheet for Healthcare Providers:  IncredibleEmployment.be  This test is no t yet approved or cleared by the [place] FDA and  has been authorized for detection and/or diagnosis of SARS-CoV-2 by FDA  under an Emergency Use Authorization (EUA). This EUA will remain  in effect (meaning this test can be used) for the duration of the COVID-19 declaration under Section 564(b)(1) of the Act, 21 U.S.C.section 360bbb-3(b)(1), unless the authorization is terminated  or revoked sooner.       Influenza A by PCR NEGATIVE NEGATIVE Final   Influenza B by PCR NEGATIVE NEGATIVE Final    Comment: (NOTE) The Xpert Xpress SARS-CoV-2/FLU/RSV plus assay is intended as an aid in the diagnosis of influenza from Nasopharyngeal swab specimens and should not be used as a sole basis for treatment. Nasal washings and aspirates are unacceptable for Xpert Xpress SARS-CoV-2/FLU/RSV testing.  Fact Sheet for Patients: EntrepreneurPulse.com.au  Fact Sheet for Healthcare Providers: IncredibleEmployment.be  This test is  not yet approved or cleared by the [place] FDA and has been authorized for detection and/or diagnosis of SARS-CoV-2 by FDA under an Emergency Use Authorization (EUA). This EUA will remain in effect (meaning this test can be used) for the duration of the COVID-19 declaration under Section 564(b)(1) of the Act, 21 U.S.C. section 360bbb-3(b)(1), unless the authorization is terminated or revoked.  Performed at [facility], [address], [place], [place] [address]       Radiology Studies: DG Elbow 2 Views Right  Result Date: 12/23/2020 CLINICAL DATA:  Status post surgical internal fixation of olecranon fracture. EXAM: RIGHT ELBOW - 2 VIEW COMPARISON:  December 22, 2020. FINDINGS: The right elbow has been splinted and immobilized. The patient is status post open reduction and surgical internal fixation of olecranon fracture. Good alignment of fracture components is noted. IMPRESSION: Status post surgical internal fixation of olecranon fracture. Electronically Signed   By: Marijo Conception M.D.   On: 12/23/2020 13:47   DG Wrist 2 Views Right  Result Date: 12/23/2020 CLINICAL DATA:  Status post surgical internal fixation of radial fracture. EXAM: RIGHT WRIST - 2 VIEW COMPARISON:  December 22, 2020. FINDINGS: The right wrist has been splinted and immobilized. 2 surgical pins are seen fixating comminuted and displaced distal right radial fracture with stable alignment of fracture components. Continued mildly displaced right ulnar fracture is noted. IMPRESSION: Status post surgical internal fixation of distal right radial fracture. Electronically Signed   By: Marijo Conception M.D.   On: 12/23/2020 13:46   DG MINI C-ARM IMAGE ONLY  Result Date: 12/23/2020 There is no interpretation for this exam.  This order is for images obtained during a surgical procedure.  Please See "Surgeries" Tab for more information regarding the procedure.   DG Hip Port Unilat With Pelvis 1V Right  Result Date:  12/23/2020 CLINICAL DATA:  Status post right hip hemiarthroplasty. EXAM: DG HIP (WITH OR WITHOUT PELVIS) 1V PORT RIGHT COMPARISON:  None. FINDINGS: Right hip prosthesis appears to be well situated. Expected postoperative changes are seen in the surrounding soft tissues. IMPRESSION: Status post right hip hemiarthroplasty. Electronically Signed   By: Marijo Conception M.D.   On: 12/23/2020 13:44    Scheduled Meds:  atenolol  25 mg Oral Daily   calcium carbonate  1 tablet Oral Q breakfast   chlorhexidine  1 application Topical Once   Chlorhexidine Gluconate Cloth  6 each Topical Daily   cholecalciferol  1,000 Units Oral Daily   docusate sodium  100 mg Oral BID   enoxaparin (LOVENOX) injection  30 mg Subcutaneous Q24H   feeding supplement  237 mL Oral BID BM   ferrous sulfate  325 mg Oral Q breakfast  magnesium oxide  400 mg Oral BID   multivitamin-lutein  1 capsule Oral Daily   vitamin A  10,000 Units Oral Daily   vitamin B-12  1,000 mcg Oral Daily   Continuous Infusions:   ceFAZolin (ANCEF) IV 200 mL/hr at 12/24/20 0853   clindamycin (CLEOCIN) IV 600 mg (12/24/20 1443)   methocarbamol (ROBAXIN) IV       LOS: 2 days   Time spent: 35 minutes. More than 50% of the time was spent in counseling/coordination of care  Lorella Nimrod, MD [facility]  If 7PM-7AM, please contact night-coverage [email or web address]  12/24/2020, 2:44 PM   This record has been created using Systems analyst. Errors have been sought and corrected,but may not always be located. Such creation errors do not reflect on the standard of care.

## 2020-12-24 NOTE — NC FL2 (Signed)
[place] LEVEL OF CARE SCREENING TOOL     IDENTIFICATION  Patient Name: Anne Ferrell Birthdate: 1933-03-13 Sex: female Admission Date (Current Location): 12/22/2020  [facility] and [place] Number:  Engineering geologist and Address:  [facility], [address] [place], [place] [address]      Provider Number: [id number]  Attending Physician Name and Address:  Lorella Nimrod, MD  Relative Name and Phone Number:  Carron Brazen daughter [phone]    Current Level of Care: Hospital Recommended Level of Care: [place] Prior Approval Number:    Date Approved/Denied:   PASRR Number: [id number]  Discharge Plan: SNF    Current Diagnoses: Patient Active Problem List   Diagnosis Date Noted   Closed right hip fracture ([place]) 12/22/2020   Fall at home, initial encounter 12/22/2020   Closed olecranon fracture, right, initial encounter 12/22/2020   Closed fracture distal radius and ulna, right, initial encounter 12/22/2020   Valvular heart disease 10/31/2017   Adjustment disorder with anxiety 09/16/2016   Osteoporosis 10/05/2015   Vitamin D deficiency 09/18/2015   Essential hypertension    Glaucoma    Diverticulitis     Orientation RESPIRATION BLADDER Height & Weight     Self, Time, Situation, Place  O2 (2 liters) External catheter, Continent Weight: 59 kg Height:  '5\' 9"'$  (175.3 cm)  BEHAVIORAL SYMPTOMS/MOOD NEUROLOGICAL BOWEL NUTRITION STATUS      Continent Diet ([place] Healthy)  AMBULATORY STATUS COMMUNICATION OF NEEDS Skin   Extensive Assist Verbally Normal, Surgical wounds                       Personal Care Assistance Level of Assistance  Dressing     Dressing Assistance: Limited assistance     Functional Limitations Info  Hearing   Hearing Info: Impaired      SPECIAL CARE FACTORS FREQUENCY  PT (By licensed PT), OT (By licensed OT) Blood Pressure Frequency: 5 times per week     OT Frequency: 3  times per week            Contractures Contractures Info: Not present    Additional Factors Info  Code Status, Allergies Code Status Info: full code Allergies Info: Anise Flavor, Ciprofloxacin, Levofloxacin, Licorice Flavor           Current Medications (12/24/2020):  This is the current hospital active medication list Current Facility-Administered Medications  Medication Dose Route Frequency Provider Last Rate Last Admin   acetaminophen (TYLENOL) tablet 650 mg  650 mg Oral Q6H PRN Earnestine Leys, MD       alum & mag hydroxide-simeth (MAALOX/MYLANTA) 200-200-20 MG/5ML suspension 30 mL  30 mL Oral Q4H PRN Earnestine Leys, MD       atenolol (TENORMIN) tablet 25 mg  25 mg Oral Daily Earnestine Leys, MD   25 mg at 12/24/20 1014   bisacodyl (DULCOLAX) suppository 10 mg  10 mg Rectal Daily PRN Earnestine Leys, MD       calcium carbonate (OS-CAL - dosed in mg of elemental calcium) tablet 500 mg of elemental calcium  1 tablet Oral Q breakfast Lorella Nimrod, MD       ceFAZolin (ANCEF) IVPB 2g/100 mL premix  2 g Intravenous Lajuan Lines, MD 200 mL/hr at 12/24/20 0853 Infusion Verify at 12/24/20 0853   chlorhexidine (HIBICLENS) 4 % liquid 1 application  1 application Topical Once Earnestine Leys, MD       Chlorhexidine Gluconate Cloth 2 % PADS 6  each  6 each Topical Daily Lorella Nimrod, MD   6 each at 12/24/20 1015   cholecalciferol (VITAMIN D3) tablet 1,000 Units  1,000 Units Oral Daily Earnestine Leys, MD   1,000 Units at 12/24/20 1014   clindamycin (CLEOCIN) IVPB 600 mg  600 mg Intravenous Lajuan Lines, MD   Stopped at 12/24/20 0654   docusate sodium (COLACE) capsule 100 mg  100 mg Oral BID Earnestine Leys, MD   100 mg at 12/24/20 1014   enoxaparin (LOVENOX) injection 30 mg  30 mg Subcutaneous Q24H Earnestine Leys, MD   30 mg at 12/24/20 0844   feeding supplement (ENSURE SURGERY) liquid 237 mL  237 mL Oral BID BM Lorella Nimrod, MD   237 mL at 12/24/20 1106   ferrous sulfate tablet 325  mg  325 mg Oral Q breakfast Earnestine Leys, MD   325 mg at 12/24/20 0844   hydrALAZINE (APRESOLINE) injection 5 mg  5 mg Intravenous Q2H PRN Earnestine Leys, MD       magnesium oxide (MAG-OX) tablet 400 mg  400 mg Oral BID Earnestine Leys, MD   400 mg at 12/24/20 1014   menthol-cetylpyridinium (CEPACOL) lozenge 3 mg  1 lozenge Oral PRN Earnestine Leys, MD       Or   phenol (CHLORASEPTIC) mouth spray 1 spray  1 spray Mouth/Throat PRN Earnestine Leys, MD       methocarbamol (ROBAXIN) tablet 500 mg  500 mg Oral Q6H PRN Earnestine Leys, MD       Or   methocarbamol (ROBAXIN) 500 mg in dextrose 5 % 50 mL IVPB  500 mg Intravenous Q6H PRN Earnestine Leys, MD       metoCLOPramide (REGLAN) tablet 5-10 mg  5-10 mg Oral Q8H PRN Earnestine Leys, MD       Or   metoCLOPramide (REGLAN) injection 5-10 mg  5-10 mg Intravenous Q8H PRN Earnestine Leys, MD       morphine 2 MG/ML injection 1 mg  1 mg Intravenous Q4H PRN Earnestine Leys, MD   1 mg at 12/23/20 [id number]   multivitamin-lutein (OCUVITE-LUTEIN) capsule 1 capsule  1 capsule Oral Daily Earnestine Leys, MD   1 capsule at 12/24/20 1023   ondansetron (ZOFRAN) injection 4 mg  4 mg Intravenous Q8H PRN Earnestine Leys, MD       ondansetron [facility]) tablet 4 mg  4 mg Oral Q6H PRN Earnestine Leys, MD       Or   ondansetron [facility]) injection 4 mg  4 mg Intravenous Q6H PRN Earnestine Leys, MD       oxyCODONE-acetaminophen (PERCOCET/ROXICET) 5-325 MG per tablet 1 tablet  1 tablet Oral Q4H PRN Earnestine Leys, MD   1 tablet at 12/24/20 [id number]   senna-docusate (Senokot-S) tablet 1 tablet  1 tablet Oral QHS PRN Earnestine Leys, MD       sodium phosphate (FLEET) 7-19 GM/118ML enema 1 enema  1 enema Rectal Once PRN Earnestine Leys, MD       vitamin A capsule 10,000 Units  10,000 Units Oral Daily Earnestine Leys, MD   10,000 Units at 12/24/20 1023   vitamin B-12 (CYANOCOBALAMIN) tablet 1,000 mcg  1,000 mcg Oral Daily Earnestine Leys, MD   1,000 mcg at 12/24/20 1014   zolpidem (AMBIEN) tablet 5  mg  5 mg Oral QHS PRN Earnestine Leys, MD         Discharge Medications: Please see discharge summary for a list of discharge medications.  Relevant Imaging Results:  Relevant Lab Results:  Additional Information SS# [id number]  Su Hilt, RN

## 2020-12-24 NOTE — Evaluation (Signed)
Physical Therapy Evaluation Patient Details Name: Anne Ferrell MRN: [id number] DOB: 1933/03/20 Today's Date: 12/24/2020   History of Present Illness  87 y.o. female with medical history significant of hypertension, anxiety, diverticulitis, osteoporosis, valvular heart disease, who presents with fall. Pt found to have R hip fx, R olecranon fx, and distal radius and ulna fx. Pt now s/p R hip hemiarthroplasty (posterior approach), ORIF of right olecranon fracture, and closed reduction percutaneous pinning of right distal radius and ulna fracture.  Clinical Impression  Pt seen for PT evaluation with co-tx with OT. Prior to admission pt was independent with mobility without AD. On this date, pt requires max<>total assist +2 for bed mobility & sit<>stand at EOB (only successful after 3 attempts). Once in standing, pt does attempt to advance LLE for side step to L but pt only able to slightly shift foot; pt unable to shift weight to R, nor weight bear through BUE to maintain PWB RLE well. Pt assisted back to bed 2/2 pain & fatigue. Pt would benefit from STR upon d/c to maximize independence with functional mobility & reduce fall risk prior to return home.     Follow Up Recommendations SNF;Supervision/Assistance - 24 hour    Equipment Recommendations  None recommended by PT    Recommendations for Other Services       Precautions / Restrictions Precautions Precautions: Fall;Posterior Hip Precaution Comments: traction (5lbs) in bed Restrictions Weight Bearing Restrictions: Yes RUE Weight Bearing: Weight bear through elbow only RLE Weight Bearing: Partial weight bearing      Mobility  Bed Mobility Overal bed mobility: Needs Assistance Bed Mobility: Supine to Sit;Sit to Supine     Supine to sit: Max assist;Total assist;+2 for safety/equipment;HOB elevated Sit to supine: Max assist;+2 for physical assistance        Transfers Overall transfer level: Needs assistance Equipment used:  Right platform walker Transfers: Sit to/from Stand Sit to Stand: Max assist;+2 physical assistance         General transfer comment: x 3 trials, max cuing for hand placement & sequencing; pt unable to push to standing with LUE & on 3rd attempt has hands on RW to pull to standing  Ambulation/Gait                Stairs            Wheelchair Mobility    Modified Rankin (Stroke Patients Only)       Balance Overall balance assessment: Needs assistance Sitting-balance support: No upper extremity supported;Feet supported Sitting balance-Leahy Scale: Good Sitting balance - Comments: Good balance seated EOB while performing UB dressing/grooming   Standing balance support: Bilateral upper extremity supported;During functional activity Standing balance-Leahy Scale: Zero Standing balance comment: Requires MAX A to maintain static standing balance d/t pt reporting R hip pain                             Pertinent Vitals/Pain Pain Assessment: Faces Faces Pain Scale: Hurts whole lot Pain Location: R elbow/wrist and R hip Pain Descriptors / Indicators: Aching;Discomfort;Grimacing;Guarding Pain Intervention(s): Limited activity within patient's tolerance;Monitored during session;Repositioned;Patient requesting pain meds-RN notified;RN gave pain meds during session    Home Living Family/patient expects to be discharged to:: Private residence Living Arrangements: Alone Available Help at Discharge: Family (daughter) Type of Home: House Home Access: Stairs to enter Entrance Stairs-Rails: Left Entrance Stairs-Number of Steps: 3 from garage Home Layout: Two level;Able to live on main level with bedroom/bathroom  Home Equipment: None      Prior Function Level of Independence: Independent               Hand Dominance   Dominant Hand: Right    Extremity/Trunk Assessment   Upper Extremity Assessment Upper Extremity Assessment: RUE deficits/detail RUE:  Unable to fully assess due to immobilization LUE Deficits / Details: WFL    Lower Extremity Assessment Lower Extremity Assessment: Generalized weakness (2/5 knee extension RLE in sitting)       Communication   Communication: No difficulties  Cognition Arousal/Alertness: Awake/alert Behavior During Therapy: WFL for tasks assessed/performed Overall Cognitive Status: Within Functional Limits for tasks assessed                                 General Comments: Alert and oriented to self, place, and situation. Anxious with OOB mobility, however agreeable following MOD encouragement      General Comments General comments (skin integrity, edema, etc.): Pt on 2L/min via nasal cannula, SPO2 89% or >; bleeding from posterior R elbow & nurse notified    Exercises General Exercises - Lower Extremity Long Arc Quad: AROM;Strengthening;Both;10 reps;Seated Other Exercises Other Exercises: Educated pt and daughter on weightbearing and posterior hip precautions, Pt only able to recall 2/3 posterior hip precautions at end of session   Assessment/Plan    PT Assessment Patient needs continued PT services  PT Problem List Decreased mobility;Decreased safety awareness;Decreased strength;Decreased range of motion;Decreased knowledge of precautions;Decreased activity tolerance;Cardiopulmonary status limiting activity;Decreased balance;Decreased knowledge of use of DME;Pain;Decreased skin integrity       PT Treatment Interventions DME instruction;Therapeutic activities;Modalities;Gait training;Patient/family education;Therapeutic exercise;Stair training;Wheelchair mobility training;Functional mobility training;Neuromuscular re-education;Manual techniques    PT Goals (Current goals can be found in the Care Plan section)  Acute Rehab PT Goals Patient Stated Goal: to have less pain PT Goal Formulation: With patient Time For Goal Achievement: 01/07/21 Potential to Achieve Goals: Fair     Frequency BID   Barriers to discharge Decreased caregiver support;Inaccessible home environment      Co-evaluation PT/OT/SLP Co-Evaluation/Treatment: Yes Reason for Co-Treatment: Complexity of the patient's impairments (multi-system involvement);For patient/therapist safety;To address functional/ADL transfers PT goals addressed during session: Mobility/safety with mobility;Balance;Proper use of DME;Strengthening/ROM         AM-PAC PT "6 Clicks" Mobility  Outcome Measure Help needed turning from your back to your side while in a flat bed without using bedrails?: Total Help needed moving from lying on your back to sitting on the side of a flat bed without using bedrails?: Total Help needed moving to and from a bed to a chair (including a wheelchair)?: Total Help needed standing up from a chair using your arms (e.g., wheelchair or bedside chair)?: Total Help needed to walk in hospital room?: Total Help needed climbing 3-5 steps with a railing? : Total 6 Click Score: 6    End of Session Equipment Utilized During Treatment: Gait belt Activity Tolerance: Patient limited by fatigue;Patient limited by pain Patient left: in bed;with call bell/phone within reach;with bed alarm set;with family/visitor present Nurse Communication: Mobility status (bleeding R elbow) PT Visit Diagnosis: Unsteadiness on feet (R26.81);Muscle weakness (generalized) (M62.81);Difficulty in walking, not elsewhere classified (R26.2);Pain Pain - Right/Left: Right Pain - part of body:  (elbow & hip)    Time: [id number] PT Time Calculation (min) (ACUTE ONLY): 43 min   Charges:   PT Evaluation $PT Eval High Complexity: 1 High PT Treatments $Therapeutic Activity:  8-22 mins        Lavone Nian, PT, DPT 12/24/20, 1:19 PM   Waunita Schooner 12/24/2020, 1:17 PM

## 2020-12-25 ENCOUNTER — Encounter: Payer: Self-pay | Admitting: Specialist

## 2020-12-25 LAB — BASIC METABOLIC PANEL
Anion gap: 6 (ref 5–15)
BUN: 13 mg/dL (ref 8–23)
CO2: 24 mmol/L (ref 22–32)
Calcium: 8.1 mg/dL — ABNORMAL LOW (ref 8.9–10.3)
Chloride: 102 mmol/L (ref 98–111)
Creatinine, Ser: 0.55 mg/dL (ref 0.44–1.00)
GFR, Estimated: >60 mL/min (ref >60)
Glucose, Bld: 126 mg/dL — ABNORMAL HIGH (ref 70–99)
Potassium: 4.3 mmol/L (ref 3.5–5.1)
Sodium: 132 mmol/L — ABNORMAL LOW (ref 135–145)

## 2020-12-25 LAB — CBC
HCT: 28.5 % — ABNORMAL LOW (ref 36.0–46.0)
Hemoglobin: 9.7 g/dL — ABNORMAL LOW (ref 12.0–15.0)
MCH: 32.7 pg (ref 26.0–34.0)
MCHC: 34 g/dL (ref 30.0–36.0)
MCV: 96 fL (ref 80.0–100.0)
Platelets: 140 10*3/uL — ABNORMAL LOW (ref 150–400)
RBC: 2.97 MIL/uL — ABNORMAL LOW (ref 3.87–5.11)
RDW: 12.9 % (ref 11.5–15.5)
WBC: 10 10*3/uL (ref 4.0–10.5)
nRBC: 0.2 % (ref 0.0–0.2)

## 2020-12-25 MED ORDER — ASPIRIN EC 81 MG PO TBEC
81.0000 mg | DELAYED_RELEASE_TABLET | Freq: Two times a day (BID) | ORAL | Status: DC
  Administered 2020-12-25 – 2020-12-27 (×4): 81 mg via ORAL
  Filled 2020-12-25 (×4): qty 1

## 2020-12-25 NOTE — Progress Notes (Signed)
Patient was found uncovered and diagonal in bed. Nursing went to get help with turning and repositioning and was very disoriented. She had completely removed her R leg from traction and was stating that she was trying to go bed. Patient was re-oriented multiple times.She was pushing against nursing staff when we had to turn her to do a linen change.

## 2020-12-25 NOTE — Care Management Important Message (Signed)
Important Message  Patient Details  Name: Anne Ferrell MRN: [id number] Date of Birth: [birth year]   Medicare Important Message Given:  Yes     Dannette Barbara 12/25/2020, 12:07 PM

## 2020-12-25 NOTE — Progress Notes (Signed)
Subjective: 2 Days Post-Op Procedure(s) (LRB): ARTHROPLASTY BIPOLAR HIP (HEMIARTHROPLASTY) (Right) OPEN REDUCTION INTERNAL FIXATION (ORIF) ELBOW/OLECRANON FRACTURE (Right) CLOSED REDUCTION AND PERCUTANEOUS PINNING RIGHT WRIST (Right) The patient is awake and alert.  She appears to be comfortable.  She is continue to have drainage from the incision around the elbow onto the dressings.  She has been out of bed to the chair.  I changed all the dressings on the right arm.  She did have some blood dripping.  No sign of infection.  She was redressed with more compression on the wound.  I think we will stop her Lovenox and switch her to aspirin.  She will need skilled nursing rehabilitation.  Otherwise she is doing very well.   Patient reports pain as mild.  Objective:   VITALS:   Vitals:   12/25/20 0541 12/25/20 0747  BP: (!) 142/78 131/75  Pulse: 89 84  Resp: 18 15  Temp: 99 F (37.2 C) 98.5 F (36.9 C)  SpO2: 98% 100%    Neurologically intact Incision: moderate drainage at the right elbow.  The right hip dressing is dry.  LABS Recent Labs    12/23/20 1937 12/24/20 0540 12/25/20 0510  HGB 10.7* 9.8* 9.7*  HCT 31.1* 28.1* 28.5*  WBC 11.8* 10.6* 10.0  PLT 157 132* 140*    Recent Labs    12/23/20 1937 12/24/20 0540 12/25/20 0510  NA  --  133* 132*  K  --  4.3 4.3  BUN  --  19 13  CREATININE 0.82 0.65 0.55  GLUCOSE  --  137* 126*    No results for input(s): LABPT, INR in the last 72 hours.   Assessment/Plan: 2 Days Post-Op Procedure(s) (LRB): ARTHROPLASTY BIPOLAR HIP (HEMIARTHROPLASTY) (Right) OPEN REDUCTION INTERNAL FIXATION (ORIF) ELBOW/OLECRANON FRACTURE (Right) CLOSED REDUCTION AND PERCUTANEOUS PINNING RIGHT WRIST (Right)   Advance diet Up with therapy Discharge to SNF Discontinue Lovenox and heart aspirin therapy.

## 2020-12-25 NOTE — Plan of Care (Signed)
  Problem: Education: Goal: Knowledge of General Education information will improve Description: Including pain rating scale, medication(s)/side effects and non-pharmacologic comfort measures Outcome: Progressing   Problem: Pain Managment: Goal: General experience of comfort will improve Outcome: Progressing   Problem: Safety: Goal: Ability to remain free from injury will improve Outcome: Progressing   

## 2020-12-25 NOTE — TOC Progression Note (Signed)
Transition of Care [facility]) - Progression Note    Patient Details  Name: Anne Ferrell MRN: [id number] Date of Birth: 06/27/1932  Transition of Care [facility]) CM/SW [place], RN Phone Number: 12/25/2020, 10:37 AM  Clinical Narrative:     [facility] is unable to accept the patient due to no beds and staffing, they contacted the patient's daughter and explained    CM called [facility] to request a bed and follow up on request thru [facility], left a voice mail for Butch Penny in admissions for a call back     Expected Discharge Plan and Services                                                 Social Determinants of Health (SDOH) Interventions    Readmission Risk Interventions No flowsheet data found.

## 2020-12-25 NOTE — Progress Notes (Signed)
Physical Therapy Treatment Patient Details Name: Anne Ferrell MRN: [id number] DOB: Sep 23, 1932 Today's Date: 12/25/2020    History of Present Illness 87 y.o. female with medical history significant of hypertension, anxiety, diverticulitis, osteoporosis, valvular heart disease, who presents with fall. Pt found to have R hip fx, R olecranon fx, and distal radius and ulna fx. Pt now s/p R hip hemiarthroplasty (posterior approach), ORIF of right olecranon fracture, and closed reduction percutaneous pinning of right distal radius and ulna fracture.    PT Comments    Pt seen for PT tx with PT tech for +2 assistance. Pt is still only able to recall 2/3 posterior hip precautions with PT educating her on the 3rd. Pt tolerates more RLE strengthening exercises but continues to only demonstrate 2/5 R knee extension in sitting. Pt requires max assist +1 for supine>sit but +2 assist for successful sit>stand from significantly elevated EOB. Pt is able to take a few steps towards recliner but really only shuffles BLE across floor and limits weight shifting to R (pt even with L lateral lean in sitting & standing to offload RUE & RLE). Pt fatigued after transferring to chair but pleased with getting OOB. Will continue to follow pt acutely to progress gait as able.     Follow Up Recommendations  SNF;Supervision/Assistance - 24 hour     Equipment Recommendations  None recommended by PT    Recommendations for Other Services       Precautions / Restrictions Precautions Precautions: Fall;Posterior Hip Precaution Comments: traction (5lbs) in bed Restrictions Weight Bearing Restrictions: Yes RUE Weight Bearing: Weight bear through elbow only RLE Weight Bearing: Partial weight bearing    Mobility  Bed Mobility Overal bed mobility: Needs Assistance Bed Mobility: Supine to Sit     Supine to sit: Max assist;HOB elevated     General bed mobility comments: Pt able to transfer supine>sit with HOB  elevated with max assist of only 1 person today. Pt is able to advance LLE to EOB but requires assistance to move RLE & to upright trunk. Pt with c/o R hip pain with all movement.    Transfers Overall transfer level: Needs assistance Equipment used: Right platform walker Transfers: Sit to/from Stand Sit to Stand: Mod assist;+2 physical assistance;From elevated surface         General transfer comment: Sit>stand from significantly elevated EOB with staff assisting her with RUE placement on platform & MAX multimodal cuing to push to standing with LUE vs pulling on RW with fair return demo.  Ambulation/Gait Ambulation/Gait assistance: Min assist;+2 physical assistance Gait Distance (Feet): 3 Feet Assistive device: Right platform walker Gait Pattern/deviations: Decreased step length - right;Decreased step length - left;Decreased stride length;Decreased dorsiflexion - right;Decreased dorsiflexion - left;Decreased weight shift to right Gait velocity: decreased   General Gait Details: L lateral lean, does not clear either foot from floor & instead scoots them along to L. Urinary incontinence during mobility.   Stairs             Wheelchair Mobility    Modified Rankin (Stroke Patients Only)       Balance Overall balance assessment: Needs assistance Sitting-balance support: Bilateral upper extremity supported;Feet supported Sitting balance-Leahy Scale: Good     Standing balance support: Bilateral upper extremity supported;During functional activity Standing balance-Leahy Scale: Poor Standing balance comment: min/mod assist +2 for standing with BUE support on RW               High Level Balance Comments: L Lateral lean in  standing & sitting, believe this is due to pt unweighting/shifting off of R hip/RUE            Cognition Arousal/Alertness: Awake/alert Behavior During Therapy: WFL for tasks assessed/performed;Anxious Overall Cognitive Status: Within Functional  Limits for tasks assessed                                 General Comments: HOH, follows simple commands with increased time.      Exercises General Exercises - Lower Extremity Ankle Circles/Pumps: AROM;Both;10 reps Long Arc Quad: AROM;Strengthening;Right;10 reps;Seated (continues to demonstrate 2/5 R knee extension in sitting) Heel Slides: AROM;Strengthening;Right;10 reps (long sitting in recliner)    General Comments General comments (skin integrity, edema, etc.): Pt on 2L/min via nasal cannula, SpO2 95% after transfer to recliner, pt with ongoing drainage from R elbow & nursing staff aware. Pt only able to recall 2/3 posterior hip precautions with PT educating her on 3/3      Pertinent Vitals/Pain Pain Assessment: Faces Faces Pain Scale: Hurts whole lot Pain Location: R elbow/wrist and R hip Pain Descriptors / Indicators: Aching;Discomfort;Grimacing;Guarding Pain Intervention(s): Limited activity within patient's tolerance;Monitored during session;Repositioned    Home Living                      Prior Function            PT Goals (current goals can now be found in the care plan section) Acute Rehab PT Goals Patient Stated Goal: to have less pain PT Goal Formulation: With patient Time For Goal Achievement: 01/07/21 Potential to Achieve Goals: Fair Progress towards PT goals: Progressing toward goals    Frequency    BID      PT Plan Current plan remains appropriate    Co-evaluation              AM-PAC PT "6 Clicks" Mobility   Outcome Measure  Help needed turning from your back to your side while in a flat bed without using bedrails?: Total Help needed moving from lying on your back to sitting on the side of a flat bed without using bedrails?: Total Help needed moving to and from a bed to a chair (including a wheelchair)?: Total Help needed standing up from a chair using your arms (e.g., wheelchair or bedside chair)?: Total Help  needed to walk in hospital room?: Total Help needed climbing 3-5 steps with a railing? : Total 6 Click Score: 6    End of Session Equipment Utilized During Treatment: Gait belt;Oxygen Activity Tolerance: Patient tolerated treatment well;Patient limited by fatigue;Patient limited by pain Patient left: with call bell/phone within reach;with chair alarm set;in chair Nurse Communication: Mobility status PT Visit Diagnosis: Unsteadiness on feet (R26.81);Muscle weakness (generalized) (M62.81);Difficulty in walking, not elsewhere classified (R26.2);Pain Pain - Right/Left: Right Pain - part of body:  (elbow & hip)     Time: 1030-1056 PT Time Calculation (min) (ACUTE ONLY): 26 min  Charges:  $Therapeutic Activity: 23-37 mins                     Lavone Nian, PT, DPT 12/25/20, 12:39 PM    Waunita Schooner 12/25/2020, 12:36 PM

## 2020-12-25 NOTE — Progress Notes (Signed)
PROGRESS NOTE    Anne Ferrell  [id number] DOB: [birth year] DOA: 12/22/2020 PCP: Owens Loffler, MD   Brief Narrative: Taken from H&P.  Anne Ferrell is a 87 y.o. female with medical history significant of hypertension, anxiety, diverticulitis, osteoporosis, valvular heart disease, who presents with mechanical fall.  Found to have multiple fractures which includes a displaced subcapital right hip fracture, 2 displaced comminuted right olecranon fractures and a displaced distal radius and ulna fracture. CT head and cervical spine was without any acute abnormality.  Orthopedic was consulted and she was taken to the OR for hemiarthroplasty of right hip and ORIF of right distal radius and ulna fractures.  Patient will be going to rehab in next 1 to 2 days once bed is available.  Due to recurrence of bleeding from surgical site at upper arm-orthopedic discontinued Lovenox and started on aspirin.  Subjective: Patient was seen and examined today.  Having big bulky clean bandage on the right elbow with some bleeding on the sheet.  Bandage was recently changed by orthopedic. Pain seems well controlled.  Assessment & Plan:   Principal Problem:   Closed right hip fracture [facility]) Active Problems:   Essential hypertension   Fall at home, initial encounter   Closed olecranon fracture, right, initial encounter   Closed fracture distal radius and ulna, right, initial encounter  Closed right hip fracture, closed olecranon fracture and closed fracture distal radius and ulna, right, initial encounter:  As evidenced by x-ray. S/p right Hip hemiarthroplasty, ORIF of right distal radius and ulna and wiring of displaced fracture of olecranon by orthopedic surgery today. -Continue with pain management -PT/OT evaluation-recommending SNF placement -TOC is working on it -Continue with supportive care  Essential hypertension.  Blood pressure within goal after the surgery.  Initially some softer  blood pressure. -Continue home dose of atenolol  Postsurgical anemia/acute blood loss anemia.  Most likely with blood loss with injury and surgery.  Hemoglobin at 9.8, from 10.7 before surgery. -Iron supplement -Continue to monitor  Objective: Vitals:   12/24/20 1600 12/24/20 2058 12/25/20 0541 12/25/20 0747  BP: 127/68 (!) 159/74 (!) 142/78 131/75  Pulse: 82 96 89 84  Resp: '18 16 18 15  '$ Temp: 99.6 F (37.6 C) 98.7 F (37.1 C) 99 F (37.2 C) 98.5 F (36.9 C)  TempSrc:  Oral Oral   SpO2: 97% 93% 98% 100%  Weight:      Height:        Intake/Output Summary (Last 24 hours) at 12/25/2020 1623 Last data filed at 12/25/2020 1416 Gross per 24 hour  Intake 0 ml  Output 200 ml  Net -200 ml    Filed Weights   12/22/20 0941  Weight: 59 kg    Examination:  General.  Well-developed elderly lady, in no acute distress. Pulmonary.  Lungs clear bilaterally, normal respiratory effort. CV.  Regular rate and rhythm, no JVD, rub or murmur. Abdomen.  Soft, nontender, nondistended, BS positive. CNS.  Alert and oriented x3.  No focal neurologic deficit. Extremities.  No edema, no cyanosis, pulses intact and symmetrical.  Right upper extremity with bulky dressing and Ace wrap. Psychiatry.  Judgment and insight appears normal.   DVT prophylaxis: SCDs- Code Status: Full Family Communication: Called daughter with no response. Disposition Plan:  Status is: Inpatient  Remains inpatient appropriate because:Inpatient level of care appropriate due to severity of illness  Dispo: The patient is from: Home              Anticipated d/c is  to:  To be determined              Patient currently is not medically stable to d/c.   Difficult to place patient No               Level of care: Med-Surg  All the records are reviewed and case discussed with Care Management/Social Worker. Management plans discussed with the patient, nursing and they are in agreement.  Consultants:  Orthopedic  surgery  Procedures:  Antimicrobials:   Data Reviewed: I have personally reviewed following labs and imaging studies  CBC: Recent Labs  Lab 12/22/20 1141 12/23/20 0636 12/23/20 1937 12/24/20 0540 12/25/20 0510  WBC 17.8* 10.7* 11.8* 10.6* 10.0  NEUTROABS 16.3*  --   --   --   --   HGB 12.7 12.2 10.7* 9.8* 9.7*  HCT 37.6 35.7* 31.1* 28.1* 28.5*  MCV 94.7 93.9 94.8 94.9 96.0  PLT 210 174 157 132* 140*    Basic Metabolic Panel: Recent Labs  Lab 12/22/20 1141 12/23/20 1937 12/24/20 0540 12/25/20 0510  NA 137  --  133* 132*  K 4.0  --  4.3 4.3  CL 101  --  103 102  CO2 29  --  25 24  GLUCOSE 130*  --  137* 126*  BUN 25*  --  19 13  CREATININE 0.72 0.82 0.65 0.55  CALCIUM 8.4*  --  7.7* 8.1*    GFR: Estimated Creatinine Clearance: 47 mL/min (by C-G formula based on SCr of 0.55 mg/dL). Liver Function Tests: Recent Labs  Lab 12/22/20 1141  AST 30  ALT 27  ALKPHOS 69  BILITOT 1.1  PROT 5.7*  ALBUMIN 3.7    No results for input(s): LIPASE, AMYLASE in the last 168 hours. No results for input(s): AMMONIA in the last 168 hours. Coagulation Profile: Recent Labs  Lab 12/22/20 1141  INR 1.1    Cardiac Enzymes: No results for input(s): CKTOTAL, CKMB, CKMBINDEX, TROPONINI in the last 168 hours. BNP (last 3 results) No results for input(s): PROBNP in the last 8760 hours. HbA1C: No results for input(s): HGBA1C in the last 72 hours. CBG: No results for input(s): GLUCAP in the last 168 hours. Lipid Profile: No results for input(s): CHOL, HDL, LDLCALC, TRIG, CHOLHDL, LDLDIRECT in the last 72 hours. Thyroid Function Tests: No results for input(s): TSH, T4TOTAL, FREET4, T3FREE, THYROIDAB in the last 72 hours. Anemia Panel: No results for input(s): VITAMINB12, FOLATE, FERRITIN, TIBC, IRON, RETICCTPCT in the last 72 hours. Sepsis Labs: No results for input(s): PROCALCITON, LATICACIDVEN in the last 168 hours.  Recent Results (from the past 240 hour(s))  Resp Panel  by RT-PCR (Flu A&B, Covid) Nasopharyngeal Swab     Status: None   Collection Time: 12/22/20 11:42 AM   Specimen: Nasopharyngeal Swab; Nasopharyngeal(NP) swabs in vial transport medium  Result Value Ref Range Status   SARS Coronavirus 2 by RT PCR NEGATIVE NEGATIVE Final    Comment: (NOTE) SARS-CoV-2 target nucleic acids are NOT DETECTED.  The SARS-CoV-2 RNA is generally detectable in upper respiratory specimens during the acute phase of infection. The lowest concentration of SARS-CoV-2 viral copies this assay can detect is 138 copies/mL. A negative result does not preclude SARS-Cov-2 infection and should not be used as the sole basis for treatment or other patient management decisions. A negative result may occur with  improper specimen collection/handling, submission of specimen other than nasopharyngeal swab, presence of viral mutation(s) within the areas targeted by this assay, and inadequate  number of viral copies(<138 copies/mL). A negative result must be combined with clinical observations, patient history, and epidemiological information. The expected result is Negative.  Fact Sheet for Patients:  EntrepreneurPulse.com.au  Fact Sheet for Healthcare Providers:  IncredibleEmployment.be  This test is no t yet approved or cleared by the [place] FDA and  has been authorized for detection and/or diagnosis of SARS-CoV-2 by FDA under an Emergency Use Authorization (EUA). This EUA will remain  in effect (meaning this test can be used) for the duration of the COVID-19 declaration under Section 564(b)(1) of the Act, 21 U.S.C.section 360bbb-3(b)(1), unless the authorization is terminated  or revoked sooner.       Influenza A by PCR NEGATIVE NEGATIVE Final   Influenza B by PCR NEGATIVE NEGATIVE Final    Comment: (NOTE) The Xpert Xpress SARS-CoV-2/FLU/RSV plus assay is intended as an aid in the diagnosis of influenza from Nasopharyngeal swab  specimens and should not be used as a sole basis for treatment. Nasal washings and aspirates are unacceptable for Xpert Xpress SARS-CoV-2/FLU/RSV testing.  Fact Sheet for Patients: EntrepreneurPulse.com.au  Fact Sheet for Healthcare Providers: IncredibleEmployment.be  This test is not yet approved or cleared by the [place] FDA and has been authorized for detection and/or diagnosis of SARS-CoV-2 by FDA under an Emergency Use Authorization (EUA). This EUA will remain in effect (meaning this test can be used) for the duration of the COVID-19 declaration under Section 564(b)(1) of the Act, 21 U.S.C. section 360bbb-3(b)(1), unless the authorization is terminated or revoked.  Performed at [facility], [address] [place], [place] [address]       Radiology Studies: No results found.  Scheduled Meds:  aspirin EC  81 mg Oral BID   atenolol  25 mg Oral Daily   calcium carbonate  1 tablet Oral Q breakfast   chlorhexidine  1 application Topical Once   Chlorhexidine Gluconate Cloth  6 each Topical Daily   cholecalciferol  1,000 Units Oral Daily   docusate sodium  100 mg Oral BID   feeding supplement  237 mL Oral BID BM   ferrous sulfate  325 mg Oral Q breakfast   magnesium oxide  400 mg Oral BID   multivitamin-lutein  1 capsule Oral Daily   vitamin A  10,000 Units Oral Daily   vitamin B-12  1,000 mcg Oral Daily   Continuous Infusions:  methocarbamol (ROBAXIN) IV       LOS: 3 days   Time spent: 34 minutes. More than 50% of the time was spent in counseling/coordination of care  Lorella Nimrod, MD [facility]  If 7PM-7AM, please contact night-coverage [email or web address]  12/25/2020, 4:23 PM   This record has been created using Systems analyst. Errors have been sought and corrected,but may not always be located. Such creation errors do not reflect on the standard of care.

## 2020-12-25 NOTE — Plan of Care (Signed)

## 2020-12-25 NOTE — Progress Notes (Signed)
Physical Therapy Treatment Patient Details Name: Anne Ferrell MRN: [id number] DOB: 1932/06/04 Today's Date: 12/25/2020    History of Present Illness 87 y.o. female with medical history significant of hypertension, anxiety, diverticulitis, osteoporosis, valvular heart disease, who presents with fall. Pt found to have R hip fx, R olecranon fx, and distal radius and ulna fx. Pt now s/p R hip hemiarthroplasty (posterior approach), ORIF of right olecranon fracture, and closed reduction percutaneous pinning of right distal radius and ulna fracture.    PT Comments    Pt seen for PT tx with pt on room air throughout session & lowest SpO2 89% otherwise >/= 90%. Pt requires mod assist +2 for sit>stand from low recliner chair but with some improvement in ability to push to standing. Pt is still unable to take steps forwards but is able to clear RLE when stepping to R to return to bed. Pt performs RLE strengthening exercises with cuing for technique but still remains weak & limited by pain. Continue to recommend STR upon d/c.     Follow Up Recommendations  SNF;Supervision/Assistance - 24 hour     Equipment Recommendations  None recommended by PT    Recommendations for Other Services       Precautions / Restrictions Precautions Precautions: Fall;Posterior Hip Precaution Comments: traction (5lbs) in bed Restrictions Weight Bearing Restrictions: Yes RUE Weight Bearing: Weight bear through elbow only RLE Weight Bearing: Partial weight bearing    Mobility  Bed Mobility Overal bed mobility: Needs Assistance Bed Mobility: Sit to Supine     Supine to sit: Max assist;HOB elevated Sit to supine: Max assist;+2 for physical assistance   General bed mobility comments: Pt participates in lowering trunk to bed but still requires assistance for lowering & elevating BLE onto bed, +2 dependent assist to scoot to [facility]    Transfers Overall transfer level: Needs assistance Equipment used: Right  platform walker Transfers: Sit to/from Stand Sit to Stand: Mod assist;+2 physical assistance         General transfer comment: from low recliner with cuing & fair return demo of pushing to stand with RUE  Ambulation/Gait Ambulation/Gait assistance: Mod assist Gait Distance (Feet): 3 Feet Assistive device: Right platform walker Gait Pattern/deviations: Decreased step length - right;Decreased step length - left;Decreased stride length;Decreased dorsiflexion - right;Decreased dorsiflexion - left;Decreased weight shift to right Gait velocity: decreased   General Gait Details: Cued pt for forwards gait but pt unable and instead takes steps sideways to turn to bed. Pt with ongoing difficulty weight shifting to R 2/2 pain but with improvement in clearing RLE to take actual step, however continues to slide LLE as pt unable to lift it.   Stairs             Wheelchair Mobility    Modified Rankin (Stroke Patients Only)       Balance Overall balance assessment: Needs assistance Sitting-balance support: Bilateral upper extremity supported;Feet supported Sitting balance-Leahy Scale: Good     Standing balance support: Bilateral upper extremity supported;During functional activity Standing balance-Leahy Scale: Poor Standing balance comment: min/mod assist +2 for standing with BUE support on RW               High Level Balance Comments: Pt with ongoing L lateral lean in sitting/standing to decrease weight shifting to RUE/RLE for increased comfort            Cognition Arousal/Alertness: Awake/alert Behavior During Therapy: WFL for tasks assessed/performed;Anxious Overall Cognitive Status: Within Functional Limits for tasks assessed  General Comments: HOH, follows simple commands with increased time.      Exercises General Exercises - Lower Extremity Ankle Circles/Pumps: AROM;Both;10 reps Short Arc Quad:  AAROM;Strengthening;Right;10 reps;Supine Long Arc Quad: AROM;Strengthening;Right;10 reps;Seated (continues to demonstrate 2/5 R knee extension in sitting) Heel Slides: AROM;Strengthening;Right;10 reps (long sitting in recliner) Hip ABduction/ADduction: AAROM;Strengthening;Right;10 reps;Supine (hip abduction slides)    General Comments General comments (skin integrity, edema, etc.): Pt with continued drainage from R elbow. Pt on room air with SpO2 89% or greater during session, improves with cuing for breathing.      Pertinent Vitals/Pain Pain Assessment: Faces Faces Pain Scale: Hurts whole lot Pain Location: R elbow/wrist and R hip Pain Descriptors / Indicators: Aching;Discomfort;Grimacing;Guarding Pain Intervention(s): Limited activity within patient's tolerance;Monitored during session;Repositioned    Home Living                      Prior Function            PT Goals (current goals can now be found in the care plan section) Acute Rehab PT Goals Patient Stated Goal: to have less pain PT Goal Formulation: With patient Time For Goal Achievement: 01/07/21 Potential to Achieve Goals: Fair Progress towards PT goals: Progressing toward goals    Frequency    BID      PT Plan Current plan remains appropriate    Co-evaluation              AM-PAC PT "6 Clicks" Mobility   Outcome Measure  Help needed turning from your back to your side while in a flat bed without using bedrails?: Total Help needed moving from lying on your back to sitting on the side of a flat bed without using bedrails?: Total Help needed moving to and from a bed to a chair (including a wheelchair)?: Total Help needed standing up from a chair using your arms (e.g., wheelchair or bedside chair)?: Total Help needed to walk in hospital room?: Total Help needed climbing 3-5 steps with a railing? : Total 6 Click Score: 6    End of Session Equipment Utilized During Treatment: Gait belt Activity  Tolerance: Patient tolerated treatment well;Patient limited by fatigue;Patient limited by pain Patient left: in bed;with call bell/phone within reach;with bed alarm set (traction on RLE) Nurse Communication: Mobility status PT Visit Diagnosis: Unsteadiness on feet (R26.81);Muscle weakness (generalized) (M62.81);Difficulty in walking, not elsewhere classified (R26.2);Pain Pain - Right/Left: Right Pain - part of body:  (hip & elbow)     Time: [id number] PT Time Calculation (min) (ACUTE ONLY): 25 min  Charges:  $Therapeutic Activity: 23-37 mins                     Lavone Nian, PT, DPT 12/25/20, 3:45 PM    Waunita Schooner 12/25/2020, 3:43 PM

## 2020-12-26 LAB — CBC
HCT: 26.8 % — ABNORMAL LOW (ref 36.0–46.0)
Hemoglobin: 9.4 g/dL — ABNORMAL LOW (ref 12.0–15.0)
MCH: 32.8 pg (ref 26.0–34.0)
MCHC: 35.1 g/dL (ref 30.0–36.0)
MCV: 93.4 fL (ref 80.0–100.0)
Platelets: 159 10*3/uL (ref 150–400)
RBC: 2.87 MIL/uL — ABNORMAL LOW (ref 3.87–5.11)
RDW: 12.7 % (ref 11.5–15.5)
WBC: 9.7 10*3/uL (ref 4.0–10.5)
nRBC: 0 % (ref 0.0–0.2)

## 2020-12-26 LAB — SURGICAL PATHOLOGY

## 2020-12-26 NOTE — Plan of Care (Signed)
Pt expressed discomfort in bladder. Bladder scan revealed 818m. Order placed for in and out cath with output of 9032m No other acute events this shift. Problem: Education: Goal: Knowledge of General Education information will improve Description: Including pain rating scale, medication(s)/side effects and non-pharmacologic comfort measures Outcome: Progressing   Problem: Pain Managment: Goal: General experience of comfort will improve Outcome: Progressing   Problem: Safety: Goal: Ability to remain free from injury will improve Outcome: Progressing   Problem: Skin Integrity: Goal: Risk for impaired skin integrity will decrease Outcome: Progressing

## 2020-12-26 NOTE — TOC Progression Note (Signed)
Transition of Care [facility]) - Progression Note    Patient Details  Name: Anne Ferrell MRN: [id number] Date of Birth: [birth year]  Transition of Care [facility]) CM/SW [place], RN Phone Number: 12/26/2020, 2:40 PM  Clinical Narrative:    [facility] called and stated they will have a bed for tomorrow the patient will discharge tomorrow to [facility] in [place], They will need DC summary by noon        Expected Discharge Plan and Services                                                 Social Determinants of Health (SDOH) Interventions    Readmission Risk Interventions No flowsheet data found.

## 2020-12-26 NOTE — Progress Notes (Signed)
Physical Therapy Treatment Patient Details Name: Anne Ferrell MRN: [id number] DOB: 09-Mar-1933 Today's Date: 12/26/2020    History of Present Illness 87 y.o. female with medical history significant of hypertension, anxiety, diverticulitis, osteoporosis, valvular heart disease, who presents with fall. Pt found to have R hip fx, R olecranon fx, and distal radius and ulna fx. Pt now s/p R hip hemiarthroplasty (posterior approach), ORIF of right olecranon fracture, and closed reduction percutaneous pinning of right distal radius and ulna fracture.    PT Comments    Pt was long sitting in bed upon arriving. She was premedicated prior to session and cooperative throughout. Pt did not know posterior hip precautions but was able to adhere to precautions/restrictions throughout. She was able to exit L side of bed, stand to platform RW and take a few steps to recliner. Max vcs throughout for technique, sequencing, and safety. Overall pt is progressing but slowly. Highly recommend DC to SNF to address deficits while maximizing independence with ADLs + assisting pt to PLOF.   Follow Up Recommendations  SNF;Supervision/Assistance - 24 hour     Equipment Recommendations  None recommended by PT       Precautions / Restrictions Precautions Precautions: Fall;Posterior Hip Precaution Comments: traction (5lbs) in bed Restrictions Weight Bearing Restrictions: Yes RUE Weight Bearing: Weight bear through elbow only RLE Weight Bearing: Partial weight bearing RLE Partial Weight Bearing Percentage or Pounds: 50    Mobility  Bed Mobility Overal bed mobility: Needs Assistance Bed Mobility: Supine to Sit     Supine to sit: Max assist;HOB elevated     General bed mobility comments: Max assist to progress from supine to short sit with max vcs and increased time to perform    Transfers Overall transfer level: Needs assistance Equipment used: Right platform walker Transfers: Sit to/from Stand Sit to  Stand: From elevated surface;Mod assist         General transfer comment: Mod assist to stand from elevated bed height. Vcs for handplacement and improved technique while adhering to proper posterior precautions and PWB  Ambulation/Gait Ambulation/Gait assistance: Mod assist Gait Distance (Feet): 3 Feet Assistive device: Right platform walker Gait Pattern/deviations: Decreased step length - right;Decreased step length - left;Decreased stride length;Decreased dorsiflexion - right;Decreased dorsiflexion - left;Decreased weight shift to right Gait velocity: decreased   General Gait Details: pt was able to progress from EOB to recliner with increased time and vcs for technique and safety    Balance Overall balance assessment: Needs assistance Sitting-balance support: Bilateral upper extremity supported;Feet supported Sitting balance-Leahy Scale: Good     Standing balance support: Bilateral upper extremity supported;During functional activity Standing balance-Leahy Scale: Fair Standing balance comment: needs min assist for safety to prevent LOB      Cognition Arousal/Alertness: Awake/alert Behavior During Therapy: WFL for tasks assessed/performed Overall Cognitive Status: Within Functional Limits for tasks assessed      General Comments: Pt is A and O x 4. pleasant and motivated however slow moving due to pain             Pertinent Vitals/Pain Pain Assessment: 0-10 Pain Score: 7  Faces Pain Scale: Hurts even more Pain Location: R elbow/wrist and R hip Pain Descriptors / Indicators: Aching;Discomfort;Grimacing;Guarding Pain Intervention(s): Limited activity within patient's tolerance;Monitored during session;Premedicated before session;Repositioned;Ice applied     PT Goals (current goals can now be found in the care plan section) Acute Rehab PT Goals Patient Stated Goal: to have less pain Progress towards PT goals: Progressing toward goals  Frequency    BID       PT Plan Current plan remains appropriate    Co-evaluation     PT goals addressed during session: Mobility/safety with mobility;Balance;Proper use of DME;Strengthening/ROM        AM-PAC PT "6 Clicks" Mobility   Outcome Measure  Help needed turning from your back to your side while in a flat bed without using bedrails?: A Lot Help needed moving from lying on your back to sitting on the side of a flat bed without using bedrails?: A Lot Help needed moving to and from a bed to a chair (including a wheelchair)?: A Lot Help needed standing up from a chair using your arms (e.g., wheelchair or bedside chair)?: A Lot Help needed to walk in hospital room?: A Lot Help needed climbing 3-5 steps with a railing? : Total 6 Click Score: 11    End of Session Equipment Utilized During Treatment: Gait belt Activity Tolerance: Patient tolerated treatment well Patient left: in chair;with call bell/phone within reach;with chair alarm set;with family/visitor present Nurse Communication: Mobility status PT Visit Diagnosis: Unsteadiness on feet (R26.81);Muscle weakness (generalized) (M62.81);Difficulty in walking, not elsewhere classified (R26.2);Pain Pain - Right/Left: Right     Time: 1104-1130 PT Time Calculation (min) (ACUTE ONLY): 26 min  Charges:  $Therapeutic Activity: 23-37 mins                     Julaine Fusi PTA 12/26/20, 4:06 PM

## 2020-12-26 NOTE — Progress Notes (Signed)
PROGRESS NOTE    Anne Ferrell  [id number] DOB: Mar 29, 1933 DOA: 12/22/2020 PCP: Owens Loffler, MD   Brief Narrative: Taken from H&P.  Anne Ferrell is a 87 y.o. female with medical history significant of hypertension, anxiety, diverticulitis, osteoporosis, valvular heart disease, who presents with mechanical fall.  Found to have multiple fractures which includes a displaced subcapital right hip fracture, 2 displaced comminuted right olecranon fractures and a displaced distal radius and ulna fracture. CT head and cervical spine was without any acute abnormality.  Orthopedic was consulted and she was taken to the OR for hemiarthroplasty of right hip and ORIF of right distal radius and ulna fractures.  Patient will be going to rehab, most likely tomorrow.  Due to recurrence of bleeding from surgical site at upper arm-orthopedic discontinued Lovenox and started on aspirin.  Subjective: Patient was resting comfortably in bed when seen today.  No new complaints.  Stating that no pain while she is resting, experiencing some pain with movements.  Waiting for insurance authorization.  Assessment & Plan:   Principal Problem:   Closed right hip fracture [facility]) Active Problems:   Essential hypertension   Fall at home, initial encounter   Closed olecranon fracture, right, initial encounter   Closed fracture distal radius and ulna, right, initial encounter  Closed right hip fracture, closed olecranon fracture and closed fracture distal radius and ulna, right, initial encounter:  As evidenced by x-ray. S/p right Hip hemiarthroplasty, ORIF of right distal radius and ulna and wiring of displaced fracture of olecranon by orthopedic surgery today. -Continue with pain management -PT/OT evaluation-recommending SNF placement, most likely be leaving tomorrow. -Continue with supportive care  Essential hypertension.  Blood pressure within goal after the surgery.  Initially some softer blood  pressure. -Continue home dose of atenolol  Postsurgical anemia/acute blood loss anemia.  Most likely with blood loss with injury and surgery.  Hemoglobin at 9.4, from 10.7 before surgery. -Iron supplement -Continue to monitor  Objective: Vitals:   12/25/20 0747 12/25/20 1946 12/26/20 0456 12/26/20 0955  BP: 131/75 (!) 159/82 (!) 155/77 126/62  Pulse: 84 97 (!) 102 99  Resp: '15 18 18 16  '$ Temp: 98.5 F (36.9 C) 100 F (37.8 C) 98.5 F (36.9 C) 98.5 F (36.9 C)  TempSrc:      SpO2: 100% 92% 94% 94%  Weight:      Height:        Intake/Output Summary (Last 24 hours) at 12/26/2020 1508 Last data filed at 12/26/2020 0541 Gross per 24 hour  Intake --  Output 900 ml  Net -900 ml    Filed Weights   12/22/20 0941  Weight: 59 kg    Examination:  General.  Pleasant elderly lady, in no acute distress. Pulmonary.  Lungs clear bilaterally, normal respiratory effort. CV.  Regular rate and rhythm, no JVD, rub or murmur. Abdomen.  Soft, nontender, nondistended, BS positive. CNS.  Alert and oriented .  No focal neurologic deficit. Extremities.  No edema, no cyanosis, pulses intact and symmetrical.  Right upper extremity with Ace wrap. Psychiatry.  Judgment and insight appears normal.   DVT prophylaxis: SCDs- Code Status: Full Family Communication: Called daughter with no response. Disposition Plan:  Status is: Inpatient  Remains inpatient appropriate because:Inpatient level of care appropriate due to severity of illness  Dispo: The patient is from: Home              Anticipated d/c is to: SNF  Patient currently is not medically stable to d/c.   Difficult to place patient No               Level of care: Med-Surg  All the records are reviewed and case discussed with Care Management/Social Worker. Management plans discussed with the patient, nursing and they are in agreement.  Consultants:  Orthopedic surgery  Procedures:  Antimicrobials:   Data Reviewed: I  have personally reviewed following labs and imaging studies  CBC: Recent Labs  Lab 12/22/20 1141 12/23/20 0636 12/23/20 1937 12/24/20 0540 12/25/20 0510 12/26/20 0559  WBC 17.8* 10.7* 11.8* 10.6* 10.0 9.7  NEUTROABS 16.3*  --   --   --   --   --   HGB 12.7 12.2 10.7* 9.8* 9.7* 9.4*  HCT 37.6 35.7* 31.1* 28.1* 28.5* 26.8*  MCV 94.7 93.9 94.8 94.9 96.0 93.4  PLT 210 174 157 132* 140* [id number]    Basic Metabolic Panel: Recent Labs  Lab 12/22/20 1141 12/23/20 1937 12/24/20 0540 12/25/20 0510  NA 137  --  133* 132*  K 4.0  --  4.3 4.3  CL 101  --  103 102  CO2 29  --  25 24  GLUCOSE 130*  --  137* 126*  BUN 25*  --  19 13  CREATININE 0.72 0.82 0.65 0.55  CALCIUM 8.4*  --  7.7* 8.1*    GFR: Estimated Creatinine Clearance: 47 mL/min (by C-G formula based on SCr of 0.55 mg/dL). Liver Function Tests: Recent Labs  Lab 12/22/20 1141  AST 30  ALT 27  ALKPHOS 69  BILITOT 1.1  PROT 5.7*  ALBUMIN 3.7    No results for input(s): LIPASE, AMYLASE in the last 168 hours. No results for input(s): AMMONIA in the last 168 hours. Coagulation Profile: Recent Labs  Lab 12/22/20 1141  INR 1.1    Cardiac Enzymes: No results for input(s): CKTOTAL, CKMB, CKMBINDEX, TROPONINI in the last 168 hours. BNP (last 3 results) No results for input(s): PROBNP in the last 8760 hours. HbA1C: No results for input(s): HGBA1C in the last 72 hours. CBG: No results for input(s): GLUCAP in the last 168 hours. Lipid Profile: No results for input(s): CHOL, HDL, LDLCALC, TRIG, CHOLHDL, LDLDIRECT in the last 72 hours. Thyroid Function Tests: No results for input(s): TSH, T4TOTAL, FREET4, T3FREE, THYROIDAB in the last 72 hours. Anemia Panel: No results for input(s): VITAMINB12, FOLATE, FERRITIN, TIBC, IRON, RETICCTPCT in the last 72 hours. Sepsis Labs: No results for input(s): PROCALCITON, LATICACIDVEN in the last 168 hours.  Recent Results (from the past 240 hour(s))  Resp Panel by RT-PCR (Flu  A&B, Covid) Nasopharyngeal Swab     Status: None   Collection Time: 12/22/20 11:42 AM   Specimen: Nasopharyngeal Swab; Nasopharyngeal(NP) swabs in vial transport medium  Result Value Ref Range Status   SARS Coronavirus 2 by RT PCR NEGATIVE NEGATIVE Final    Comment: (NOTE) SARS-CoV-2 target nucleic acids are NOT DETECTED.  The SARS-CoV-2 RNA is generally detectable in upper respiratory specimens during the acute phase of infection. The lowest concentration of SARS-CoV-2 viral copies this assay can detect is 138 copies/mL. A negative result does not preclude SARS-Cov-2 infection and should not be used as the sole basis for treatment or other patient management decisions. A negative result may occur with  improper specimen collection/handling, submission of specimen other than nasopharyngeal swab, presence of viral mutation(s) within the areas targeted by this assay, and inadequate number of viral copies(<138 copies/mL). A negative result  must be combined with clinical observations, patient history, and epidemiological information. The expected result is Negative.  Fact Sheet for Patients:  EntrepreneurPulse.com.au  Fact Sheet for Healthcare Providers:  IncredibleEmployment.be  This test is no t yet approved or cleared by the [place] FDA and  has been authorized for detection and/or diagnosis of SARS-CoV-2 by FDA under an Emergency Use Authorization (EUA). This EUA will remain  in effect (meaning this test can be used) for the duration of the COVID-19 declaration under Section 564(b)(1) of the Act, 21 U.S.C.section 360bbb-3(b)(1), unless the authorization is terminated  or revoked sooner.       Influenza A by PCR NEGATIVE NEGATIVE Final   Influenza B by PCR NEGATIVE NEGATIVE Final    Comment: (NOTE) The Xpert Xpress SARS-CoV-2/FLU/RSV plus assay is intended as an aid in the diagnosis of influenza from Nasopharyngeal swab specimens  and should not be used as a sole basis for treatment. Nasal washings and aspirates are unacceptable for Xpert Xpress SARS-CoV-2/FLU/RSV testing.  Fact Sheet for Patients: EntrepreneurPulse.com.au  Fact Sheet for Healthcare Providers: IncredibleEmployment.be  This test is not yet approved or cleared by the [place] FDA and has been authorized for detection and/or diagnosis of SARS-CoV-2 by FDA under an Emergency Use Authorization (EUA). This EUA will remain in effect (meaning this test can be used) for the duration of the COVID-19 declaration under Section 564(b)(1) of the Act, 21 U.S.C. section 360bbb-3(b)(1), unless the authorization is terminated or revoked.  Performed at [facility], [address], [place], [place] [address]       Radiology Studies: No results found.  Scheduled Meds:  aspirin EC  81 mg Oral BID   atenolol  25 mg Oral Daily   calcium carbonate  1 tablet Oral Q breakfast   chlorhexidine  1 application Topical Once   Chlorhexidine Gluconate Cloth  6 each Topical Daily   cholecalciferol  1,000 Units Oral Daily   docusate sodium  100 mg Oral BID   feeding supplement  237 mL Oral BID BM   ferrous sulfate  325 mg Oral Q breakfast   magnesium oxide  400 mg Oral BID   multivitamin-lutein  1 capsule Oral Daily   vitamin A  10,000 Units Oral Daily   vitamin B-12  1,000 mcg Oral Daily   Continuous Infusions:  methocarbamol (ROBAXIN) IV       LOS: 4 days   Time spent: 30 minutes. More than 50% of the time was spent in counseling/coordination of care  Lorella Nimrod, MD [facility]  If 7PM-7AM, please contact night-coverage [email or web address]  12/26/2020, 3:08 PM   This record has been created using Systems analyst. Errors have been sought and corrected,but may not always be located. Such creation errors do not reflect on the standard of care.

## 2020-12-26 NOTE — TOC Transition Note (Signed)
Transition of Care [facility]) - CM/SW Discharge Note   Patient Details  Name: Anne Ferrell MRN: [id number] Date of Birth: 24-Apr-1933  Transition of Care [facility]) CM/SW Contact:  Su Hilt, RN Phone Number: 12/26/2020, 1:20 PM   Clinical Narrative:   The patient's family requested that I check with [facility] for a bed, I reached out to both facilities, [facility] is an [facility] does not have a bed, I notified the family that they would need to choose from the options that I provided for them or the other option would be to hire a caregiver for home to go home with Home health, awaiting a responce          Patient Goals and CMS Choice        Discharge Placement                       Discharge Plan and Services                                     Social Determinants of Health (SDOH) Interventions     Readmission Risk Interventions No flowsheet data found.

## 2020-12-26 NOTE — Progress Notes (Signed)
Physical Therapy Treatment Patient Details Name: Anne Ferrell MRN: [id number] DOB: 29-Jan-1933 Today's Date: 12/26/2020    History of Present Illness 87 y.o. female with medical history significant of hypertension, anxiety, diverticulitis, osteoporosis, valvular heart disease, who presents with fall. Pt found to have R hip fx, R olecranon fx, and distal radius and ulna fx. Pt now s/p R hip hemiarthroplasty (posterior approach), ORIF of right olecranon fracture, and closed reduction percutaneous pinning of right distal radius and ulna fracture.    PT Comments    Pt was sitting in recliner from earlier session upon arriving,. She agrees to session and is cooperative and motivated. "I'm ready to gt back into bed, I'm hurting." Pt did require slightly more assistance to stand from lower recliner height due to wt bearing/hip precautions. She stood to platform and took several steps to EOB. Overall pt tolerated well. Performed there ex handout once returned to bed. AAROM mostly. Highly recommend DC to SNF to address deficits.    Follow Up Recommendations  SNF;Supervision/Assistance - 24 hour     Equipment Recommendations  None recommended by PT       Precautions / Restrictions Precautions Precautions: Fall;Posterior Hip Precaution Booklet Issued: Yes (comment) Precaution Comments: traction (5lbs) in bed Restrictions Weight Bearing Restrictions: Yes RUE Weight Bearing: Weight bear through elbow only RLE Weight Bearing: Partial weight bearing RLE Partial Weight Bearing Percentage or Pounds: 50    Mobility  Bed Mobility Overal bed mobility: Needs Assistance Bed Mobility: Sit to Supine     Supine to sit: Max assist;HOB elevated Sit to supine: Max assist   General bed mobility comments: max assist for transition from short sit to supine. Assisted pt while constant vcs for adhering to precautions    Transfers Overall transfer level: Needs assistance Equipment used: Right platform  walker Transfers: Sit to/from Stand Sit to Stand: Mod assist;Max assist         General transfer comment: Increased assistance from lower surface heights with vcs for handplacement and adhering to precautions  Ambulation/Gait Ambulation/Gait assistance: Mod assist Gait Distance (Feet): 3 Feet Assistive device: Right platform walker Gait Pattern/deviations: Decreased step length - right;Decreased step length - left;Decreased stride length;Decreased dorsiflexion - right;Decreased dorsiflexion - left;Decreased weight shift to right Gait velocity: decreased   General Gait Details: pt was able to progress form recliner to EOB with increased time and Vcs for technique and sequencing.   Balance Overall balance assessment: Needs assistance Sitting-balance support: Bilateral upper extremity supported;Feet supported Sitting balance-Leahy Scale: Good Sitting balance - Comments: Good balance seated EOB while performing UB dressing/grooming   Standing balance support: Bilateral upper extremity supported;During functional activity Standing balance-Leahy Scale: Fair Standing balance comment: needs min assist for safety to prevent LOB      Cognition Arousal/Alertness: Awake/alert Behavior During Therapy: WFL for tasks assessed/performed Overall Cognitive Status: Within Functional Limits for tasks assessed      General Comments: Pt is A and O x 4. pleasant and motivated however slow moving due to pain      Exercises General Exercises - Lower Extremity Ankle Circles/Pumps: AROM;Both;10 reps Quad Sets: AROM;10 reps Gluteal Sets: AROM;10 reps Long Arc Quad: Strengthening;Right;10 reps;Seated;AAROM Heel Slides: Strengthening;Right;10 reps;AAROM Hip ABduction/ADduction: AAROM;10 reps Straight Leg Raises: AAROM;10 reps (through minimal ROM)        Pertinent Vitals/Pain Pain Assessment: 0-10 Pain Score: 4  Faces Pain Scale: Hurts little more Pain Location: R elbow/wrist and R hip Pain  Descriptors / Indicators: Aching;Discomfort;Grimacing;Guarding Pain Intervention(s): Limited activity within patient's  tolerance;Monitored during session;Premedicated before session;Repositioned;Ice applied     PT Goals (current goals can now be found in the care plan section) Acute Rehab PT Goals Patient Stated Goal: move better Progress towards PT goals: Progressing toward goals    Frequency    BID      PT Plan Current plan remains appropriate    Co-evaluation     PT goals addressed during session: Mobility/safety with mobility;Balance;Proper use of DME;Strengthening/ROM        AM-PAC PT "6 Clicks" Mobility   Outcome Measure  Help needed turning from your back to your side while in a flat bed without using bedrails?: A Lot Help needed moving from lying on your back to sitting on the side of a flat bed without using bedrails?: A Lot Help needed moving to and from a bed to a chair (including a wheelchair)?: A Lot Help needed standing up from a chair using your arms (e.g., wheelchair or bedside chair)?: A Lot Help needed to walk in hospital room?: A Lot Help needed climbing 3-5 steps with a railing? : Total 6 Click Score: 11    End of Session Equipment Utilized During Treatment: Gait belt Activity Tolerance: Patient tolerated treatment well Patient left: in bed;with call bell/phone within reach;with bed alarm set;with family/visitor present Nurse Communication: Mobility status PT Visit Diagnosis: Unsteadiness on feet (R26.81);Muscle weakness (generalized) (M62.81);Difficulty in walking, not elsewhere classified (R26.2);Pain Pain - Right/Left: Right     Time: 1350-1408 PT Time Calculation (min) (ACUTE ONLY): 18 min  Charges:  $Therapeutic Activity: 8-22 mins                     Julaine Fusi PTA 12/26/20, 4:26 PM

## 2020-12-26 NOTE — TOC Progression Note (Addendum)
Transition of Care [facility]) - Progression Note    Patient Details  Name: Anne Ferrell MRN: [id number] Date of Birth: 01-14-1933  Transition [facility] Care [facility]) CM/SW [place], RN Phone Number: 12/26/2020, 1:28 PM  Clinical Narrative:     Spoke with the family and reviewed bed options again, they chose [facility], I contacted [facility] admissions director and left a voice mail accepting the bed offer, also accepted in the Hub, awaiting a bed number and number to call report to  [facility] contacted me back and stated they do not have a bed Until Thursday or Friday,  [facility] called back aqnd requested a fax for the referral, I fxed the referral to [phone]    Expected Discharge Plan and Services                                                 Social Determinants of Health (SDOH) Interventions    Readmission Risk Interventions No flowsheet data found.

## 2020-12-26 NOTE — Progress Notes (Signed)
Occupational Therapy Treatment Patient Details Name: Daylen Sherk MRN: [id number] DOB: [birth year] Today's Date: 12/26/2020    History of present illness 87 y.o. female with medical history significant of hypertension, anxiety, diverticulitis, osteoporosis, valvular heart disease, who presents with fall. Pt found to have R hip fx, R olecranon fx, and distal radius and ulna fx. Pt now s/p R hip hemiarthroplasty (posterior approach), ORIF of right olecranon fracture, and closed reduction percutaneous pinning of right distal radius and ulna fracture.   OT comments  Pt seen this date for OT tx session with focus on lower body self care tasks with hip precautions.   Instructed on use of reacher with non dominant left hand to pick up items from various positions, doff socks and technique to don underwear/pants.  Pt remains limited by pain this date and therefore transfers were limited after she reports some dizziness after taking pain medication.  Pt progressing towards goals and would continue to benefit from skilled OT intervention to maximize safety and independence in necessary daily tasks.  Continue to recommend SNF.      Follow Up Recommendations  SNF    Equipment Recommendations       Recommendations for Other Services      Precautions / Restrictions Precautions Precautions: Fall;Posterior Hip Precaution Comments: traction (5lbs) in bed Restrictions Weight Bearing Restrictions: Yes RUE Weight Bearing: Weight bear through elbow only RLE Weight Bearing: Partial weight bearing RLE Partial Weight Bearing Percentage or Pounds: 50       Mobility Bed Mobility Overal bed mobility: Needs Assistance Bed Mobility: Supine to Sit     Supine to sit: Max assist;HOB elevated     General bed mobility comments: Max assist to progress from supine to short sit with max vcs and increased time to perform    Transfers Overall transfer level: Needs assistance Equipment used: Right platform  walker Transfers: Sit to/from Stand Sit to Stand: Mod assist         General transfer comment: Mod assist to stand from elevated bed height. Vcs for handplacement and improved technique while adhering to proper posterior precautions and PWB    Balance Overall balance assessment: Needs assistance Sitting-balance support: Bilateral upper extremity supported;Feet supported Sitting balance-Leahy Scale: Good Sitting balance - Comments: Good balance seated EOB while performing UB dressing/grooming   Standing balance support: Bilateral upper extremity supported;During functional activity Standing balance-Leahy Scale: Fair Standing balance comment: needs min assist for safety to prevent LOB                           ADL either performed or assessed with clinical judgement   ADL Overall ADL's : Needs assistance/impaired                     Lower Body Dressing: Moderate assistance Lower Body Dressing Details (indicate cue type and reason): Pt instructed on use of reacher for lower body dressing skills to doff socks, don underwear with mod assist and following hip precautions.  Able to state 1/3 precautions this date.                     Vision       Perception     Praxis      Cognition Arousal/Alertness: Awake/alert Behavior During Therapy: WFL for tasks assessed/performed Overall Cognitive Status: Within Functional Limits for tasks assessed  General Comments: Pt is A and O x 4. pleasant and motivated however slow moving due to pain        Exercises Exercises: General Lower Extremity;Other exercises (issued HEP however pt to fatigue to perform afterstanding and gait trials)   Shoulder Instructions       General Comments      Pertinent Vitals/ Pain       Pain Assessment: 0-10 Pain Score: 4  Faces Pain Scale: Hurts even more Pain Location: R elbow/wrist and R hip Pain Descriptors / Indicators:  Aching;Discomfort;Grimacing;Guarding Pain Intervention(s): Monitored during session;Repositioned;Limited activity within patient's tolerance  Home Living                                          Prior Functioning/Environment              Frequency  Min 2X/week        Progress Toward Goals  OT Goals(current goals can now be found in the care plan section)  Progress towards OT goals: Progressing toward goals  Acute Rehab OT Goals Patient Stated Goal: to have less pain OT Goal Formulation: With patient Time For Goal Achievement: 01/07/21 Potential to Achieve Goals: [place] Discharge plan remains appropriate    Co-evaluation        PT goals addressed during session: Mobility/safety with mobility;Balance;Proper use of DME;Strengthening/ROM        AM-PAC OT "6 Clicks" Daily Activity     Outcome Measure   Help from another person eating meals?: A Little Help from another person taking care of personal grooming?: A Little Help from another person toileting, which includes using toliet, bedpan, or urinal?: A Lot Help from another person bathing (including washing, rinsing, drying)?: A Lot Help from another person to put on and taking off regular upper body clothing?: A Lot Help from another person to put on and taking off regular lower body clothing?: A Lot 6 Click Score: 14    End of Session Equipment Utilized During Treatment: Gait belt;Other (comment)  OT Visit Diagnosis: Unsteadiness on feet (R26.81);Muscle weakness (generalized) (M62.81);History of falling (Z91.81);Pain Pain - Right/Left: Right Pain - part of body: Arm;Hip   Activity Tolerance Patient limited by pain   Patient Left with call bell/phone within reach;with family/visitor present;in chair   Nurse Communication          Time: 1150-1210 OT Time Calculation (min): 20 min  Charges: OT General Charges $OT Visit: 1 Visit OT Treatments $Self Care/Home Management : 8-22  mins  Saamiya Jeppsen T Turki Tapanes, OTR/L, CLT    Artie Takayama 12/26/2020, 4:09 PM

## 2020-12-26 NOTE — Progress Notes (Signed)
Subjective: 3 Days Post-Op Procedure(s) (LRB): ARTHROPLASTY BIPOLAR HIP (HEMIARTHROPLASTY) (Right) OPEN REDUCTION INTERNAL FIXATION (ORIF) ELBOW/OLECRANON FRACTURE (Right) CLOSED REDUCTION AND PERCUTANEOUS PINNING RIGHT WRIST (Right)   Patient is alert and comfortable this morning.  Fully oriented.  Dressings are dry today.  Neurovascular status is good.  Hemoglobin is stable.  Patient reports pain as mild.  Objective:   VITALS:   Vitals:   12/25/20 1946 12/26/20 0456  BP: (!) 159/82 (!) 155/77  Pulse: 97 (!) 102  Resp: 18 18  Temp: 100 F (37.8 C) 98.5 F (36.9 C)  SpO2: 92% 94%    Neurologically intact  LABS Recent Labs    12/24/20 0540 12/25/20 0510 12/26/20 0559  HGB 9.8* 9.7* 9.4*  HCT 28.1* 28.5* 26.8*  WBC 10.6* 10.0 9.7  PLT 132* 140* 159    Recent Labs    12/23/20 1937 12/24/20 0540 12/25/20 0510  NA  --  133* 132*  K  --  4.3 4.3  BUN  --  19 13  CREATININE 0.82 0.65 0.55  GLUCOSE  --  137* 126*    No results for input(s): LABPT, INR in the last 72 hours.   Assessment/Plan: 3 Days Post-Op Procedure(s) (LRB): ARTHROPLASTY BIPOLAR HIP (HEMIARTHROPLASTY) (Right) OPEN REDUCTION INTERNAL FIXATION (ORIF) ELBOW/OLECRANON FRACTURE (Right) CLOSED REDUCTION AND PERCUTANEOUS PINNING RIGHT WRIST (Right)   Advance diet Up with therapy D/C IV fluids Discharge to SNF  Enteric-coated aspirin twice daily on discharge  Partial weightbearing right leg with platform walker for the right arm  Return to clinic 2 weeks for exam and x-rays.

## 2020-12-26 NOTE — Plan of Care (Signed)
  Problem: Skin Integrity: Goal: Risk for impaired skin integrity will decrease Outcome: Progressing   Problem: Pain Managment: Goal: General experience of comfort will improve Outcome: Progressing   Problem: Safety: Goal: Ability to remain free from injury will improve Outcome: Progressing   

## 2020-12-26 NOTE — TOC Progression Note (Addendum)
Transition of Care [facility]) - Progression Note    Patient Details  Name: Anne Ferrell MRN: [id number] Date of Birth: [birth year]  [facility]) CM/SW Elias-Fela Solis, RN Phone Number: 12/26/2020, 10:45 AM  Clinical Narrative:      Sent message with a list of bed offers to the family at their request, I explained that I need a choice ASAP as she is medically stable to DC, They responded with a request to get back to me this afternoon, I explained I realize this is a big decision however the patient is medically clear for DC and insurance does not continue to cover a hospital stay once medically clear, they will get back with me early afternoon    Expected Discharge Plan and Services                                                 Social Determinants of Health (SDOH) Interventions    Readmission Risk Interventions No flowsheet data found.

## 2020-12-26 NOTE — TOC Progression Note (Signed)
Transition of Care [facility]) - Progression Note    Patient Details  Name: Deloma Wactor MRN: [id number] Date of Birth: 06-29-1932  Transition of Care [facility]) CM/SW [place], RN Phone Number: 12/26/2020, 8:49 AM  Clinical Narrative:    Darden Dates and left a voice mail for Junie Panning in marketing, the daughter of the patient sent me a message late yesterday stating they really want [facility] for STR as they were not able to get into [facility].  Sent the search to other facilities in [place]        Expected Discharge Plan and Services                                                 Social Determinants of Health (SDOH) Interventions    Readmission Risk Interventions No flowsheet data found.

## 2020-12-27 DIAGNOSIS — R262 Difficulty in walking, not elsewhere classified: Secondary | ICD-10-CM | POA: Diagnosis not present

## 2020-12-27 DIAGNOSIS — S52531A Colles' fracture of right radius, initial encounter for closed fracture: Secondary | ICD-10-CM | POA: Diagnosis not present

## 2020-12-27 DIAGNOSIS — R339 Retention of urine, unspecified: Secondary | ICD-10-CM | POA: Diagnosis not present

## 2020-12-27 DIAGNOSIS — S72009D Fracture of unspecified part of neck of unspecified femur, subsequent encounter for closed fracture with routine healing: Secondary | ICD-10-CM | POA: Diagnosis not present

## 2020-12-27 DIAGNOSIS — R41841 Cognitive communication deficit: Secondary | ICD-10-CM | POA: Diagnosis not present

## 2020-12-27 DIAGNOSIS — I10 Essential (primary) hypertension: Secondary | ICD-10-CM | POA: Diagnosis not present

## 2020-12-27 DIAGNOSIS — S52033D Displaced fracture of olecranon process with intraarticular extension of unspecified ulna, subsequent encounter for closed fracture with routine healing: Secondary | ICD-10-CM | POA: Diagnosis not present

## 2020-12-27 DIAGNOSIS — S72011D Unspecified intracapsular fracture of right femur, subsequent encounter for closed fracture with routine healing: Secondary | ICD-10-CM | POA: Diagnosis not present

## 2020-12-27 DIAGNOSIS — S52021A Displaced fracture of olecranon process without intraarticular extension of right ulna, initial encounter for closed fracture: Secondary | ICD-10-CM | POA: Diagnosis not present

## 2020-12-27 DIAGNOSIS — S52023D Displaced fracture of olecranon process without intraarticular extension of unspecified ulna, subsequent encounter for closed fracture with routine healing: Secondary | ICD-10-CM | POA: Diagnosis not present

## 2020-12-27 DIAGNOSIS — S52501D Unspecified fracture of the lower end of right radius, subsequent encounter for closed fracture with routine healing: Secondary | ICD-10-CM | POA: Diagnosis not present

## 2020-12-27 DIAGNOSIS — N390 Urinary tract infection, site not specified: Secondary | ICD-10-CM | POA: Diagnosis not present

## 2020-12-27 DIAGNOSIS — F419 Anxiety disorder, unspecified: Secondary | ICD-10-CM | POA: Diagnosis not present

## 2020-12-27 DIAGNOSIS — S72031A Displaced midcervical fracture of right femur, initial encounter for closed fracture: Secondary | ICD-10-CM | POA: Diagnosis not present

## 2020-12-27 DIAGNOSIS — M6281 Muscle weakness (generalized): Secondary | ICD-10-CM | POA: Diagnosis not present

## 2020-12-27 DIAGNOSIS — S52601D Unspecified fracture of lower end of right ulna, subsequent encounter for closed fracture with routine healing: Secondary | ICD-10-CM | POA: Diagnosis not present

## 2020-12-27 DIAGNOSIS — R279 Unspecified lack of coordination: Secondary | ICD-10-CM | POA: Diagnosis not present

## 2020-12-27 DIAGNOSIS — Z96641 Presence of right artificial hip joint: Secondary | ICD-10-CM | POA: Diagnosis not present

## 2020-12-27 DIAGNOSIS — S72001A Fracture of unspecified part of neck of right femur, initial encounter for closed fracture: Secondary | ICD-10-CM | POA: Diagnosis not present

## 2020-12-27 DIAGNOSIS — Z4889 Encounter for other specified surgical aftercare: Secondary | ICD-10-CM | POA: Diagnosis not present

## 2020-12-27 DIAGNOSIS — R5381 Other malaise: Secondary | ICD-10-CM | POA: Diagnosis not present

## 2020-12-27 DIAGNOSIS — S5290XD Unspecified fracture of unspecified forearm, subsequent encounter for closed fracture with routine healing: Secondary | ICD-10-CM | POA: Diagnosis not present

## 2020-12-27 DIAGNOSIS — E611 Iron deficiency: Secondary | ICD-10-CM | POA: Diagnosis not present

## 2020-12-27 LAB — SARS CORONAVIRUS 2 (TAT 6-24 HRS): SARS Coronavirus 2: NEGATIVE

## 2020-12-27 MED ORDER — ASPIRIN 81 MG PO TBEC: 81.0000 mg | DELAYED_RELEASE_TABLET | Freq: Two times a day (BID) | ORAL | Status: AC

## 2020-12-27 MED ORDER — COVID-19 MRNA VACC (MODERNA) 50 MCG/0.25ML IM SUSP
0.2500 mL | Freq: Once | INTRAMUSCULAR | Status: AC
  Administered 2020-12-27: 0.25 mL via INTRAMUSCULAR
  Filled 2020-12-27: qty 0.25

## 2020-12-27 MED ORDER — FERROUS SULFATE 325 (65 FE) MG PO TABS: 325.0000 mg | ORAL_TABLET | Freq: Every day | ORAL | Status: DC

## 2020-12-27 MED ORDER — ENSURE SURGERY PO LIQD: 237.0000 mL | Freq: Two times a day (BID) | ORAL | Status: DC

## 2020-12-27 MED ORDER — DOCUSATE SODIUM 100 MG PO CAPS: 100.0000 mg | ORAL_CAPSULE | Freq: Two times a day (BID) | ORAL | 0 refills | Status: DC

## 2020-12-27 MED ORDER — METHOCARBAMOL 500 MG PO TABS: 500.0000 mg | ORAL_TABLET | Freq: Four times a day (QID) | ORAL | Status: DC | PRN

## 2020-12-27 MED ORDER — CALCIUM CARBONATE 1250 (500 CA) MG PO TABS: 1.0000 | ORAL_TABLET | Freq: Every day | ORAL | Status: DC

## 2020-12-27 NOTE — Progress Notes (Signed)
Physical Therapy Treatment Patient Details Name: Anne Ferrell MRN: [id number] DOB: 04-23-1933 Today's Date: 12/27/2020    History of Present Illness 87 y.o. female with medical history significant of hypertension, anxiety, diverticulitis, osteoporosis, valvular heart disease, who presents with fall. Pt found to have R hip fx, R olecranon fx, and distal radius and ulna fx. Pt now s/p R hip hemiarthroplasty (posterior approach), ORIF of right olecranon fracture, and closed reduction percutaneous pinning of right distal radius and ulna fracture.    PT Comments    Pt was long sitting in bed with ice packs to hip and R shoulder. She is A and O x 4. Very motivated. Continues to require max assist to exit bed. Stood to platform walker and was able to take several steps to recliner. Slow antalgic steps but improved gait quality from previous date. Re-issued HEP handout and pt performed several. Pot was in recliner post session with call bell in reach and ice packs reapplied.recommend DC to SNF to address deficits prior to returning home.   Follow Up Recommendations  SNF;Supervision/Assistance - 24 hour     Equipment Recommendations  None recommended by PT       Precautions / Restrictions Precautions Precautions: Fall;Posterior Hip Precaution Booklet Issued: Yes (comment) Precaution Comments: traction (5lbs) in bed Restrictions Weight Bearing Restrictions: Yes RUE Weight Bearing: Weight bear through elbow only RLE Weight Bearing: Partial weight bearing RLE Partial Weight Bearing Percentage or Pounds: 50    Mobility  Bed Mobility Overal bed mobility: Needs Assistance Bed Mobility: Supine to Sit       Sit to supine: Max assist   General bed mobility comments: max assist to exit left side of bed with increased time and vsc for adhering to posterior precautions and technique    Transfers Overall transfer level: Needs assistance Equipment used: Right platform walker Transfers: Sit  to/from Stand Sit to Stand: Min assist;From elevated surface         General transfer comment: Min assist to stand from elevated bed height  Ambulation/Gait Ambulation/Gait assistance: Min assist Gait Distance (Feet): 3 Feet Assistive device: Right platform walker Gait Pattern/deviations: Decreased step length - right;Decreased step length - left;Decreased stride length;Decreased dorsiflexion - right;Decreased dorsiflexion - left;Decreased weight shift to right;Step-to pattern Gait velocity: decreased   General Gait Details: pt was able to take improved steps to recliner    Balance Overall balance assessment: Needs assistance Sitting-balance support: Bilateral upper extremity supported;Feet supported Sitting balance-Leahy Scale: Good     Standing balance support: Bilateral upper extremity supported;During functional activity Standing balance-Leahy Scale: Fair Standing balance comment: needs min assist for safety to prevent LOB      Cognition Arousal/Alertness: Awake/alert Behavior During Therapy: WFL for tasks assessed/performed Overall Cognitive Status: Within Functional Limits for tasks assessed    General Comments: Pt is A and O x 4. Pleasant and motivated however slow moving due to pain             Pertinent Vitals/Pain Pain Assessment: 0-10 Pain Score: 6  Faces Pain Scale: Hurts little more (with movements) Pain Location: R elbow/wrist and R hip Pain Descriptors / Indicators: Aching;Discomfort;Grimacing;Guarding Pain Intervention(s): Limited activity within patient's tolerance;Monitored during session;Premedicated before session;Repositioned;Ice applied     PT Goals (current goals can now be found in the care plan section) Acute Rehab PT Goals Patient Stated Goal: move better Progress towards PT goals: Progressing toward goals    Frequency    BID      PT Plan Current plan  remains appropriate    Co-evaluation     PT goals addressed during session:  Mobility/safety with mobility;Balance;Proper use of DME;Strengthening/ROM        AM-PAC PT "6 Clicks" Mobility   Outcome Measure  Help needed turning from your back to your side while in a flat bed without using bedrails?: A Lot Help needed moving from lying on your back to sitting on the side of a flat bed without using bedrails?: A Lot Help needed moving to and from a bed to a chair (including a wheelchair)?: A Lot Help needed standing up from a chair using your arms (e.g., wheelchair or bedside chair)?: A Lot Help needed to walk in hospital room?: A Lot Help needed climbing 3-5 steps with a railing? : A Lot 6 Click Score: 12    End of Session Equipment Utilized During Treatment: Gait belt Activity Tolerance: Patient tolerated treatment well Patient left: with call bell/phone within reach;with family/visitor present;in chair;with chair alarm set Nurse Communication: Mobility status PT Visit Diagnosis: Unsteadiness on feet (R26.81);Muscle weakness (generalized) (M62.81);Difficulty in walking, not elsewhere classified (R26.2);Pain Pain - Right/Left: Right Pain - part of body: Shoulder;Hip     Time: [id number] PT Time Calculation (min) (ACUTE ONLY): 25 min  Charges:  $Therapeutic Activity: 23-37 mins                     Julaine Fusi PTA 12/27/20, 9:33 AM

## 2020-12-27 NOTE — Progress Notes (Signed)
Report given to [facility] rehab - Scientific laboratory technician.

## 2020-12-27 NOTE — Discharge Summary (Addendum)
Physician Discharge Summary   Anne Ferrell  female DOB: Oct 24, 1932  LE:[id number]  PCP: Owens Loffler, MD  Admit date: 12/22/2020 Discharge date: 12/27/2020  Admitted From: home Disposition:  SNF CODE STATUS: Full code  Discharge Instructions     Discharge instructions   Complete by: As directed    Partial weightbearing right leg with platform walker for the right arm   Return to clinic 2 weeks for exam and x-rays. - -   No wound care   Complete by: As directed         Hospital Course:  For full details, please see H&P, progress notes, consult notes and ancillary notes.  Briefly,  Anne Ferrell is a 87 y.o. female with medical history significant of hypertension, anxiety, diverticulitis, osteoporosis, valvular heart disease, who presented with mechanical fall.   Found to have multiple fractures which includes a displaced subcapital right hip fracture, 2 displaced comminuted right olecranon fractures and a displaced distal radius and ulna fracture.  CT head and cervical spine was without any acute abnormality.   Orthopedic was consulted and she was taken to the OR for hemiarthroplasty of right hip and ORIF of right distal radius and ulna fractures.   Due to recurrence of bleeding from surgical site at upper arm-orthopedic discontinued Lovenox and started on aspirin.  Closed right hip fracture, closed olecranon fracture and closed fracture distal radius and ulna, right, initial encounter:   S/p right Hip hemiarthroplasty, ORIF of right distal radius and ulna and wiring of displaced fracture of olecranon by orthopedic surgery Dr. Sabra Heck. --pain mild prior to discharge. --Enteric-coated aspirin twice daily on discharge for 21 days  --Partial weightbearing right leg with platform walker for the right arm --Return to ortho clinic 2 weeks for exam and x-rays.   Essential hypertension.   Blood pressure within goal after the surgery.  Initially some softer blood  pressure. -Continue home dose of atenolol   Postsurgical anemia/acute blood loss anemia.   Most likely with blood loss with injury and surgery.  Hgb stable in 9's. -Iron supplement  Post-op urinary retention --Foley initially removed on 8/28, however, pt developed urinary retention, and Foley was re-inserted on 8/30.   --Please perform voiding trial again about 1 week after discharge (around 01/01/21).  If still retaining, then will need to see outpatient urology.   Discharge Diagnoses:  Principal Problem:   Closed right hip fracture [facility]) Active Problems:   Essential hypertension   Fall at home, initial encounter   Closed olecranon fracture, right, initial encounter   Closed fracture distal radius and ulna, right, initial encounter   30 Day Unplanned Readmission Risk Score    Flowsheet Row ED to Hosp-Admission (Current) from 12/22/2020 in [facility] (1A)  30 Day Unplanned Readmission Risk Score (%) 12.92 Filed at 12/27/2020 0801       This score is the patient's risk of an unplanned readmission within 30 days of being discharged (0 -100%). The score is based on dignosis, age, lab data, medications, orders, and past utilization.   Low:  0-14.9   Medium: 15-21.9   High: 22-29.9   Extreme: 30 and above         Discharge Instructions:  Allergies as of 12/27/2020       Reactions   Anise Flavor Other (See Comments)   Feels like she is going to pass out   Ciprofloxacin    Levofloxacin    Licorice Flavor [flavoring Agent] Palpitations  Medication List     TAKE these medications    aspirin 81 MG EC tablet Take 1 tablet (81 mg total) by mouth 2 (two) times daily for 21 days. Swallow whole.   atenolol 25 MG tablet Commonly known as: TENORMIN Take 1 tablet by mouth once daily   calcium carbonate 1250 (500 Ca) MG tablet Commonly known as: OS-CAL - dosed in mg of elemental calcium Take 1 tablet (500 mg of elemental calcium total)  by mouth daily with breakfast. Start taking on: December 28, 2020   docusate sodium 100 MG capsule Commonly known as: COLACE Take 1 capsule (100 mg total) by mouth 2 (two) times daily.   feeding supplement Liqd Take 237 mLs by mouth 2 (two) times daily between meals.   ferrous sulfate 325 (65 FE) MG tablet Take 1 tablet (325 mg total) by mouth daily with breakfast. Start taking on: December 28, 2020   Magnesium 250 MG Tabs Take 2 tablets by mouth daily.   methocarbamol 500 MG tablet Commonly known as: ROBAXIN Take 1 tablet (500 mg total) by mouth every 6 (six) hours as needed for muscle spasms.   PreserVision AREDS Caps Take 1 capsule by mouth daily.   VITAMIN A PO Take 2 tablets by mouth daily. 10,000 units   vitamin B-12 1000 MCG tablet Commonly known as: CYANOCOBALAMIN Take 1,000 mcg by mouth daily.   Vitamin D (Cholecalciferol) 25 MCG (1000 UT) Tabs Take 1 tablet by mouth daily.         Follow-up Information     Earnestine Leys, MD Follow up in 2 week(s).   Specialty: Orthopedic Surgery Why: Please make office appointment prior to the patient's discharge. Contact information: [place] [address] [phone]         Owens Loffler, MD Follow up.   Specialties: Family Medicine, Sports Medicine Contact information: [place] [address] [phone]                 Allergies  Allergen Reactions   Anise Flavor Other (See Comments)    Feels like she is going to pass out   Ciprofloxacin    Levofloxacin    Licorice Flavor [Flavoring Agent] Palpitations     The results of significant diagnostics from this hospitalization (including imaging, microbiology, ancillary and laboratory) are listed below for reference.   Consultations:   Procedures/Studies: DG Elbow 2 Views Right  Result Date: 12/23/2020 CLINICAL DATA:  Status post surgical internal fixation of olecranon fracture. EXAM: RIGHT  ELBOW - 2 VIEW COMPARISON:  December 22, 2020. FINDINGS: The right elbow has been splinted and immobilized. The patient is status post open reduction and surgical internal fixation of olecranon fracture. Good alignment of fracture components is noted. IMPRESSION: Status post surgical internal fixation of olecranon fracture. Electronically Signed   By: Marijo Conception M.D.   On: 12/23/2020 13:47   DG Elbow 2 Views Right  Result Date: 12/22/2020 CLINICAL DATA:  Fall with pain. EXAM: RIGHT ELBOW - 2 VIEW COMPARISON:  None. FINDINGS: Displaced and comminuted fracture of the olecranon. Normal alignment of the radial head with the capitellum. Extensive soft tissue swelling in the right elbow with a joint effusion. IMPRESSION: Comminuted and displaced fracture of the olecranon. Electronically Signed   By: Markus Daft M.D.   On: 12/22/2020 13:10   DG Wrist 2 Views Right  Result Date: 12/23/2020 CLINICAL DATA:  Status post surgical internal fixation of radial fracture. EXAM: RIGHT  WRIST - 2 VIEW COMPARISON:  December 22, 2020. FINDINGS: The right wrist has been splinted and immobilized. 2 surgical pins are seen fixating comminuted and displaced distal right radial fracture with stable alignment of fracture components. Continued mildly displaced right ulnar fracture is noted. IMPRESSION: Status post surgical internal fixation of distal right radial fracture. Electronically Signed   By: Marijo Conception M.D.   On: 12/23/2020 13:46   DG Wrist Complete Right  Result Date: 12/22/2020 CLINICAL DATA:  Right wrist pain after fall. EXAM: RIGHT WRIST - COMPLETE 3+ VIEW COMPARISON:  None. FINDINGS: Moderately displaced and comminuted fractures are seen involving the distal right radius and ulna. Moderate osteoarthritis is seen involving the first carpometacarpal joint. IMPRESSION: Moderately displaced and comminuted distal right radial and ulnar fractures. Electronically Signed   By: Marijo Conception M.D.   On: 12/22/2020 11:02    CT HEAD WO CONTRAST (5MM)  Result Date: 12/22/2020 CLINICAL DATA:  Head trauma, minor (Age >= 65y); Neck trauma (Age >= 65y) EXAM: CT HEAD WITHOUT CONTRAST CT CERVICAL SPINE WITHOUT CONTRAST TECHNIQUE: Multidetector CT imaging of the head and cervical spine was performed following the standard protocol without intravenous contrast. Multiplanar CT image reconstructions of the cervical spine were also generated. COMPARISON:  None. FINDINGS: CT HEAD FINDINGS Brain: No evidence of acute large vascular territory infarction, hemorrhage, hydrocephalus, extra-axial collection or mass lesion/mass effect. Mild to moderate patchy white matter hypoattenuation, likely related to chronic microvascular ischemic disease. Generalized mild to moderate atrophy with ex vacuo ventricular dilation. Streak artifact limits evaluation of the posterior fossa. Vascular: No hyperdense vessel identified. Calcific intracranial atherosclerosis. Skull: No acute fracture. Sinuses/Orbits: Clear visualized sinuses. No acute orbital findings. Other: No mastoid effusions. CT CERVICAL SPINE FINDINGS Alignment: Straightening of the normal cervical lordosis. Mild anterolisthesis of C5 on C6, likely degenerative given degenerative changes at this level. Mild dextrocurvature. Skull base and vertebrae: No evidence of acute fracture. Nonspecific discrete lucent lesion involving the posterior elements at C2. Soft tissues and spinal canal: No prevertebral fluid or swelling. No visible canal hematoma. Disc levels: Craniocervical degenerative change. Multilevel degenerative disc disease, greatest and moderate to severe on the left at C6-C7 where there is disc height loss, endplate sclerosis and endplate spurring. Likely at least mild to moderate canal stenosis at this level. Multilevel bulky left facet arthropathy, greatest and severe on the left at C5-C6 where there is osseous fusion across the facet joint. Ossified posterior ligamentum flavum on the left  at T1-T2. Upper chest: Biapical pleuroparenchymal scarring. Otherwise, visualized lung apices are clear. The thyroid is partially imaged with multiple subcentimeter thyroid nodules. Not clinically significant; no follow-up imaging recommended (ref: J Am Coll Radiol. 2015 Feb;12(2): 143-50). Other: Nonspecific small sclerotic lesion in the posterior left rib. IMPRESSION: CT head: 1. No evidence of acute intracranial abnormality. 2. Mild to moderate chronic microvascular ischemic disease and atrophy. CT cervical spine: 1. No evidence of acute fracture or traumatic malalignment. 2. Multilevel moderate to severe degenerative change with likely at least mild to moderate canal stenosis at C6-C7 and bulky multilevel left facet hypertrophy. An MRI of the cervical spine could further evaluate the canal and foramina if clinically indicated. 3. Discrete lucent lesion involving the posterior elements at C2 and small sclerotic lesion involving the posterior left second rib, nonspecific but most likely benign in the absence of a known primary malignancy. Electronically Signed   By: Margaretha Sheffield M.D.   On: 12/22/2020 11:24   CT Cervical Spine Wo Contrast  Result Date: 12/22/2020 CLINICAL DATA:  Head trauma, minor (Age >= 65y); Neck trauma (Age >= 65y) EXAM: CT HEAD WITHOUT CONTRAST CT CERVICAL SPINE WITHOUT CONTRAST TECHNIQUE: Multidetector CT imaging of the head and cervical spine was performed following the standard protocol without intravenous contrast. Multiplanar CT image reconstructions of the cervical spine were also generated. COMPARISON:  None. FINDINGS: CT HEAD FINDINGS Brain: No evidence of acute large vascular territory infarction, hemorrhage, hydrocephalus, extra-axial collection or mass lesion/mass effect. Mild to moderate patchy white matter hypoattenuation, likely related to chronic microvascular ischemic disease. Generalized mild to moderate atrophy with ex vacuo ventricular dilation. Streak artifact  limits evaluation of the posterior fossa. Vascular: No hyperdense vessel identified. Calcific intracranial atherosclerosis. Skull: No acute fracture. Sinuses/Orbits: Clear visualized sinuses. No acute orbital findings. Other: No mastoid effusions. CT CERVICAL SPINE FINDINGS Alignment: Straightening of the normal cervical lordosis. Mild anterolisthesis of C5 on C6, likely degenerative given degenerative changes at this level. Mild dextrocurvature. Skull base and vertebrae: No evidence of acute fracture. Nonspecific discrete lucent lesion involving the posterior elements at C2. Soft tissues and spinal canal: No prevertebral fluid or swelling. No visible canal hematoma. Disc levels: Craniocervical degenerative change. Multilevel degenerative disc disease, greatest and moderate to severe on the left at C6-C7 where there is disc height loss, endplate sclerosis and endplate spurring. Likely at least mild to moderate canal stenosis at this level. Multilevel bulky left facet arthropathy, greatest and severe on the left at C5-C6 where there is osseous fusion across the facet joint. Ossified posterior ligamentum flavum on the left at T1-T2. Upper chest: Biapical pleuroparenchymal scarring. Otherwise, visualized lung apices are clear. The thyroid is partially imaged with multiple subcentimeter thyroid nodules. Not clinically significant; no follow-up imaging recommended (ref: J Am Coll Radiol. 2015 Feb;12(2): 143-50). Other: Nonspecific small sclerotic lesion in the posterior left rib. IMPRESSION: CT head: 1. No evidence of acute intracranial abnormality. 2. Mild to moderate chronic microvascular ischemic disease and atrophy. CT cervical spine: 1. No evidence of acute fracture or traumatic malalignment. 2. Multilevel moderate to severe degenerative change with likely at least mild to moderate canal stenosis at C6-C7 and bulky multilevel left facet hypertrophy. An MRI of the cervical spine could further evaluate the canal and  foramina if clinically indicated. 3. Discrete lucent lesion involving the posterior elements at C2 and small sclerotic lesion involving the posterior left second rib, nonspecific but most likely benign in the absence of a known primary malignancy. Electronically Signed   By: Margaretha Sheffield M.D.   On: 12/22/2020 11:24   DG Chest Portable 1 View  Result Date: 12/22/2020 CLINICAL DATA:  Hip fracture.  Preop. EXAM: PORTABLE CHEST 1 VIEW COMPARISON:  02/16/2015 FINDINGS: Chronic coarse lung markings. Few densities at the left lung base are suggestive for atelectasis. Otherwise, the lungs are clear. Negative for a pneumothorax. Heart and mediastinum are within normal limits. IMPRESSION: Probable left basilar atelectasis. Otherwise, no acute chest findings. Electronically Signed   By: Markus Daft M.D.   On: 12/22/2020 13:07   DG MINI C-ARM IMAGE ONLY  Result Date: 12/23/2020 There is no interpretation for this exam.  This order is for images obtained during a surgical procedure.  Please See "Surgeries" Tab for more information regarding the procedure.   DG HIP UNILAT WITH PELVIS 1V RIGHT  Result Date: 12/22/2020 CLINICAL DATA:  Fall, fracture EXAM: DG HIP (WITH OR WITHOUT PELVIS) 1V RIGHT COMPARISON:  Same day hip radiograph FINDINGS: Single cross-table lateral view of the right hip  demonstrates a mildly displaced subcapital femoral neck fracture. IMPRESSION: Mildly displaced subcapital femoral neck fracture. Electronically Signed   By: Maurine Simmering M.D.   On: 12/22/2020 13:11   DG Hip Port Unilat With Pelvis 1V Right  Result Date: 12/23/2020 CLINICAL DATA:  Status post right hip hemiarthroplasty. EXAM: DG HIP (WITH OR WITHOUT PELVIS) 1V PORT RIGHT COMPARISON:  None. FINDINGS: Right hip prosthesis appears to be well situated. Expected postoperative changes are seen in the surrounding soft tissues. IMPRESSION: Status post right hip hemiarthroplasty. Electronically Signed   By: Marijo Conception M.D.   On:  12/23/2020 13:44   DG Hip Unilat W or Wo Pelvis 2-3 Views Right  Result Date: 12/22/2020 CLINICAL DATA:  Right hip pain after fall. EXAM: DG HIP (WITH OR WITHOUT PELVIS) 2-3V RIGHT COMPARISON:  None. FINDINGS: Moderately displaced proximal right femoral neck fracture is noted. IMPRESSION: Moderately displaced proximal right femoral neck fracture. Electronically Signed   By: Marijo Conception M.D.   On: 12/22/2020 11:00      Labs: BNP (last 3 results) No results for input(s): BNP in the last 8760 hours. Basic Metabolic Panel: Recent Labs  Lab 12/22/20 1141 12/23/20 1937 12/24/20 0540 12/25/20 0510  NA 137  --  133* 132*  K 4.0  --  4.3 4.3  CL 101  --  103 102  CO2 29  --  25 24  GLUCOSE 130*  --  137* 126*  BUN 25*  --  19 13  CREATININE 0.72 0.82 0.65 0.55  CALCIUM 8.4*  --  7.7* 8.1*   Liver Function Tests: Recent Labs  Lab 12/22/20 1141  AST 30  ALT 27  ALKPHOS 69  BILITOT 1.1  PROT 5.7*  ALBUMIN 3.7   No results for input(s): LIPASE, AMYLASE in the last 168 hours. No results for input(s): AMMONIA in the last 168 hours. CBC: Recent Labs  Lab 12/22/20 1141 12/23/20 0636 12/23/20 1937 12/24/20 0540 12/25/20 0510 12/26/20 0559  WBC 17.8* 10.7* 11.8* 10.6* 10.0 9.7  NEUTROABS 16.3*  --   --   --   --   --   HGB 12.7 12.2 10.7* 9.8* 9.7* 9.4*  HCT 37.6 35.7* 31.1* 28.1* 28.5* 26.8*  MCV 94.7 93.9 94.8 94.9 96.0 93.4  PLT 210 174 157 132* 140* 159   Cardiac Enzymes: No results for input(s): CKTOTAL, CKMB, CKMBINDEX, TROPONINI in the last 168 hours. BNP: Invalid input(s): POCBNP CBG: No results for input(s): GLUCAP in the last 168 hours. D-Dimer No results for input(s): DDIMER in the last 72 hours. Hgb A1c No results for input(s): HGBA1C in the last 72 hours. Lipid Profile No results for input(s): CHOL, HDL, LDLCALC, TRIG, CHOLHDL, LDLDIRECT in the last 72 hours. Thyroid function studies No results for input(s): TSH, T4TOTAL, T3FREE, THYROIDAB in the  last 72 hours.  Invalid input(s): FREET3 Anemia work up No results for input(s): VITAMINB12, FOLATE, FERRITIN, TIBC, IRON, RETICCTPCT in the last 72 hours. Urinalysis    Component Value Date/Time   COLORURINE YELLOW (A) 02/24/2015 2123   APPEARANCEUR CLEAR (A) 02/24/2015 2123   LABSPEC 1.024 02/24/2015 2123   PHURINE 5.0 02/24/2015 2123   GLUCOSEU NEGATIVE 02/24/2015 2123   HGBUR NEGATIVE 02/24/2015 2123   BILIRUBINUR NEGATIVE 02/24/2015 2123   KETONESUR TRACE (A) 02/24/2015 2123   PROTEINUR NEGATIVE 02/24/2015 2123   NITRITE NEGATIVE 02/24/2015 2123   LEUKOCYTESUR NEGATIVE 02/24/2015 2123   Sepsis Labs Invalid input(s): PROCALCITONIN,  WBC,  LACTICIDVEN Microbiology Recent Results (from  the past 240 hour(s))  Resp Panel by RT-PCR (Flu A&B, Covid) Nasopharyngeal Swab     Status: None   Collection Time: 12/22/20 11:42 AM   Specimen: Nasopharyngeal Swab; Nasopharyngeal(NP) swabs in vial transport medium  Result Value Ref Range Status   SARS Coronavirus 2 by RT PCR NEGATIVE NEGATIVE Final    Comment: (NOTE) SARS-CoV-2 target nucleic acids are NOT DETECTED.  The SARS-CoV-2 RNA is generally detectable in upper respiratory specimens during the acute phase of infection. The lowest concentration of SARS-CoV-2 viral copies this assay can detect is 138 copies/mL. A negative result does not preclude SARS-Cov-2 infection and should not be used as the sole basis for treatment or other patient management decisions. A negative result may occur with  improper specimen collection/handling, submission of specimen other than nasopharyngeal swab, presence of viral mutation(s) within the areas targeted by this assay, and inadequate number of viral copies(<138 copies/mL). A negative result must be combined with clinical observations, patient history, and epidemiological information. The expected result is Negative.  Fact Sheet for Patients:  EntrepreneurPulse.com.au  Fact  Sheet for Healthcare Providers:  IncredibleEmployment.be  This test is no t yet approved or cleared by the [place] FDA and  has been authorized for detection and/or diagnosis of SARS-CoV-2 by FDA under an Emergency Use Authorization (EUA). This EUA will remain  in effect (meaning this test can be used) for the duration of the COVID-19 declaration under Section 564(b)(1) of the Act, 21 U.S.C.section 360bbb-3(b)(1), unless the authorization is terminated  or revoked sooner.       Influenza A by PCR NEGATIVE NEGATIVE Final   Influenza B by PCR NEGATIVE NEGATIVE Final    Comment: (NOTE) The Xpert Xpress SARS-CoV-2/FLU/RSV plus assay is intended as an aid in the diagnosis of influenza from Nasopharyngeal swab specimens and should not be used as a sole basis for treatment. Nasal washings and aspirates are unacceptable for Xpert Xpress SARS-CoV-2/FLU/RSV testing.  Fact Sheet for Patients: EntrepreneurPulse.com.au  Fact Sheet for Healthcare Providers: IncredibleEmployment.be  This test is not yet approved or cleared by the [place] FDA and has been authorized for detection and/or diagnosis of SARS-CoV-2 by FDA under an Emergency Use Authorization (EUA). This EUA will remain in effect (meaning this test can be used) for the duration of the COVID-19 declaration under Section 564(b)(1) of the Act, 21 U.S.C. section 360bbb-3(b)(1), unless the authorization is terminated or revoked.  Performed at [facility], [place], [place] [address]   SARS CORONAVIRUS 2 (TAT 6-24 HRS) Nasopharyngeal Nasopharyngeal Swab     Status: None   Collection Time: 12/26/20  6:39 PM   Specimen: Nasopharyngeal Swab  Result Value Ref Range Status   SARS Coronavirus 2 NEGATIVE NEGATIVE Final    Comment: (NOTE) SARS-CoV-2 target nucleic acids are NOT DETECTED.  The SARS-CoV-2 RNA is generally detectable in upper and  lower respiratory specimens during the acute phase of infection. Negative results do not preclude SARS-CoV-2 infection, do not rule out co-infections with other pathogens, and should not be used as the sole basis for treatment or other patient management decisions. Negative results must be combined with clinical observations, patient history, and epidemiological information. The expected result is Negative.  Fact Sheet for Patients: SugarRoll.be  Fact Sheet for Healthcare Providers: https://www.woods-mathews.com/  This test is not yet approved or cleared by the [place] FDA and  has been authorized for detection and/or diagnosis of SARS-CoV-2 by FDA under an Emergency Use Authorization (EUA). This EUA  will remain  in effect (meaning this test can be used) for the duration of the COVID-19 declaration under Se ction 564(b)(1) of the Act, 21 U.S.C. section 360bbb-3(b)(1), unless the authorization is terminated or revoked sooner.  Performed at [facility], [place] [address], [place],  [address]      Total time spend on discharging this patient, including the last patient exam, discussing the hospital stay, instructions for ongoing care as it relates to all pertinent caregivers, as well as preparing the medical discharge records, prescriptions, and/or referrals as applicable, is 35 minutes.    Enzo Bi, MD  [facility] 12/27/2020, 9:06 AM

## 2020-12-27 NOTE — Plan of Care (Signed)
Patient sleeping between care. Oriented x4. Large swollen area above RUE splint and right hand. Ice pack applied. Pt denies pain. Able to wiggle fingers. Palpable radial pulse.   PLAN OF CARE ONGOING Problem: Education: Goal: Knowledge of General Education information will improve Description: Including pain rating scale, medication(s)/side effects and non-pharmacologic comfort measures Outcome: Progressing   Problem: Pain Managment: Goal: General experience of comfort will improve Outcome: Progressing   Problem: Safety: Goal: Ability to remain free from injury will improve Outcome: Progressing   Problem: Skin Integrity: Goal: Risk for impaired skin integrity will decrease Outcome: Progressing

## 2020-12-27 NOTE — TOC Progression Note (Signed)
Transition of Care [facility]) - Progression Note    Patient Details  Name: Anne Ferrell MRN: [id number] Date of Birth: 03-Sep-1932  Transition of Care [facility]) CM/SW [place], RN Phone Number: 12/27/2020, 11:18 AM  Clinical Narrative:    The patient will go to room [facility] at [facility]  The bedside nurse to call report  to [phone] [facility] will pick up the patient at 345 PM,  CM notified her daughter and son        Expected Discharge Plan and Services           Expected Discharge Date: 12/27/20                                     Social Determinants of Health (SDOH) Interventions    Readmission Risk Interventions No flowsheet data found.

## 2020-12-27 NOTE — Progress Notes (Signed)
Subjective: 4 Days Post-Op Procedure(s) (LRB): ARTHROPLASTY BIPOLAR HIP (HEMIARTHROPLASTY) (Right) OPEN REDUCTION INTERNAL FIXATION (ORIF) ELBOW/OLECRANON FRACTURE (Right) CLOSED REDUCTION AND PERCUTANEOUS PINNING RIGHT WRIST (Right)   Patient is doing well.  Her son from [place] is present today and we discussed all her injuries and treatment.  She is ready for skilled nursing transfer today. Pain is minimal.  Patient reports pain as mild.  Objective:   VITALS:   Vitals:   12/27/20 0310 12/27/20 0806  BP: 122/70 (!) 145/66  Pulse: 79 79  Resp: 18 16  Temp: 99.4 F (37.4 C) 98.4 F (36.9 C)  SpO2: 96% 95%    Neurologically intact Incision: dressing C/D/I  LABS Recent Labs    12/25/20 0510 12/26/20 0559  HGB 9.7* 9.4*  HCT 28.5* 26.8*  WBC 10.0 9.7  PLT 140* 159    Recent Labs    12/25/20 0510  NA 132*  K 4.3  BUN 13  CREATININE 0.55  GLUCOSE 126*    No results for input(s): LABPT, INR in the last 72 hours.   Assessment/Plan: 4 Days Post-Op Procedure(s) (LRB): ARTHROPLASTY BIPOLAR HIP (HEMIARTHROPLASTY) (Right) OPEN REDUCTION INTERNAL FIXATION (ORIF) ELBOW/OLECRANON FRACTURE (Right) CLOSED REDUCTION AND PERCUTANEOUS PINNING RIGHT WRIST (Right)   Up with therapy  Skilled nursing discharge today.  Remain partial weightbearing right leg with platform my on room the right part of her walker  Return to clinic in 2 weeks for exam and x-rays  Enteric-coated aspirin twice daily for DVT prophylaxis

## 2020-12-27 NOTE — Progress Notes (Signed)
Transportation here to transport pt

## 2020-12-27 NOTE — Progress Notes (Signed)
Updated report with nurse Sherrian at [facility] about indwelling foley cath for retention.

## 2020-12-27 NOTE — TOC Progression Note (Signed)
Transition of Care [facility]) - Progression Note    Patient Details  Name: Anne Ferrell MRN: [id number] Date of Birth: 05/12/1932  Transition of Care [facility]) CM/SW Morrison, RN Phone Number: 12/27/2020, 10:02 AM  Clinical Narrative:      Faxed discharge summary to [facility] at [phone] Provided the bedside nurse the number to call report [phone] [facility] will call me back with a room number and would like her to be brought after 1 PM      Expected Discharge Plan and Services           Expected Discharge Date: 12/27/20                                     Social Determinants of Health (SDOH) Interventions    Readmission Risk Interventions No flowsheet data found.

## 2020-12-28 DIAGNOSIS — S52023D Displaced fracture of olecranon process without intraarticular extension of unspecified ulna, subsequent encounter for closed fracture with routine healing: Secondary | ICD-10-CM | POA: Diagnosis not present

## 2020-12-28 DIAGNOSIS — S72009D Fracture of unspecified part of neck of unspecified femur, subsequent encounter for closed fracture with routine healing: Secondary | ICD-10-CM | POA: Diagnosis not present

## 2020-12-28 DIAGNOSIS — F419 Anxiety disorder, unspecified: Secondary | ICD-10-CM | POA: Diagnosis not present

## 2020-12-28 DIAGNOSIS — S5290XD Unspecified fracture of unspecified forearm, subsequent encounter for closed fracture with routine healing: Secondary | ICD-10-CM | POA: Diagnosis not present

## 2021-01-02 NOTE — Progress Notes (Deleted)
Anne Ferrell T. Delayza Lungren, MD, [place] at [facility] [place], [address]  Phone: [phone]  FAX: [phone]  Anne Ferrell - 87 y.o. female  MRN [id number]  Date of Birth: 1933-03-14  Date: 01/03/2021  PCP: Owens Loffler, MD  Referral: Owens Loffler, MD  No chief complaint on file.   This visit occurred during the SARS-CoV-2 public health emergency.  Safety protocols were in place, including screening questions prior to the visit, additional usage of staff PPE, and extensive cleaning of exam room while observing appropriate contact time as indicated for disinfecting solutions.   Subjective:   Anne Ferrell is a 87 y.o. very pleasant female patient with There is no height or weight on file to calculate BMI. who presents with the following:  This is a general medical follow-up after complex orthopedic trauma.  She was managed by Dr. Sabra Heck in the hospital, who will also manage on follow-up.  After a fall, DOI 12/22/2020 she sustained a displaced subcapital R hip fracture, comminuted R olecranon fx, and a displaced R radial and ulna fracture.   All required ORIF, and she also had a R hemiarthroplasy.  Hgb 9 at discharge, recheck CBC    12/23/2020   12:21 PM   PATIENT:  Anne Ferrell    MRN: [id number]   PRE-OPERATIVE DIAGNOSIS:  Displaced Subcapital fracture right hip       Comminuted displaced right olecranon fracture   Displaced right distal radius and ulna fractures       POST-OPERATIVE DIAGNOSIS: Same   PROCEDURE:  Right   hip hemiarthroplasty with Stryker Accolade prosthesis   Open reduction internal fixation right olecranon fracture with tension band wiring   Closed reduction percutaneous pinning right distal radius and ulna fracture   PREOPERATIVE INDICATIONS:  Anne Ferrell is an 87 y.o. female who was admitted 12/22/2020 with a diagnosis of displaced subcapital fracture of  the hip and elected for surgical management.  The risks benefits and alternatives were discussed with the patient including but not limited to the risks of nonoperative treatment, versus surgical intervention including infection, bleeding, nerve injury, periprosthetic fracture, the need for revision surgery, dislocation, leg length discrepancy, blood clots, cardiopulmonary complications, morbidity, mortality, among others, and they were willing to proceed.  Predicted outcome is good, although there will be at least a six to nine month expected recovery.     SURGEON:  Earnestine Leys, MD   ASST:    ANESTHESIA: Generqal endotaceal    COMPLICATIONS:  None.    EBL:  150 cc    COMPONENTS:  Stryker Accolade Femoral Fracture stem size # 5  ,   and a size   51 mm  fracture head unipolar hip ball with    +8 mm  neck length.    PROCEDURE IN DETAIL: The patient was met in the holding area and identified.  The appropriate hip  was marked at the operative site. The patient was then transported to the OR and  placed under general anesthesia.  At that point, the patient was  placed in the lateral decubitus position with the operative side up and  secured to the operating room table and all bony prominences padded.     The operative lower extremity was prepped from the iliac crest to the toes.  Sterile draping was performed.  Time out was performed prior to incision.      A routine posterolateral approach was utilized via sharp dissection  carried  down to the subcutaneous tissue.  Gross bleeders were Bovie  coagulated.  The iliotibial band was identified and incised  along the length of the skin incision.  Self-retaining retractors were  inserted.  With the hip internally rotated, the short external rotators  were identified. The piriformis was tagged and the hip capsule released in a T-type fashion.   The femoral neck was exposed, and I resected the femoral neck using the appropriate jig. This was performed at  approximately a thumb's breadth above the lesser trochanter.    I then exposed the deep acetabulum, cleared out any tissue including the ligamentum teres.    I then prepared the proximal femur using the cookie-cutter, the lateralizing reamer, and then sequentially broached.   A trial stem   was  utilized along with a unipolar head and neck.  I reduced the hip and it was found to have excellent stability with functional range of motion. Leg lengths were equal.  The trial components were then removed.    The same size Accolade femoral stem was then inserted and was very stable.  The Unitrax head and neck as trialed were inserted as well.      The hip was then reduced and taken through functional range of motion and found to have excellent stability. Leg lengths were restored.      I closed the T in the capsule with #2 Ticron as well as the short external rotators. A hemovac was inserted.     I then irrigated the hip copiously again with pulse lavage, and repaired the fascia with #2 Quill and the subcutaneous layer with #0 Quill. Sponge and needle counts were correct.  Sterile dressing was applied.   The drapes were removed and the patient returned to the supine position.  A hand table was brought in on the right side of the operating room table.  The right hand and arm was then prepped and draped in a sterile fashion.  The right distal radius and ulna fractures were manipulated and seen to be in good position under fluoroscopy.  I placed two 0.62 K wires through the radial styloid across the fracture site to protect the fractures during the ensuing olecranon surgery.  The pins were cut bent and buried beneath the skin.  The arm was then exsanguinated and pneumatic tourniquet inflated to 250 mmHg.  Tourniquet time was 53 minutes.  A longitudinal incision was made over the proximal ulnar shaft curving laterally and over the olecranon and extending proximally.  Dissection was carried out bluntly with  Metzenbaum scissors.  The fracture site was identified and there was abundant clot present.  Great deal of time was spent clearing the fracture site free of clot and debris.  It was irrigated.  The main port of the heart of the olecranon fit nicely back into the ulnar shaft with some comminution on the sides.  I felt it was most important to stabilize the olecranon and ulna.  The fascia was released off the ulnar crest and a drill hole made through the ulnar shaft about 2 inches distal distal to the fracture site.  The fracture was reduced and held in place with a sweetheart clamp.  Two 0.62 K wires were then drilled through the olecranon into the shaft of the ulna until a became embedded in bone.  Fluoroscopy showed the fracture to be well reduced and both pins to be in good position and the ulna.  Number 20-gauge wire was then passed through the  hole in the ulnar shaft and woven in a figure-of-eight bandage figure-of-eight fashion around the proximal pins and and tightened to form a tension band.  The pins were cut and bent and buried.  Fluoroscopy showed satisfactory reduction and good position of the hardware.  This wound was irrigated and closed with 3-0 Vicryl and staples.  After some Marcaine was placed in the wound.  Dry sterile dressings were applied to the wrist and elbow and a long-arm posterior splint applied over good padding.  Tourniquet was removed with good return of blood flow to the hand.   The patient was then awakened and returned to PACU in stable and satisfactory condition. There were no complications.   Park Breed, MD Orthopedic Surgeon [phone]    12/23/2020 12:21 PM  Review of Systems is noted in the HPI, as appropriate  Objective:   There were no vitals taken for this visit.  GEN: No acute distress; alert,appropriate. PULM: Breathing comfortably in no respiratory distress PSYCH: Normally interactive.   Laboratory and Imaging Data:  Assessment and Plan:    ***

## 2021-01-03 ENCOUNTER — Inpatient Hospital Stay: Payer: Medicare Other | Admitting: Family Medicine

## 2021-01-03 DIAGNOSIS — N390 Urinary tract infection, site not specified: Secondary | ICD-10-CM | POA: Diagnosis not present

## 2021-01-03 DIAGNOSIS — R339 Retention of urine, unspecified: Secondary | ICD-10-CM | POA: Diagnosis not present

## 2021-01-10 DIAGNOSIS — S52531A Colles' fracture of right radius, initial encounter for closed fracture: Secondary | ICD-10-CM | POA: Diagnosis not present

## 2021-01-10 DIAGNOSIS — S72031A Displaced midcervical fracture of right femur, initial encounter for closed fracture: Secondary | ICD-10-CM | POA: Diagnosis not present

## 2021-01-10 DIAGNOSIS — S52021A Displaced fracture of olecranon process without intraarticular extension of right ulna, initial encounter for closed fracture: Secondary | ICD-10-CM | POA: Diagnosis not present

## 2021-01-24 DIAGNOSIS — N390 Urinary tract infection, site not specified: Secondary | ICD-10-CM | POA: Diagnosis not present

## 2021-02-05 DIAGNOSIS — S52021A Displaced fracture of olecranon process without intraarticular extension of right ulna, initial encounter for closed fracture: Secondary | ICD-10-CM | POA: Diagnosis not present

## 2021-02-05 DIAGNOSIS — S72031A Displaced midcervical fracture of right femur, initial encounter for closed fracture: Secondary | ICD-10-CM | POA: Diagnosis not present

## 2021-02-05 DIAGNOSIS — S52531A Colles' fracture of right radius, initial encounter for closed fracture: Secondary | ICD-10-CM | POA: Diagnosis not present

## 2021-02-06 DIAGNOSIS — S52023D Displaced fracture of olecranon process without intraarticular extension of unspecified ulna, subsequent encounter for closed fracture with routine healing: Secondary | ICD-10-CM | POA: Diagnosis not present

## 2021-02-06 DIAGNOSIS — I10 Essential (primary) hypertension: Secondary | ICD-10-CM | POA: Diagnosis not present

## 2021-02-06 DIAGNOSIS — S5290XD Unspecified fracture of unspecified forearm, subsequent encounter for closed fracture with routine healing: Secondary | ICD-10-CM | POA: Diagnosis not present

## 2021-02-06 DIAGNOSIS — S72009D Fracture of unspecified part of neck of unspecified femur, subsequent encounter for closed fracture with routine healing: Secondary | ICD-10-CM | POA: Diagnosis not present

## 2021-02-09 DIAGNOSIS — E611 Iron deficiency: Secondary | ICD-10-CM | POA: Diagnosis not present

## 2021-02-16 DIAGNOSIS — S72009D Fracture of unspecified part of neck of unspecified femur, subsequent encounter for closed fracture with routine healing: Secondary | ICD-10-CM | POA: Diagnosis not present

## 2021-02-16 DIAGNOSIS — S52023D Displaced fracture of olecranon process without intraarticular extension of unspecified ulna, subsequent encounter for closed fracture with routine healing: Secondary | ICD-10-CM | POA: Diagnosis not present

## 2021-02-19 ENCOUNTER — Ambulatory Visit: Payer: Medicare Other | Admitting: Family Medicine

## 2021-02-19 DIAGNOSIS — Z9181 History of falling: Secondary | ICD-10-CM | POA: Diagnosis not present

## 2021-02-19 DIAGNOSIS — S52021A Displaced fracture of olecranon process without intraarticular extension of right ulna, initial encounter for closed fracture: Secondary | ICD-10-CM | POA: Diagnosis not present

## 2021-02-19 DIAGNOSIS — I10 Essential (primary) hypertension: Secondary | ICD-10-CM | POA: Diagnosis not present

## 2021-02-19 DIAGNOSIS — S52531A Colles' fracture of right radius, initial encounter for closed fracture: Secondary | ICD-10-CM | POA: Diagnosis not present

## 2021-02-19 DIAGNOSIS — S52021D Displaced fracture of olecranon process without intraarticular extension of right ulna, subsequent encounter for closed fracture with routine healing: Secondary | ICD-10-CM | POA: Diagnosis not present

## 2021-02-19 DIAGNOSIS — S72031A Displaced midcervical fracture of right femur, initial encounter for closed fracture: Secondary | ICD-10-CM | POA: Diagnosis not present

## 2021-02-19 DIAGNOSIS — F419 Anxiety disorder, unspecified: Secondary | ICD-10-CM | POA: Diagnosis not present

## 2021-02-19 DIAGNOSIS — Z96641 Presence of right artificial hip joint: Secondary | ICD-10-CM | POA: Diagnosis not present

## 2021-02-19 DIAGNOSIS — W19XXXD Unspecified fall, subsequent encounter: Secondary | ICD-10-CM | POA: Diagnosis not present

## 2021-02-19 DIAGNOSIS — S52201D Unspecified fracture of shaft of right ulna, subsequent encounter for closed fracture with routine healing: Secondary | ICD-10-CM | POA: Diagnosis not present

## 2021-02-19 DIAGNOSIS — S52301D Unspecified fracture of shaft of right radius, subsequent encounter for closed fracture with routine healing: Secondary | ICD-10-CM | POA: Diagnosis not present

## 2021-02-19 DIAGNOSIS — S72011D Unspecified intracapsular fracture of right femur, subsequent encounter for closed fracture with routine healing: Secondary | ICD-10-CM | POA: Diagnosis not present

## 2021-02-19 NOTE — Progress Notes (Deleted)
    Anne Audi [name], MD, Anne Ferrell at [facility] [place], [address]  Phone: [phone]  FAX: [phone]  Anne Ferrell - 87 y.o. female  MRN [id number]  Date of Birth: [birth year]  Date: 02/19/2021  PCP: Owens Loffler, MD  Referral: Owens Loffler, MD  No chief complaint on file.   This visit occurred during the SARS-CoV-2 public health emergency.  Safety protocols were in place, including screening questions prior to the visit, additional usage of staff PPE, and extensive cleaning of exam room while observing appropriate contact time as indicated for disinfecting solutions.   Subjective:   Anne Ferrell is a 87 y.o. very pleasant female patient with There is no height or weight on file to calculate BMI. who presents with the following:  F/u s/p R hip fracture and R elbow fracture.  Review of Systems is noted in the HPI, as appropriate  Objective:   There were no vitals taken for this visit.  GEN: No acute distress; alert,appropriate. PULM: Breathing comfortably in no respiratory distress PSYCH: Normally interactive.   Laboratory and Imaging Data:  Assessment and Plan:   ***

## 2021-02-21 DIAGNOSIS — S52301D Unspecified fracture of shaft of right radius, subsequent encounter for closed fracture with routine healing: Secondary | ICD-10-CM | POA: Diagnosis not present

## 2021-02-21 DIAGNOSIS — I10 Essential (primary) hypertension: Secondary | ICD-10-CM | POA: Diagnosis not present

## 2021-02-21 DIAGNOSIS — S72011D Unspecified intracapsular fracture of right femur, subsequent encounter for closed fracture with routine healing: Secondary | ICD-10-CM | POA: Diagnosis not present

## 2021-02-21 DIAGNOSIS — S52201D Unspecified fracture of shaft of right ulna, subsequent encounter for closed fracture with routine healing: Secondary | ICD-10-CM | POA: Diagnosis not present

## 2021-02-21 DIAGNOSIS — W19XXXD Unspecified fall, subsequent encounter: Secondary | ICD-10-CM | POA: Diagnosis not present

## 2021-02-21 DIAGNOSIS — S52021D Displaced fracture of olecranon process without intraarticular extension of right ulna, subsequent encounter for closed fracture with routine healing: Secondary | ICD-10-CM | POA: Diagnosis not present

## 2021-02-22 DIAGNOSIS — W19XXXD Unspecified fall, subsequent encounter: Secondary | ICD-10-CM | POA: Diagnosis not present

## 2021-02-22 DIAGNOSIS — S52201D Unspecified fracture of shaft of right ulna, subsequent encounter for closed fracture with routine healing: Secondary | ICD-10-CM | POA: Diagnosis not present

## 2021-02-22 DIAGNOSIS — I10 Essential (primary) hypertension: Secondary | ICD-10-CM | POA: Diagnosis not present

## 2021-02-22 DIAGNOSIS — S52021D Displaced fracture of olecranon process without intraarticular extension of right ulna, subsequent encounter for closed fracture with routine healing: Secondary | ICD-10-CM | POA: Diagnosis not present

## 2021-02-22 DIAGNOSIS — S52301D Unspecified fracture of shaft of right radius, subsequent encounter for closed fracture with routine healing: Secondary | ICD-10-CM | POA: Diagnosis not present

## 2021-02-22 DIAGNOSIS — S72011D Unspecified intracapsular fracture of right femur, subsequent encounter for closed fracture with routine healing: Secondary | ICD-10-CM | POA: Diagnosis not present

## 2021-02-23 DIAGNOSIS — S52021D Displaced fracture of olecranon process without intraarticular extension of right ulna, subsequent encounter for closed fracture with routine healing: Secondary | ICD-10-CM | POA: Diagnosis not present

## 2021-02-23 DIAGNOSIS — W19XXXD Unspecified fall, subsequent encounter: Secondary | ICD-10-CM | POA: Diagnosis not present

## 2021-02-23 DIAGNOSIS — S72011D Unspecified intracapsular fracture of right femur, subsequent encounter for closed fracture with routine healing: Secondary | ICD-10-CM | POA: Diagnosis not present

## 2021-02-23 DIAGNOSIS — S52301D Unspecified fracture of shaft of right radius, subsequent encounter for closed fracture with routine healing: Secondary | ICD-10-CM | POA: Diagnosis not present

## 2021-02-23 DIAGNOSIS — I10 Essential (primary) hypertension: Secondary | ICD-10-CM | POA: Diagnosis not present

## 2021-02-23 DIAGNOSIS — S52201D Unspecified fracture of shaft of right ulna, subsequent encounter for closed fracture with routine healing: Secondary | ICD-10-CM | POA: Diagnosis not present

## 2021-02-26 DIAGNOSIS — S52201D Unspecified fracture of shaft of right ulna, subsequent encounter for closed fracture with routine healing: Secondary | ICD-10-CM | POA: Diagnosis not present

## 2021-02-26 DIAGNOSIS — S72011D Unspecified intracapsular fracture of right femur, subsequent encounter for closed fracture with routine healing: Secondary | ICD-10-CM | POA: Diagnosis not present

## 2021-02-26 DIAGNOSIS — S52021D Displaced fracture of olecranon process without intraarticular extension of right ulna, subsequent encounter for closed fracture with routine healing: Secondary | ICD-10-CM | POA: Diagnosis not present

## 2021-02-26 DIAGNOSIS — S52301D Unspecified fracture of shaft of right radius, subsequent encounter for closed fracture with routine healing: Secondary | ICD-10-CM | POA: Diagnosis not present

## 2021-02-26 DIAGNOSIS — I10 Essential (primary) hypertension: Secondary | ICD-10-CM | POA: Diagnosis not present

## 2021-02-26 DIAGNOSIS — W19XXXD Unspecified fall, subsequent encounter: Secondary | ICD-10-CM | POA: Diagnosis not present

## 2021-02-28 DIAGNOSIS — S52201D Unspecified fracture of shaft of right ulna, subsequent encounter for closed fracture with routine healing: Secondary | ICD-10-CM | POA: Diagnosis not present

## 2021-02-28 DIAGNOSIS — S52021D Displaced fracture of olecranon process without intraarticular extension of right ulna, subsequent encounter for closed fracture with routine healing: Secondary | ICD-10-CM | POA: Diagnosis not present

## 2021-02-28 DIAGNOSIS — S72011D Unspecified intracapsular fracture of right femur, subsequent encounter for closed fracture with routine healing: Secondary | ICD-10-CM | POA: Diagnosis not present

## 2021-02-28 DIAGNOSIS — I10 Essential (primary) hypertension: Secondary | ICD-10-CM | POA: Diagnosis not present

## 2021-02-28 DIAGNOSIS — W19XXXD Unspecified fall, subsequent encounter: Secondary | ICD-10-CM | POA: Diagnosis not present

## 2021-02-28 DIAGNOSIS — S52301D Unspecified fracture of shaft of right radius, subsequent encounter for closed fracture with routine healing: Secondary | ICD-10-CM | POA: Diagnosis not present

## 2021-03-01 DIAGNOSIS — W19XXXD Unspecified fall, subsequent encounter: Secondary | ICD-10-CM | POA: Diagnosis not present

## 2021-03-01 DIAGNOSIS — I10 Essential (primary) hypertension: Secondary | ICD-10-CM | POA: Diagnosis not present

## 2021-03-01 DIAGNOSIS — S52301D Unspecified fracture of shaft of right radius, subsequent encounter for closed fracture with routine healing: Secondary | ICD-10-CM | POA: Diagnosis not present

## 2021-03-01 DIAGNOSIS — S52021D Displaced fracture of olecranon process without intraarticular extension of right ulna, subsequent encounter for closed fracture with routine healing: Secondary | ICD-10-CM | POA: Diagnosis not present

## 2021-03-01 DIAGNOSIS — S52201D Unspecified fracture of shaft of right ulna, subsequent encounter for closed fracture with routine healing: Secondary | ICD-10-CM | POA: Diagnosis not present

## 2021-03-01 DIAGNOSIS — S72011D Unspecified intracapsular fracture of right femur, subsequent encounter for closed fracture with routine healing: Secondary | ICD-10-CM | POA: Diagnosis not present

## 2021-03-02 DIAGNOSIS — H401132 Primary open-angle glaucoma, bilateral, moderate stage: Secondary | ICD-10-CM | POA: Diagnosis not present

## 2021-03-04 DIAGNOSIS — Z23 Encounter for immunization: Secondary | ICD-10-CM | POA: Diagnosis not present

## 2021-03-05 DIAGNOSIS — S52021D Displaced fracture of olecranon process without intraarticular extension of right ulna, subsequent encounter for closed fracture with routine healing: Secondary | ICD-10-CM | POA: Diagnosis not present

## 2021-03-05 DIAGNOSIS — S52301D Unspecified fracture of shaft of right radius, subsequent encounter for closed fracture with routine healing: Secondary | ICD-10-CM | POA: Diagnosis not present

## 2021-03-05 DIAGNOSIS — S72011D Unspecified intracapsular fracture of right femur, subsequent encounter for closed fracture with routine healing: Secondary | ICD-10-CM | POA: Diagnosis not present

## 2021-03-05 DIAGNOSIS — S52201D Unspecified fracture of shaft of right ulna, subsequent encounter for closed fracture with routine healing: Secondary | ICD-10-CM | POA: Diagnosis not present

## 2021-03-05 DIAGNOSIS — W19XXXD Unspecified fall, subsequent encounter: Secondary | ICD-10-CM | POA: Diagnosis not present

## 2021-03-05 DIAGNOSIS — I10 Essential (primary) hypertension: Secondary | ICD-10-CM | POA: Diagnosis not present

## 2021-03-06 DIAGNOSIS — I10 Essential (primary) hypertension: Secondary | ICD-10-CM | POA: Diagnosis not present

## 2021-03-06 DIAGNOSIS — W19XXXD Unspecified fall, subsequent encounter: Secondary | ICD-10-CM | POA: Diagnosis not present

## 2021-03-06 DIAGNOSIS — S52201D Unspecified fracture of shaft of right ulna, subsequent encounter for closed fracture with routine healing: Secondary | ICD-10-CM | POA: Diagnosis not present

## 2021-03-06 DIAGNOSIS — S52301D Unspecified fracture of shaft of right radius, subsequent encounter for closed fracture with routine healing: Secondary | ICD-10-CM | POA: Diagnosis not present

## 2021-03-06 DIAGNOSIS — S72011D Unspecified intracapsular fracture of right femur, subsequent encounter for closed fracture with routine healing: Secondary | ICD-10-CM | POA: Diagnosis not present

## 2021-03-06 DIAGNOSIS — S52021D Displaced fracture of olecranon process without intraarticular extension of right ulna, subsequent encounter for closed fracture with routine healing: Secondary | ICD-10-CM | POA: Diagnosis not present

## 2021-03-07 DIAGNOSIS — S52301D Unspecified fracture of shaft of right radius, subsequent encounter for closed fracture with routine healing: Secondary | ICD-10-CM | POA: Diagnosis not present

## 2021-03-07 DIAGNOSIS — I10 Essential (primary) hypertension: Secondary | ICD-10-CM | POA: Diagnosis not present

## 2021-03-07 DIAGNOSIS — S52021D Displaced fracture of olecranon process without intraarticular extension of right ulna, subsequent encounter for closed fracture with routine healing: Secondary | ICD-10-CM | POA: Diagnosis not present

## 2021-03-07 DIAGNOSIS — S72011D Unspecified intracapsular fracture of right femur, subsequent encounter for closed fracture with routine healing: Secondary | ICD-10-CM | POA: Diagnosis not present

## 2021-03-07 DIAGNOSIS — W19XXXD Unspecified fall, subsequent encounter: Secondary | ICD-10-CM | POA: Diagnosis not present

## 2021-03-07 DIAGNOSIS — S52201D Unspecified fracture of shaft of right ulna, subsequent encounter for closed fracture with routine healing: Secondary | ICD-10-CM | POA: Diagnosis not present

## 2021-03-08 DIAGNOSIS — S72011D Unspecified intracapsular fracture of right femur, subsequent encounter for closed fracture with routine healing: Secondary | ICD-10-CM | POA: Diagnosis not present

## 2021-03-08 DIAGNOSIS — I10 Essential (primary) hypertension: Secondary | ICD-10-CM | POA: Diagnosis not present

## 2021-03-08 DIAGNOSIS — S52201D Unspecified fracture of shaft of right ulna, subsequent encounter for closed fracture with routine healing: Secondary | ICD-10-CM | POA: Diagnosis not present

## 2021-03-08 DIAGNOSIS — S52021D Displaced fracture of olecranon process without intraarticular extension of right ulna, subsequent encounter for closed fracture with routine healing: Secondary | ICD-10-CM | POA: Diagnosis not present

## 2021-03-08 DIAGNOSIS — W19XXXD Unspecified fall, subsequent encounter: Secondary | ICD-10-CM | POA: Diagnosis not present

## 2021-03-08 DIAGNOSIS — S52301D Unspecified fracture of shaft of right radius, subsequent encounter for closed fracture with routine healing: Secondary | ICD-10-CM | POA: Diagnosis not present

## 2021-03-09 DIAGNOSIS — S52301D Unspecified fracture of shaft of right radius, subsequent encounter for closed fracture with routine healing: Secondary | ICD-10-CM | POA: Diagnosis not present

## 2021-03-09 DIAGNOSIS — I10 Essential (primary) hypertension: Secondary | ICD-10-CM | POA: Diagnosis not present

## 2021-03-09 DIAGNOSIS — W19XXXD Unspecified fall, subsequent encounter: Secondary | ICD-10-CM | POA: Diagnosis not present

## 2021-03-09 DIAGNOSIS — S52021D Displaced fracture of olecranon process without intraarticular extension of right ulna, subsequent encounter for closed fracture with routine healing: Secondary | ICD-10-CM | POA: Diagnosis not present

## 2021-03-09 DIAGNOSIS — S72011D Unspecified intracapsular fracture of right femur, subsequent encounter for closed fracture with routine healing: Secondary | ICD-10-CM | POA: Diagnosis not present

## 2021-03-09 DIAGNOSIS — S52201D Unspecified fracture of shaft of right ulna, subsequent encounter for closed fracture with routine healing: Secondary | ICD-10-CM | POA: Diagnosis not present

## 2021-03-12 DIAGNOSIS — S52021A Displaced fracture of olecranon process without intraarticular extension of right ulna, initial encounter for closed fracture: Secondary | ICD-10-CM | POA: Diagnosis not present

## 2021-03-12 DIAGNOSIS — S52531A Colles' fracture of right radius, initial encounter for closed fracture: Secondary | ICD-10-CM | POA: Diagnosis not present

## 2021-03-13 DIAGNOSIS — S52021D Displaced fracture of olecranon process without intraarticular extension of right ulna, subsequent encounter for closed fracture with routine healing: Secondary | ICD-10-CM | POA: Diagnosis not present

## 2021-03-13 DIAGNOSIS — W19XXXD Unspecified fall, subsequent encounter: Secondary | ICD-10-CM | POA: Diagnosis not present

## 2021-03-13 DIAGNOSIS — S72011D Unspecified intracapsular fracture of right femur, subsequent encounter for closed fracture with routine healing: Secondary | ICD-10-CM | POA: Diagnosis not present

## 2021-03-13 DIAGNOSIS — S52301D Unspecified fracture of shaft of right radius, subsequent encounter for closed fracture with routine healing: Secondary | ICD-10-CM | POA: Diagnosis not present

## 2021-03-13 DIAGNOSIS — S52201D Unspecified fracture of shaft of right ulna, subsequent encounter for closed fracture with routine healing: Secondary | ICD-10-CM | POA: Diagnosis not present

## 2021-03-13 DIAGNOSIS — I10 Essential (primary) hypertension: Secondary | ICD-10-CM | POA: Diagnosis not present

## 2021-03-14 DIAGNOSIS — I10 Essential (primary) hypertension: Secondary | ICD-10-CM | POA: Diagnosis not present

## 2021-03-14 DIAGNOSIS — S52301D Unspecified fracture of shaft of right radius, subsequent encounter for closed fracture with routine healing: Secondary | ICD-10-CM | POA: Diagnosis not present

## 2021-03-14 DIAGNOSIS — S52021D Displaced fracture of olecranon process without intraarticular extension of right ulna, subsequent encounter for closed fracture with routine healing: Secondary | ICD-10-CM | POA: Diagnosis not present

## 2021-03-14 DIAGNOSIS — W19XXXD Unspecified fall, subsequent encounter: Secondary | ICD-10-CM | POA: Diagnosis not present

## 2021-03-14 DIAGNOSIS — S52201D Unspecified fracture of shaft of right ulna, subsequent encounter for closed fracture with routine healing: Secondary | ICD-10-CM | POA: Diagnosis not present

## 2021-03-14 DIAGNOSIS — S72011D Unspecified intracapsular fracture of right femur, subsequent encounter for closed fracture with routine healing: Secondary | ICD-10-CM | POA: Diagnosis not present

## 2021-03-16 DIAGNOSIS — S52531A Colles' fracture of right radius, initial encounter for closed fracture: Secondary | ICD-10-CM | POA: Diagnosis not present

## 2021-03-16 DIAGNOSIS — S52021A Displaced fracture of olecranon process without intraarticular extension of right ulna, initial encounter for closed fracture: Secondary | ICD-10-CM | POA: Diagnosis not present

## 2021-03-26 DIAGNOSIS — S52531A Colles' fracture of right radius, initial encounter for closed fracture: Secondary | ICD-10-CM | POA: Diagnosis not present

## 2021-03-26 DIAGNOSIS — S52021A Displaced fracture of olecranon process without intraarticular extension of right ulna, initial encounter for closed fracture: Secondary | ICD-10-CM | POA: Diagnosis not present

## 2021-03-29 DIAGNOSIS — R000 Tachycardia, unspecified: Secondary | ICD-10-CM | POA: Diagnosis not present

## 2021-03-29 DIAGNOSIS — I38 Endocarditis, valve unspecified: Secondary | ICD-10-CM | POA: Diagnosis not present

## 2021-03-29 DIAGNOSIS — D649 Anemia, unspecified: Secondary | ICD-10-CM | POA: Diagnosis not present

## 2021-03-29 DIAGNOSIS — I10 Essential (primary) hypertension: Secondary | ICD-10-CM | POA: Diagnosis not present

## 2021-03-29 DIAGNOSIS — I358 Other nonrheumatic aortic valve disorders: Secondary | ICD-10-CM | POA: Diagnosis not present

## 2021-04-02 DIAGNOSIS — S52021A Displaced fracture of olecranon process without intraarticular extension of right ulna, initial encounter for closed fracture: Secondary | ICD-10-CM | POA: Diagnosis not present

## 2021-04-02 DIAGNOSIS — S52531A Colles' fracture of right radius, initial encounter for closed fracture: Secondary | ICD-10-CM | POA: Diagnosis not present

## 2021-04-05 DIAGNOSIS — S52531A Colles' fracture of right radius, initial encounter for closed fracture: Secondary | ICD-10-CM | POA: Diagnosis not present

## 2021-04-05 DIAGNOSIS — S52021A Displaced fracture of olecranon process without intraarticular extension of right ulna, initial encounter for closed fracture: Secondary | ICD-10-CM | POA: Diagnosis not present

## 2021-04-09 DIAGNOSIS — S52021A Displaced fracture of olecranon process without intraarticular extension of right ulna, initial encounter for closed fracture: Secondary | ICD-10-CM | POA: Diagnosis not present

## 2021-04-09 DIAGNOSIS — S52531A Colles' fracture of right radius, initial encounter for closed fracture: Secondary | ICD-10-CM | POA: Diagnosis not present

## 2021-04-18 DIAGNOSIS — S52021A Displaced fracture of olecranon process without intraarticular extension of right ulna, initial encounter for closed fracture: Secondary | ICD-10-CM | POA: Diagnosis not present

## 2021-04-18 DIAGNOSIS — S52531A Colles' fracture of right radius, initial encounter for closed fracture: Secondary | ICD-10-CM | POA: Diagnosis not present

## 2021-04-20 DIAGNOSIS — S52531A Colles' fracture of right radius, initial encounter for closed fracture: Secondary | ICD-10-CM | POA: Diagnosis not present

## 2021-04-20 DIAGNOSIS — S52021A Displaced fracture of olecranon process without intraarticular extension of right ulna, initial encounter for closed fracture: Secondary | ICD-10-CM | POA: Diagnosis not present

## 2021-04-25 DIAGNOSIS — S52531A Colles' fracture of right radius, initial encounter for closed fracture: Secondary | ICD-10-CM | POA: Diagnosis not present

## 2021-04-25 DIAGNOSIS — S52021A Displaced fracture of olecranon process without intraarticular extension of right ulna, initial encounter for closed fracture: Secondary | ICD-10-CM | POA: Diagnosis not present

## 2021-04-27 DIAGNOSIS — M25551 Pain in right hip: Secondary | ICD-10-CM | POA: Diagnosis not present

## 2021-04-27 DIAGNOSIS — S52531A Colles' fracture of right radius, initial encounter for closed fracture: Secondary | ICD-10-CM | POA: Diagnosis not present

## 2021-05-31 DIAGNOSIS — H401112 Primary open-angle glaucoma, right eye, moderate stage: Secondary | ICD-10-CM | POA: Diagnosis not present

## 2021-07-05 DIAGNOSIS — I358 Other nonrheumatic aortic valve disorders: Secondary | ICD-10-CM | POA: Diagnosis not present

## 2021-07-05 DIAGNOSIS — I10 Essential (primary) hypertension: Secondary | ICD-10-CM | POA: Diagnosis not present

## 2021-07-05 DIAGNOSIS — I38 Endocarditis, valve unspecified: Secondary | ICD-10-CM | POA: Diagnosis not present

## 2021-07-05 DIAGNOSIS — R000 Tachycardia, unspecified: Secondary | ICD-10-CM | POA: Diagnosis not present

## 2021-07-05 DIAGNOSIS — R600 Localized edema: Secondary | ICD-10-CM | POA: Diagnosis not present

## 2021-07-16 DIAGNOSIS — Z20822 Contact with and (suspected) exposure to covid-19: Secondary | ICD-10-CM | POA: Diagnosis not present

## 2021-08-30 DIAGNOSIS — H401121 Primary open-angle glaucoma, left eye, mild stage: Secondary | ICD-10-CM | POA: Diagnosis not present

## 2021-10-05 DIAGNOSIS — L609 Nail disorder, unspecified: Secondary | ICD-10-CM | POA: Diagnosis not present

## 2021-10-05 DIAGNOSIS — D2261 Melanocytic nevi of right upper limb, including shoulder: Secondary | ICD-10-CM | POA: Diagnosis not present

## 2021-10-05 DIAGNOSIS — Z85828 Personal history of other malignant neoplasm of skin: Secondary | ICD-10-CM | POA: Diagnosis not present

## 2021-10-05 DIAGNOSIS — L821 Other seborrheic keratosis: Secondary | ICD-10-CM | POA: Diagnosis not present

## 2021-10-05 DIAGNOSIS — Z08 Encounter for follow-up examination after completed treatment for malignant neoplasm: Secondary | ICD-10-CM | POA: Diagnosis not present

## 2021-10-05 DIAGNOSIS — D225 Melanocytic nevi of trunk: Secondary | ICD-10-CM | POA: Diagnosis not present

## 2021-11-05 ENCOUNTER — Other Ambulatory Visit: Payer: Self-pay

## 2021-11-06 ENCOUNTER — Ambulatory Visit (INDEPENDENT_AMBULATORY_CARE_PROVIDER_SITE_OTHER): Payer: Medicare Other

## 2021-11-06 VITALS — Ht 69.5 in | Wt 129.0 lb

## 2021-11-06 DIAGNOSIS — Z0000 Encounter for general adult medical examination without abnormal findings: Secondary | ICD-10-CM | POA: Diagnosis not present

## 2021-11-06 NOTE — Patient Instructions (Signed)
Anne Ferrell , Thank you for taking time to come for your Medicare Wellness Visit. I appreciate your ongoing commitment to your health goals. Please review the following plan we discussed and let me know if I can assist you in the future.   Screening recommendations/referrals: Colonoscopy: aged out Mammogram: aged out Bone Density: aged out Recommended yearly ophthalmology/optometry visit for glaucoma screening and checkup Recommended yearly dental visit for hygiene and checkup  Vaccinations: Influenza vaccine: n/d Pneumococcal vaccine: 09/12/15 Tdap vaccine: n/d Shingles vaccine: n/d   Covid-19:05/22/19, 06/12/19, 04/30/20, 12/27/20  Advanced directives: no  Conditions/risks identified: none  Next appointment: Follow up in one year for your annual wellness visit 11/11/22 @ 9 am by phone   Preventive Care 65 Years and Older, Female Preventive care refers to lifestyle choices and visits with your health care provider that can promote health and wellness. What does preventive care include? A yearly physical exam. This is also called an annual well check. Dental exams once or twice a year. Routine eye exams. Ask your health care provider how often you should have your eyes checked. Personal lifestyle choices, including: Daily care of your teeth and gums. Regular physical activity. Eating a healthy diet. Avoiding tobacco and drug use. Limiting alcohol use. Practicing safe sex. Taking low-dose aspirin every day. Taking vitamin and mineral supplements as recommended by your health care provider. What happens during an annual well check? The services and screenings done by your health care provider during your annual well check will depend on your age, overall health, lifestyle risk factors, and family history of disease. Counseling  Your health care provider may ask you questions about your: Alcohol use. Tobacco use. Drug use. Emotional well-being. Home and relationship  well-being. Sexual activity. Eating habits. History of falls. Memory and ability to understand (cognition). Work and work Statistician. Reproductive health. Screening  You may have the following tests or measurements: Height, weight, and BMI. Blood pressure. Lipid and cholesterol levels. These may be checked every 5 years, or more frequently if you are over 110 years old. Skin check. Lung cancer screening. You may have this screening every year starting at age 62 if you have a 30-pack-year history of smoking and currently smoke or have quit within the past 15 years. Fecal occult blood test (FOBT) of the stool. You may have this test every year starting at age 79. Flexible sigmoidoscopy or colonoscopy. You may have a sigmoidoscopy every 5 years or a colonoscopy every 10 years starting at age 20. Hepatitis C blood test. Hepatitis B blood test. Sexually transmitted disease (STD) testing. Diabetes screening. This is done by checking your blood sugar (glucose) after you have not eaten for a while (fasting). You may have this done every 1-3 years. Bone density scan. This is done to screen for osteoporosis. You may have this done starting at age 65. Mammogram. This may be done every 1-2 years. Talk to your health care provider about how often you should have regular mammograms. Talk with your health care provider about your test results, treatment options, and if necessary, the need for more tests. Vaccines  Your health care provider may recommend certain vaccines, such as: Influenza vaccine. This is recommended every year. Tetanus, diphtheria, and acellular pertussis (Tdap, Td) vaccine. You may need a Td booster every 10 years. Zoster vaccine. You may need this after age 32. Pneumococcal 13-valent conjugate (PCV13) vaccine. One dose is recommended after age 25. Pneumococcal polysaccharide (PPSV23) vaccine. One dose is recommended after age 59. Talk  to your health care provider about which  screenings and vaccines you need and how often you need them. This information is not intended to replace advice given to you by your health care provider. Make sure you discuss any questions you have with your health care provider. Document Released: 05/12/2015 Document Revised: 01/03/2016 Document Reviewed: 02/14/2015 Elsevier Interactive Patient Education  2017 [place] Prevention in the Home Falls can cause injuries. They can happen to people of all ages. There are many things you can do to make your home safe and to help prevent falls. What can I do on the outside of my home? Regularly fix the edges of walkways and driveways and fix any cracks. Remove anything that might make you trip as you walk through a door, such as a raised step or threshold. Trim any bushes or trees on the path to your home. Use bright outdoor lighting. Clear any walking paths of anything that might make someone trip, such as rocks or tools. Regularly check to see if handrails are loose or broken. Make sure that both sides of any steps have handrails. Any raised decks and porches should have guardrails on the edges. Have any leaves, snow, or ice cleared regularly. Use sand or salt on walking paths during winter. Clean up any spills in your garage right away. This includes oil or grease spills. What can I do in the bathroom? Use night lights. Install grab bars by the toilet and in the tub and shower. Do not use towel bars as grab bars. Use non-skid mats or decals in the tub or shower. If you need to sit down in the shower, use a plastic, non-slip stool. Keep the floor dry. Clean up any water that spills on the floor as soon as it happens. Remove soap buildup in the tub or shower regularly. Attach bath mats securely with double-sided non-slip rug tape. Do not have throw rugs and other things on the floor that can make you trip. What can I do in the bedroom? Use night lights. Make sure that you have a  light by your bed that is easy to reach. Do not use any sheets or blankets that are too big for your bed. They should not hang down onto the floor. Have a firm chair that has side arms. You can use this for support while you get dressed. Do not have throw rugs and other things on the floor that can make you trip. What can I do in the kitchen? Clean up any spills right away. Avoid walking on wet floors. Keep items that you use a lot in easy-to-reach places. If you need to reach something above you, use a strong step stool that has a grab bar. Keep electrical cords out of the way. Do not use floor polish or wax that makes floors slippery. If you must use wax, use non-skid floor wax. Do not have throw rugs and other things on the floor that can make you trip. What can I do with my stairs? Do not leave any items on the stairs. Make sure that there are handrails on both sides of the stairs and use them. Fix handrails that are broken or loose. Make sure that handrails are as long as the stairways. Check any carpeting to make sure that it is firmly attached to the stairs. Fix any carpet that is loose or worn. Avoid having throw rugs at the top or bottom of the stairs. If you do have throw rugs,  attach them to the floor with carpet tape. Make sure that you have a light switch at the top of the stairs and the bottom of the stairs. If you do not have them, ask someone to add them for you. What else can I do to help prevent falls? Wear shoes that: Do not have high heels. Have rubber bottoms. Are comfortable and fit you well. Are closed at the toe. Do not wear sandals. If you use a stepladder: Make sure that it is fully opened. Do not climb a closed stepladder. Make sure that both sides of the stepladder are locked into place. Ask someone to hold it for you, if possible. Clearly mark and make sure that you can see: Any grab bars or handrails. First and last steps. Where the edge of each step  is. Use tools that help you move around (mobility aids) if they are needed. These include: Canes. Walkers. Scooters. Crutches. Turn on the lights when you go into a dark area. Replace any light bulbs as soon as they burn out. Set up your furniture so you have a clear path. Avoid moving your furniture around. If any of your floors are uneven, fix them. If there are any pets around you, be aware of where they are. Review your medicines with your doctor. Some medicines can make you feel dizzy. This can increase your chance of falling. Ask your doctor what other things that you can do to help prevent falls. This information is not intended to replace advice given to you by your health care provider. Make sure you discuss any questions you have with your health care provider. Document Released: 02/09/2009 Document Revised: 09/21/2015 Document Reviewed: 05/20/2014 Elsevier Interactive Patient Education  2017 [employer]. a

## 2021-11-06 NOTE — Progress Notes (Signed)
Virtual Visit via Telephone Note  I connected with  Anne Ferrell on 11/06/21 at  9:00 AM EDT by telephone and verified that I am speaking with the correct person using two identifiers.  Location: Patient: home Provider: [place] Persons participating in the virtual visit: [place]   I discussed the limitations, risks, security and privacy concerns of performing an evaluation and management service by telephone and the availability of in person appointments. The patient expressed understanding and agreed to proceed.  Interactive audio and video telecommunications were attempted between this nurse and patient, however failed, due to patient having technical difficulties OR patient did not have access to video capability.  We continued and completed visit with audio only.  Some vital signs may be absent or patient reported.   Dionisio David, LPN  Subjective:   Anne Ferrell is a 88 y.o. female who presents for Medicare Annual (Subsequent) preventive examination.  Review of Systems           Objective:    There were no vitals filed for this visit. There is no height or weight on file to calculate BMI.     12/22/2020    9:44 AM 11/03/2020    9:09 AM 11/03/2019    9:05 AM 10/10/2017    8:59 AM 10/03/2016   10:16 AM 09/12/2015   10:23 AM 02/24/2015    6:33 PM  Advanced Directives  Does Patient Have a Medical Advance Directive? No;Yes Yes Yes Yes Yes Yes No  Type of Paramedic of [place];Living will [place];Living will [place];Living will [place];Living will [place];Living will   Does patient want to make changes to medical advance directive? No - Patient declined     No - Patient declined   Copy of [place] in Chart? No - copy requested No - copy requested No - copy requested No - copy requested  No - copy requested No - copy requested   Would patient like information on creating a medical advance directive? No - Patient declined      No - patient declined information    Current Medications (verified) Outpatient Encounter Medications as of 11/06/2021  Medication Sig   atenolol (TENORMIN) 25 MG tablet Take 1 tablet by mouth once daily   calcium carbonate (OS-CAL - DOSED IN MG OF ELEMENTAL CALCIUM) 1250 (500 Ca) MG tablet Take 1 tablet (500 mg of elemental calcium total) by mouth daily with breakfast.   docusate sodium (COLACE) 100 MG capsule Take 1 capsule (100 mg total) by mouth 2 (two) times daily.   feeding supplement (ENSURE SURGERY) LIQD Take 237 mLs by mouth 2 (two) times daily between meals.   furosemide (LASIX) 20 MG tablet Take by mouth.   Magnesium 250 MG TABS Take 2 tablets by mouth daily.   methocarbamol (ROBAXIN) 500 MG tablet Take 1 tablet (500 mg total) by mouth every 6 (six) hours as needed for muscle spasms.   Multiple Vitamins-Minerals (PRESERVISION AREDS) CAPS Take 1 capsule by mouth daily.   VITAMIN A PO Take 2 tablets by mouth daily. 10,000 units   vitamin B-12 (CYANOCOBALAMIN) 1000 MCG tablet Take 1,000 mcg by mouth daily.   Vitamin D, Cholecalciferol, 25 MCG (1000 UT) TABS Take 1 tablet by mouth daily.   ferrous sulfate 325 (65 FE) MG tablet Take 1 tablet (325 mg total) by mouth daily with breakfast.  No facility-administered encounter medications on file as of 11/06/2021.    Allergies (verified) Anise flavor, Ciprofloxacin, Levofloxacin, and Licorice flavor [flavoring agent]   History: Past Medical History:  Diagnosis Date   Arthritis    Diverticulitis    Glaucoma    Heart murmur    High blood pressure    Osteoporosis 10/05/2015   Vitamin D deficiency 09/18/2015   Past Surgical History:  Procedure Laterality Date   ABDOMINAL HYSTERECTOMY  2008   cataract surgery Bilateral 2011   CLOSED REDUCTION WRIST FRACTURE Right 12/23/2020   Procedure: CLOSED  REDUCTION AND PERCUTANEOUS PINNING RIGHT WRIST;  Surgeon: Earnestine Leys, MD;  Location: [facility] ORS;  Service: Orthopedics;  Laterality: Right;   HIP ARTHROPLASTY Right 12/23/2020   Procedure: ARTHROPLASTY BIPOLAR HIP (HEMIARTHROPLASTY);  Surgeon: Earnestine Leys, MD;  Location: [facility] ORS;  Service: Orthopedics;  Laterality: Right;   ORIF ELBOW FRACTURE Right 12/23/2020   Procedure: OPEN REDUCTION INTERNAL FIXATION (ORIF) ELBOW/OLECRANON FRACTURE;  Surgeon: Earnestine Leys, MD;  Location: [facility] ORS;  Service: Orthopedics;  Laterality: Right;   TONSILLECTOMY AND ADENOIDECTOMY  1941   Family History  Problem Relation Age of Onset   Cancer Father        prostate   Colon cancer Neg Hx    Social History   Socioeconomic History   Marital status: Widowed    Spouse name: Not on file   Number of children: Not on file   Years of education: Not on file   Highest education level: Not on file  Occupational History   Not on file  Tobacco Use   Smoking status: Never   Smokeless tobacco: Never  Vaping Use   Vaping Use: Never used  Substance and Sexual Activity   Alcohol use: Yes    Alcohol/week: 7.0 standard drinks of alcohol    Types: 7 Glasses of wine per week   Drug use: No   Sexual activity: Not Currently  Other Topics Concern   Not on file  Social History Narrative   Mother of Koleen Nimrod   Social Determinants of Health   Financial Resource Strain: Low Risk  (11/03/2020)   Overall Financial Resource Strain (CARDIA)    Difficulty of Paying Living Expenses: Not hard at all  Food Insecurity: No Food Insecurity (11/03/2020)   Hunger Vital Sign    Worried About Running Out of Food in the Last Year: Never true    Ran Out of Food in the Last Year: Never true  Transportation Needs: No Transportation Needs (11/03/2020)   PRAPARE - Hydrologist (Medical): No    Lack of Transportation (Non-Medical): No  Physical Activity: Insufficiently Active (11/06/2021)   Exercise  Vital Sign    Days of Exercise per Week: 7 days    Minutes of Exercise per Session: 20 min  Stress: No Stress Concern Present (11/06/2021)   [place]    Feeling of Stress : Not at all  Social Connections: Not on file    Tobacco Counseling Counseling given: Not Answered   Clinical Intake:  Pre-visit preparation completed: Yes  Pain : No/denies pain     Nutritional Risks: None Diabetes: No  How often do you need to have someone help you when you read instructions, pamphlets, or other written materials from your doctor or pharmacy?: 1 - Never  Diabetic?no  Interpreter Needed?: No  Information entered by :: Kirke Shaggy, LPN   Activities of Daily Living  12/22/2020   10:47 PM  In your present state of health, do you have any difficulty performing the following activities:  Hearing? 0  Vision? 0  Difficulty concentrating or making decisions? 0  Walking or climbing stairs? 0  Dressing or bathing? 0  Doing errands, shopping? 0    Patient Care Team: Owens Loffler, MD as PCP - General (Family Medicine) Leandrew Koyanagi, MD as Referring Physician (Ophthalmology) Dionisio David, MD as Consulting Physician (Cardiology) Verda Cumins, MD (Inactive) as Consulting Physician (Rehabilitation)  Indicate any recent Medical Services you may have received from other than [facility] providers in the past year (date may be approximate).     Assessment:   This is a routine wellness examination for Anne Ferrell.  Hearing/Vision screen No results found.  Dietary issues and exercise activities discussed:     Goals Addressed             This Visit's Progress    DIET - EAT MORE FRUITS AND VEGETABLES         Depression Screen    11/06/2021    9:08 AM 11/03/2020    9:10 AM 11/03/2019    9:06 AM 11/02/2018    9:23 AM 10/10/2017    8:50 AM 10/03/2016   10:16 AM 09/12/2015   10:23 AM  PHQ 2/9 Scores  PHQ - 2  Score 0 0 0 0 0 0 0  PHQ- 9 Score 0 0 0  0      Fall Risk    11/03/2020    9:10 AM 11/03/2019    9:06 AM 11/27/2018   11:57 AM 11/02/2018    9:23 AM 10/10/2017    8:50 AM  Fall Risk   Falls in the past year? 0 0 0 0 No  Comment   Emmi Telephone Survey: data to providers prior to load    Number falls in past yr: 0 0     Injury with Fall? 0 0     Risk for fall due to : Medication side effect Medication side effect     Follow up Falls evaluation completed;Falls prevention discussed Falls evaluation completed;Falls prevention discussed       FALL RISK PREVENTION PERTAINING TO THE HOME:  Any stairs in or around the home? Yes  If so, are there any without handrails? No  Home free of loose throw rugs in walkways, pet beds, electrical cords, etc? Yes  Adequate lighting in your home to reduce risk of falls? Yes   ASSISTIVE DEVICES UTILIZED TO PREVENT FALLS:  Life alert? No  Use of a cane, walker or w/c? Yes  Grab bars in the bathroom? Yes  Shower chair or bench in shower? Yes  Elevated toilet seat or a handicapped toilet? No   Cognitive Function: declined 2023     11/03/2020    9:12 AM 11/03/2019    9:08 AM 10/10/2017    8:52 AM 10/03/2016   10:16 AM 09/12/2015   11:00 AM  MMSE - Mini Mental State Exam  Orientation to time '5 5 5 5 5  '$ Orientation to Place '5 5 5 5 5  '$ Registration '3 3 3 3 3  '$ Attention/ Calculation 5 5 0 0 0  Recall '3 3 3 3 3  '$ Language- name 2 objects   0 0 0  Language- repeat '1 1 1 1 1  '$ Language- follow 3 step command   '3 3 3  '$ Language- read & follow direction   0 0 0  Write a sentence  0 0 0  Copy design   0 0 0  Total score   '20 20 20        '$ Immunizations Immunization History  Administered Date(s) Administered   Fluad Quad(high Dose 65+) 02/16/2019   Influenza,inj,Quad PF,6+ Mos 04/12/2013, 03/10/2014, 03/29/2015, 03/05/2016, 03/06/2017, 02/26/2018   Moderna SARS-COV2 Booster Vaccination 04/30/2020, 12/27/2020   PFIZER(Purple Top)SARS-COV-2 Vaccination  05/22/2019, 06/12/2019   Pneumococcal Conjugate-13 09/12/2015   Pneumococcal Polysaccharide-23 04/12/2013    TDAP status: Due, Education has been provided regarding the importance of this vaccine. Advised may receive this vaccine at local pharmacy or Health Dept. Aware to provide a copy of the vaccination record if obtained from local pharmacy or Health Dept. Verbalized acceptance and understanding.  Flu Vaccine status: Declined, Education has been provided regarding the importance of this vaccine but patient still declined. Advised may receive this vaccine at local pharmacy or Health Dept. Aware to provide a copy of the vaccination record if obtained from local pharmacy or Health Dept. Verbalized acceptance and understanding.  Pneumococcal vaccine status: Up to date  Covid-19 vaccine status: Completed vaccines  Qualifies for Shingles Vaccine? Yes   Zostavax completed No   Shingrix Completed?: No.    Education has been provided regarding the importance of this vaccine. Patient has been advised to call insurance company to determine out of pocket expense if they have not yet received this vaccine. Advised may also receive vaccine at local pharmacy or Health Dept. Verbalized acceptance and understanding.  Screening Tests Health Maintenance  Topic Date Due   Zoster Vaccines- Shingrix (1 of 2) Never done   COVID-19 Vaccine (3 - Pfizer risk series) 01/24/2021   INFLUENZA VACCINE  11/27/2021   TETANUS/TDAP  06/13/2025   Pneumonia Vaccine 68+ Years old  Completed   DEXA SCAN  Completed   HPV VACCINES  Aged Out    Health Maintenance  Health Maintenance Due  Topic Date Due   Zoster Vaccines- Shingrix (1 of 2) Never done   COVID-19 Vaccine (3 - Pfizer risk series) 01/24/2021    Colorectal cancer screening: No longer required.   Mammogram status: No longer required due to age.   Lung Cancer Screening: (Low Dose CT Chest recommended if Age 86-80 years, 30 pack-year currently smoking OR  have quit w/in 15years.) does not qualify.    Additional Screening:  Hepatitis C Screening: does not qualify; Completed no  Vision Screening: Recommended annual ophthalmology exams for early detection of glaucoma and other disorders of the eye. Is the patient up to date with their annual eye exam?  Yes  Who is the provider or what is the name of the office in which the patient attends annual eye exams? [facility] If pt is not established with a provider, would they like to be referred to a provider to establish care? No .   Dental Screening: Recommended annual dental exams for proper oral hygiene  Community Resource Referral / Chronic Care Management: CRR required this visit?  No   CCM required this visit?  No      Plan:     I have personally reviewed and noted the following in the patient's chart:   Medical and social history Use of alcohol, tobacco or illicit drugs  Current medications and supplements including opioid prescriptions.  Functional ability and status Nutritional status Physical activity Advanced directives List of other physicians Hospitalizations, surgeries, and ER visits in previous 12 months Vitals Screenings to include cognitive, depression, and falls Referrals and appointments  In  addition, I have reviewed and discussed with patient certain preventive protocols, quality metrics, and best practice recommendations. A written personalized care plan for preventive services as well as general preventive health recommendations were provided to patient.     Dionisio David, LPN   [id number]   Nurse Notes: none

## 2021-11-13 ENCOUNTER — Other Ambulatory Visit: Payer: Self-pay | Admitting: Family Medicine

## 2021-11-13 DIAGNOSIS — E785 Hyperlipidemia, unspecified: Secondary | ICD-10-CM

## 2021-11-13 DIAGNOSIS — M25562 Pain in left knee: Secondary | ICD-10-CM | POA: Diagnosis not present

## 2021-11-13 DIAGNOSIS — M170 Bilateral primary osteoarthritis of knee: Secondary | ICD-10-CM | POA: Diagnosis not present

## 2021-11-13 DIAGNOSIS — E559 Vitamin D deficiency, unspecified: Secondary | ICD-10-CM

## 2021-11-13 DIAGNOSIS — R7309 Other abnormal glucose: Secondary | ICD-10-CM

## 2021-11-13 DIAGNOSIS — E538 Deficiency of other specified B group vitamins: Secondary | ICD-10-CM

## 2021-11-13 DIAGNOSIS — Z131 Encounter for screening for diabetes mellitus: Secondary | ICD-10-CM

## 2021-11-13 DIAGNOSIS — Z79899 Other long term (current) drug therapy: Secondary | ICD-10-CM

## 2021-11-15 ENCOUNTER — Other Ambulatory Visit (INDEPENDENT_AMBULATORY_CARE_PROVIDER_SITE_OTHER): Payer: Medicare Other

## 2021-11-15 ENCOUNTER — Other Ambulatory Visit: Payer: Medicare Other

## 2021-11-15 DIAGNOSIS — E538 Deficiency of other specified B group vitamins: Secondary | ICD-10-CM | POA: Diagnosis not present

## 2021-11-15 DIAGNOSIS — Z79899 Other long term (current) drug therapy: Secondary | ICD-10-CM | POA: Diagnosis not present

## 2021-11-15 DIAGNOSIS — E559 Vitamin D deficiency, unspecified: Secondary | ICD-10-CM

## 2021-11-15 DIAGNOSIS — E785 Hyperlipidemia, unspecified: Secondary | ICD-10-CM | POA: Diagnosis not present

## 2021-11-15 DIAGNOSIS — R7309 Other abnormal glucose: Secondary | ICD-10-CM | POA: Diagnosis not present

## 2021-11-15 LAB — BASIC METABOLIC PANEL
BUN: 22 mg/dL (ref 6–23)
CO2: 27 mEq/L (ref 19–32)
Calcium: 9.2 mg/dL (ref 8.4–10.5)
Chloride: 104 mEq/L (ref 96–112)
Creatinine, Ser: 0.73 mg/dL (ref 0.40–1.20)
GFR: 73.97 mL/min (ref >60.00)
Glucose, Bld: 104 mg/dL — ABNORMAL HIGH (ref 70–99)
Potassium: 4.3 mEq/L (ref 3.5–5.1)
Sodium: 141 mEq/L (ref 135–145)

## 2021-11-15 LAB — HEPATIC FUNCTION PANEL
ALT: 21 U/L (ref 0–35)
AST: 27 U/L (ref 0–37)
Albumin: 4.5 g/dL (ref 3.5–5.2)
Alkaline Phosphatase: 76 U/L (ref 39–117)
Bilirubin, Direct: 0.1 mg/dL (ref 0.0–0.3)
Total Bilirubin: 0.8 mg/dL (ref 0.2–1.2)
Total Protein: 6.4 g/dL (ref 6.0–8.3)

## 2021-11-15 LAB — VITAMIN D 25 HYDROXY (VIT D DEFICIENCY, FRACTURES): VITD: 41.11 ng/mL (ref 30.00–100.00)

## 2021-11-15 LAB — TSH: TSH: 2.01 u[IU]/mL (ref 0.35–5.50)

## 2021-11-15 LAB — LIPID PANEL
Cholesterol: 212 mg/dL — ABNORMAL HIGH (ref 0–200)
HDL: 78.7 mg/dL (ref >39.00)
LDL Cholesterol: 119 mg/dL — ABNORMAL HIGH (ref 0–99)
NonHDL: 133.3
Total CHOL/HDL Ratio: 3
Triglycerides: 71 mg/dL (ref 0.0–149.0)
VLDL: 14.2 mg/dL (ref 0.0–40.0)

## 2021-11-15 LAB — CBC WITH DIFFERENTIAL/PLATELET
Basophils Absolute: 0.1 10*3/uL (ref 0.0–0.1)
Basophils Relative: 1.1 % (ref 0.0–3.0)
Eosinophils Absolute: 0 10*3/uL (ref 0.0–0.7)
Eosinophils Relative: 0.8 % (ref 0.0–5.0)
HCT: 42 % (ref 36.0–46.0)
Hemoglobin: 13.9 g/dL (ref 12.0–15.0)
Lymphocytes Relative: 12.2 % (ref 12.0–46.0)
Lymphs Abs: 0.7 10*3/uL (ref 0.7–4.0)
MCHC: 33.1 g/dL (ref 30.0–36.0)
MCV: 95.5 fl (ref 78.0–100.0)
Monocytes Absolute: 0.5 10*3/uL (ref 0.1–1.0)
Monocytes Relative: 8.2 % (ref 3.0–12.0)
Neutro Abs: 4.6 10*3/uL (ref 1.4–7.7)
Neutrophils Relative %: 77.7 % — ABNORMAL HIGH (ref 43.0–77.0)
Platelets: 237 10*3/uL (ref 150.0–400.0)
RBC: 4.4 Mil/uL (ref 3.87–5.11)
RDW: 13.5 % (ref 11.5–15.5)
WBC: 6 10*3/uL (ref 4.0–10.5)

## 2021-11-15 LAB — VITAMIN B12: Vitamin B-12: 609 pg/mL (ref 211–911)

## 2021-11-15 LAB — HEMOGLOBIN A1C: Hgb A1c MFr Bld: 6 % (ref 4.6–6.5)

## 2021-11-20 DIAGNOSIS — M170 Bilateral primary osteoarthritis of knee: Secondary | ICD-10-CM | POA: Diagnosis not present

## 2021-11-22 ENCOUNTER — Encounter: Payer: Self-pay | Admitting: Family Medicine

## 2021-11-22 ENCOUNTER — Ambulatory Visit (INDEPENDENT_AMBULATORY_CARE_PROVIDER_SITE_OTHER): Payer: Medicare Other | Admitting: Family Medicine

## 2021-11-22 VITALS — BP 130/80 | HR 65 | Temp 98.3°F | Ht 69.5 in | Wt 131.4 lb

## 2021-11-22 DIAGNOSIS — I10 Essential (primary) hypertension: Secondary | ICD-10-CM | POA: Diagnosis not present

## 2021-11-22 DIAGNOSIS — M810 Age-related osteoporosis without current pathological fracture: Secondary | ICD-10-CM

## 2021-11-22 DIAGNOSIS — E559 Vitamin D deficiency, unspecified: Secondary | ICD-10-CM

## 2021-11-22 DIAGNOSIS — I38 Endocarditis, valve unspecified: Secondary | ICD-10-CM | POA: Diagnosis not present

## 2021-11-22 NOTE — Progress Notes (Signed)
Taneah Masri T. Jillyn Stacey, MD, [place] at [facility] [place], [address]  Phone: [phone]  FAX: [phone]  Anne Ferrell - 88 y.o. female  MRN [id number]  Date of Birth: [birth year]  Date: 11/22/2021  PCP: Owens Loffler, MD  Referral: Owens Loffler, MD  Chief Complaint  Patient presents with   Annual Exam    Part 2   Subjective:   Anne Ferrell is a 88 y.o. very pleasant female patient with Body mass index is 19.12 kg/m. who presents with the following:  Generally healthy 88 year old female and she presents to follow-up regarding general health issues after her Medicare wellness exam.  HTN: Tolerating all medications without side effects Stable and at goal No CP, no sob. No HA.  BP Readings from Last 3 Encounters:  11/22/21 130/80  12/27/20 132/65  11/13/20 160/73    Basic Metabolic Panel:    Component Value Date/Time   NA 141 11/15/2021 0841   K 4.3 11/15/2021 0841   CL 104 11/15/2021 0841   CO2 27 11/15/2021 0841   BUN 22 11/15/2021 0841   CREATININE 0.73 11/15/2021 0841   GLUCOSE 104 (H) 11/15/2021 0841   CALCIUM 9.2 11/15/2021 0841    Osteoporosis, she did have a history of a right-sided hip fracture as well as a distal radius, ulna, and olecranon fracture, and all of this occurred roughly 1 year ago. Femoral neck T score was -3.1 on the left side in 2017.  Consider shingrix  Does still see Cardiology at [facility] Maintenance  Topic Date Due   Zoster Vaccines- Shingrix (1 of 2) Never done   COVID-19 Vaccine (3 - Pfizer risk series) 01/24/2021   INFLUENZA VACCINE  11/27/2021   TETANUS/TDAP  06/13/2025   Pneumonia Vaccine 41+ Years old  Completed   DEXA SCAN  Completed   HPV VACCINES  Aged Out    Immunization History  Administered Date(s) Administered   Fluad Quad(high Dose 65+) 02/16/2019   Influenza,inj,Quad PF,6+ Mos 04/12/2013, 03/10/2014, 03/29/2015, 03/05/2016,  03/06/2017, 02/26/2018   Moderna SARS-COV2 Booster Vaccination 04/30/2020, 12/27/2020   PFIZER(Purple Top)SARS-COV-2 Vaccination 05/22/2019, 06/12/2019   Pneumococcal Conjugate-13 09/12/2015   Pneumococcal Polysaccharide-23 04/12/2013     Review of Systems is noted in the HPI, as appropriate  Objective:   BP 130/80   Pulse 65   Temp 98.3 F (36.8 C) (Oral)   Ht 5' 9.5" (1.765 m)   Wt 131 lb 6 oz (59.6 kg)   SpO2 96%   BMI 19.12 kg/m   GEN: No acute distress; alert,appropriate. PULM: Breathing comfortably in no respiratory distress PSYCH: Normally interactive.  CV: RRR, no m/g/r  PULM: Normal respiratory rate, no accessory muscle use. No wheezes, crackles or rhonchi   Laboratory and Imaging Data: Results for orders placed or performed in visit on 11/15/21  VITAMIN D 25 Hydroxy (Vit-D Deficiency, Fractures)  Result Value Ref Range   VITD 41.11 30.00 - 100.00 ng/mL  Lipid panel  Result Value Ref Range   Cholesterol 212 (H) 0 - 200 mg/dL   Triglycerides 71.0 0.0 - 149.0 mg/dL   HDL 78.70 >39.00 mg/dL   VLDL 14.2 0.0 - 40.0 mg/dL   LDL Cholesterol 119 (H) 0 - 99 mg/dL   Total CHOL/HDL Ratio 3    NonHDL 133.30   Hepatic function panel  Result Value Ref Range   Total Bilirubin 0.8 0.2 - 1.2 mg/dL   Bilirubin, Direct 0.1 0.0 - 0.3 mg/dL  Alkaline Phosphatase 76 39 - 117 U/L   AST 27 0 - 37 U/L   ALT 21 0 - 35 U/L   Total Protein 6.4 6.0 - 8.3 g/dL   Albumin 4.5 3.5 - 5.2 g/dL  Basic metabolic panel  Result Value Ref Range   Sodium 141 135 - 145 mEq/L   Potassium 4.3 3.5 - 5.1 mEq/L   Chloride 104 96 - 112 mEq/L   CO2 27 19 - 32 mEq/L   Glucose, Bld 104 (H) 70 - 99 mg/dL   BUN 22 6 - 23 mg/dL   Creatinine, Ser 0.73 0.40 - 1.20 mg/dL   GFR 73.97 >60.00 mL/min   Calcium 9.2 8.4 - 10.5 mg/dL  CBC with Differential/Platelet  Result Value Ref Range   WBC 6.0 4.0 - 10.5 K/uL   RBC 4.40 3.87 - 5.11 Mil/uL   Hemoglobin 13.9 12.0 - 15.0 g/dL   HCT 42.0 36.0 - 46.0 %    MCV 95.5 78.0 - 100.0 fl   MCHC 33.1 30.0 - 36.0 g/dL   RDW 13.5 11.5 - 15.5 %   Platelets 237.0 150.0 - 400.0 K/uL   Neutrophils Relative % 77.7 (H) 43.0 - 77.0 %   Lymphocytes Relative 12.2 12.0 - 46.0 %   Monocytes Relative 8.2 3.0 - 12.0 %   Eosinophils Relative 0.8 0.0 - 5.0 %   Basophils Relative 1.1 0.0 - 3.0 %   Neutro Abs 4.6 1.4 - 7.7 K/uL   Lymphs Abs 0.7 0.7 - 4.0 K/uL   Monocytes Absolute 0.5 0.1 - 1.0 K/uL   Eosinophils Absolute 0.0 0.0 - 0.7 K/uL   Basophils Absolute 0.1 0.0 - 0.1 K/uL  TSH  Result Value Ref Range   TSH 2.01 0.35 - 5.50 uIU/mL  Hemoglobin A1c  Result Value Ref Range   Hgb A1c MFr Bld 6.0 4.6 - 6.5 %  Vitamin B12  Result Value Ref Range   Vitamin B-12 609 211 - 911 pg/mL     Assessment and Plan:     ICD-10-CM   1. Essential hypertension  I10     2. Vitamin D deficiency  E55.9     3. Valvular heart disease  I38     4. Age-related osteoporosis without current pathological fracture  M81.0      Total encounter time: 30 minutes. This includes total time spent on the day of encounter.  Additional time spent on reviewing health maintenance and reviewing information from the patient's Medicare wellness exam.  Blood pressure stable.  Vitamin D levels are stable.  Reviewed cardiology notes.  Osteoporosis with femoral neck fracture as well as wrist fractures last year.  At this point she has no interest in going on any bisphosphonates, and she does not want to repeat her DEXA scan.  She will get her COVID-vaccine, but she is not interested in getting Shingrix vaccine.  Medication Management during today's office visit: No orders of the defined types were placed in this encounter.  Medications Discontinued During This Encounter  Medication Reason   docusate sodium (COLACE) 100 MG capsule Completed Course   ferrous sulfate 325 (65 FE) MG tablet Completed Course   furosemide (LASIX) 20 MG tablet Completed Course   feeding supplement (ENSURE  SURGERY) LIQD Completed Course   methocarbamol (ROBAXIN) 500 MG tablet No longer needed (for PRN medications)    Orders placed today for conditions managed today: No orders of the defined types were placed in this encounter.   Follow-up if needed: No  follow-ups on file.  [employer] One speech-to-text software was used for transcription in this dictation.  Possible transcriptional errors can occur using Editor, commissioning.   Signed,  Maud Deed. Aleksey Newbern, MD   Outpatient Encounter Medications as of 11/22/2021  Medication Sig   atenolol (TENORMIN) 25 MG tablet Take 1 tablet by mouth once daily   calcium carbonate (OS-CAL - DOSED IN MG OF ELEMENTAL CALCIUM) 1250 (500 Ca) MG tablet Take 1 tablet (500 mg of elemental calcium total) by mouth daily with breakfast.   Magnesium 250 MG TABS Take 2 tablets by mouth daily.   Multiple Vitamins-Minerals (PRESERVISION AREDS) CAPS Take 1 capsule by mouth daily.   VITAMIN A PO Take 2 tablets by mouth daily. 10,000 units   vitamin B-12 (CYANOCOBALAMIN) 1000 MCG tablet Take 1,000 mcg by mouth daily.   Vitamin D, Cholecalciferol, 25 MCG (1000 UT) TABS Take 1 tablet by mouth daily.   [DISCONTINUED] docusate sodium (COLACE) 100 MG capsule Take 1 capsule (100 mg total) by mouth 2 (two) times daily.   [DISCONTINUED] feeding supplement (ENSURE SURGERY) LIQD Take 237 mLs by mouth 2 (two) times daily between meals.   [DISCONTINUED] ferrous sulfate 325 (65 FE) MG tablet Take 1 tablet (325 mg total) by mouth daily with breakfast.   [DISCONTINUED] furosemide (LASIX) 20 MG tablet Take by mouth.   [DISCONTINUED] methocarbamol (ROBAXIN) 500 MG tablet Take 1 tablet (500 mg total) by mouth every 6 (six) hours as needed for muscle spasms.   No facility-administered encounter medications on file as of 11/22/2021.

## 2021-11-22 NOTE — Patient Instructions (Addendum)
Consider the [facility].   Covid bivalent booster

## 2021-11-27 DIAGNOSIS — M170 Bilateral primary osteoarthritis of knee: Secondary | ICD-10-CM | POA: Diagnosis not present

## 2021-12-28 DIAGNOSIS — H353132 Nonexudative age-related macular degeneration, bilateral, intermediate dry stage: Secondary | ICD-10-CM | POA: Diagnosis not present

## 2022-01-07 DIAGNOSIS — H401112 Primary open-angle glaucoma, right eye, moderate stage: Secondary | ICD-10-CM | POA: Diagnosis not present

## 2022-01-10 DIAGNOSIS — I10 Essential (primary) hypertension: Secondary | ICD-10-CM | POA: Diagnosis not present

## 2022-01-10 DIAGNOSIS — R000 Tachycardia, unspecified: Secondary | ICD-10-CM | POA: Diagnosis not present

## 2022-02-02 DIAGNOSIS — Z23 Encounter for immunization: Secondary | ICD-10-CM | POA: Diagnosis not present

## 2022-02-18 DIAGNOSIS — H2512 Age-related nuclear cataract, left eye: Secondary | ICD-10-CM | POA: Diagnosis not present

## 2022-02-20 ENCOUNTER — Encounter: Payer: Self-pay | Admitting: Ophthalmology

## 2022-02-21 DIAGNOSIS — I358 Other nonrheumatic aortic valve disorders: Secondary | ICD-10-CM | POA: Diagnosis not present

## 2022-02-21 DIAGNOSIS — I10 Essential (primary) hypertension: Secondary | ICD-10-CM | POA: Diagnosis not present

## 2022-02-21 DIAGNOSIS — R000 Tachycardia, unspecified: Secondary | ICD-10-CM | POA: Diagnosis not present

## 2022-02-25 NOTE — Discharge Instructions (Signed)

## 2022-02-27 ENCOUNTER — Other Ambulatory Visit: Payer: Self-pay

## 2022-02-27 ENCOUNTER — Encounter: Payer: Self-pay | Admitting: Ophthalmology

## 2022-02-27 ENCOUNTER — Encounter
Admission: RE | Disposition: A | Discharge status: Home / Self Care | Payer: Self-pay | Source: Home / Self Care | Attending: Ophthalmology

## 2022-02-27 ENCOUNTER — Ambulatory Visit: Payer: Medicare Other | Admitting: Anesthesiology

## 2022-02-27 ENCOUNTER — Ambulatory Visit
Admission: RE | Admit: 2022-02-27 | Discharge: 2022-02-27 | Disposition: A | Discharge status: Home / Self Care | Payer: Medicare Other | Attending: Ophthalmology | Admitting: Ophthalmology

## 2022-02-27 DIAGNOSIS — H401111 Primary open-angle glaucoma, right eye, mild stage: Secondary | ICD-10-CM | POA: Diagnosis not present

## 2022-02-27 DIAGNOSIS — H401112 Primary open-angle glaucoma, right eye, moderate stage: Secondary | ICD-10-CM | POA: Diagnosis not present

## 2022-02-27 DIAGNOSIS — H2511 Age-related nuclear cataract, right eye: Secondary | ICD-10-CM | POA: Diagnosis not present

## 2022-02-27 DIAGNOSIS — E119 Type 2 diabetes mellitus without complications: Secondary | ICD-10-CM | POA: Diagnosis not present

## 2022-02-27 DIAGNOSIS — I10 Essential (primary) hypertension: Secondary | ICD-10-CM | POA: Insufficient documentation

## 2022-02-27 DIAGNOSIS — T7840XA Allergy, unspecified, initial encounter: Secondary | ICD-10-CM | POA: Diagnosis not present

## 2022-02-27 HISTORY — PX: CATARACT EXTRACTION W/PHACO: SHX586

## 2022-02-27 HISTORY — DX: Unspecified osteoarthritis, unspecified site: M19.90

## 2022-02-27 SURGERY — PHACOEMULSIFICATION, CATARACT, WITH IOL INSERTION
Anesthesia: Monitor Anesthesia Care | Site: Eye | Laterality: Right

## 2022-02-27 MED ORDER — FENTANYL CITRATE (PF) 100 MCG/2ML IJ SOLN
INTRAMUSCULAR | Status: DC | PRN
  Administered 2022-02-27: 25 ug via INTRAVENOUS

## 2022-02-27 MED ORDER — SIGHTPATH DOSE#1 BSS IO SOLN
INTRAOCULAR | Status: DC | PRN
  Administered 2022-02-27: 31 mL via OPHTHALMIC

## 2022-02-27 MED ORDER — SIGHTPATH DOSE#1 NA HYALUR & NA CHOND-NA HYALUR IO KIT
PACK | INTRAOCULAR | Status: DC | PRN
  Administered 2022-02-27: 1 via OPHTHALMIC

## 2022-02-27 MED ORDER — LACTATED RINGERS IV SOLN: INTRAVENOUS | Status: DC

## 2022-02-27 MED ORDER — MIDAZOLAM HCL 2 MG/2ML IJ SOLN
INTRAMUSCULAR | Status: DC | PRN
  Administered 2022-02-27: .5 mg via INTRAVENOUS

## 2022-02-27 MED ORDER — CEFUROXIME OPHTHALMIC INJECTION 1 MG/0.1 ML
INJECTION | OPHTHALMIC | Status: DC | PRN
  Administered 2022-02-27: 0.1 mL via INTRACAMERAL

## 2022-02-27 MED ORDER — SIGHTPATH DOSE#1 BSS IO SOLN
INTRAOCULAR | Status: DC | PRN
  Administered 2022-02-27: 15 mL

## 2022-02-27 MED ORDER — TRYPAN BLUE 0.06 % IO SOSY
PREFILLED_SYRINGE | INTRAOCULAR | Status: DC | PRN
  Administered 2022-02-27: .2 mL via INTRAOCULAR

## 2022-02-27 MED ORDER — ONDANSETRON HCL 4 MG/2ML IJ SOLN: 4.0000 mg | Freq: Once | INTRAMUSCULAR | Status: DC | PRN

## 2022-02-27 MED ORDER — NA CHONDROIT SULF-NA HYALURON 40-30 MG/ML IO SOSY
INTRAOCULAR | Status: DC | PRN
  Administered 2022-02-27: 0.5 mL via INTRAOCULAR

## 2022-02-27 MED ORDER — TETRACAINE HCL 0.5 % OP SOLN
1.0000 [drp] | OPHTHALMIC | Status: DC | PRN
  Administered 2022-02-27: 1 [drp] via OPHTHALMIC

## 2022-02-27 MED ORDER — FENTANYL CITRATE PF 50 MCG/ML IJ SOSY: 25.0000 ug | PREFILLED_SYRINGE | INTRAMUSCULAR | Status: DC | PRN

## 2022-02-27 MED ORDER — SIGHTPATH DOSE#1 BSS IO SOLN
INTRAOCULAR | Status: DC | PRN
  Administered 2022-02-27: 1 mL via INTRAMUSCULAR

## 2022-02-27 SURGICAL SUPPLY — 13 items
BLADE DUAL KAHOOK SINGLE USE (BLADE) IMPLANT
CANNULA ANT/CHMB 27G (MISCELLANEOUS) IMPLANT
CANNULA ANT/CHMB 27GA (MISCELLANEOUS) ×1 IMPLANT
CATARACT SUITE SIGHTPATH (MISCELLANEOUS) ×1 IMPLANT
FEE CATARACT SUITE SIGHTPATH (MISCELLANEOUS) ×1 IMPLANT
GLOVE SRG 8 PF TXTR STRL LF DI (GLOVE) ×1 IMPLANT
GLOVE SURG ENC TEXT LTX SZ7.5 (GLOVE) ×1 IMPLANT
GLOVE SURG UNDER POLY LF SZ8 (GLOVE) ×1
ICLIP (OPHTHALMIC RELATED) IMPLANT
NDL FILTER BLUNT 18X1 1/2 (NEEDLE) ×1 IMPLANT
NEEDLE FILTER BLUNT 18X1 1/2 (NEEDLE) ×1 IMPLANT
SYR 3ML LL SCALE MARK (SYRINGE) ×1 IMPLANT
WATER STERILE IRR 250ML POUR (IV SOLUTION) ×1 IMPLANT

## 2022-02-27 NOTE — H&P (Signed)
[facility]   Primary Care Physician:  Owens Loffler, MD Ophthalmologist: Dr. Leandrew Koyanagi  Pre-Procedure History & Physical: HPI:  Anne Ferrell is a 88 y.o. female here for ophthalmic surgery.   Past Medical History:  Diagnosis Date   Diverticulitis    Glaucoma    High blood pressure    Osteoarthritis    knees   Osteoporosis 10/05/2015   Vitamin D deficiency 09/18/2015    Past Surgical History:  Procedure Laterality Date   ABDOMINAL HYSTERECTOMY  2008   cataract surgery Bilateral 2011   CLOSED REDUCTION WRIST FRACTURE Right 12/23/2020   Procedure: CLOSED REDUCTION AND PERCUTANEOUS PINNING RIGHT WRIST;  Surgeon: Earnestine Leys, MD;  Location: [facility] ORS;  Service: Orthopedics;  Laterality: Right;   HIP ARTHROPLASTY Right 12/23/2020   Procedure: ARTHROPLASTY BIPOLAR HIP (HEMIARTHROPLASTY);  Surgeon: Earnestine Leys, MD;  Location: [facility] ORS;  Service: Orthopedics;  Laterality: Right;   ORIF ELBOW FRACTURE Right 12/23/2020   Procedure: OPEN REDUCTION INTERNAL FIXATION (ORIF) ELBOW/OLECRANON FRACTURE;  Surgeon: Earnestine Leys, MD;  Location: [facility] ORS;  Service: Orthopedics;  Laterality: Right;   TONSILLECTOMY AND ADENOIDECTOMY  1939    Prior to Admission medications   Medication Sig Start Date End Date Taking? Authorizing Provider  atenolol (TENORMIN) 25 MG tablet Take 1 tablet by mouth once daily 01/12/20  Yes Copland, Frederico Hamman, MD  calcium carbonate (OS-CAL - DOSED IN MG OF ELEMENTAL CALCIUM) 1250 (500 Ca) MG tablet Take 1 tablet (500 mg of elemental calcium total) by mouth daily with breakfast. 12/28/20  Yes Enzo Bi, MD  Magnesium 250 MG TABS Take 2 tablets by mouth daily.   Yes [provider]  Multiple Vitamins-Minerals (PRESERVISION AREDS) CAPS Take 1 capsule by mouth daily.   Yes [provider]  VITAMIN A PO Take 2 tablets by mouth daily. 10,000 units   Yes [provider]  vitamin B-12 (CYANOCOBALAMIN) 1000 MCG tablet Take 1,000  mcg by mouth daily.   Yes [provider]  Vitamin D, Cholecalciferol, 25 MCG (1000 UT) TABS Take 1 tablet by mouth daily.   Yes [provider]    Allergies as of 01/11/2022 - Review Complete 11/22/2021  Allergen Reaction Noted   Anise flavor Other (See Comments) 04/12/2013   Ciprofloxacin  12/22/2020   Codeine  11/22/2021   Levofloxacin  12/22/2020   Percocet [oxycodone-acetaminophen]  59/93/5701   Licorice flavor [flavoring agent] Palpitations 04/12/2013    Family History  Problem Relation Age of Onset   Cancer Father        prostate   Colon cancer Neg Hx     Social History   Socioeconomic History   Marital status: Widowed    Spouse name: Not on file   Number of children: Not on file   Years of education: Not on file   Highest education level: Not on file  Occupational History   Not on file  Tobacco Use   Smoking status: Never   Smokeless tobacco: Never  Vaping Use   Vaping Use: Never used  Substance and Sexual Activity   Alcohol use: Yes    Alcohol/week: 7.0 standard drinks of alcohol    Types: 7 Glasses of wine per week   Drug use: No   Sexual activity: Not Currently  Other Topics Concern   Not on file  Social History Narrative   Mother of Koleen Nimrod   Social Determinants of Health   Financial Resource Strain: Low Risk  (11/06/2021)   Overall Emergency planning/management officer Strain (  CARDIA)    Difficulty of Paying Living Expenses: Not hard at all  Food Insecurity: No Food Insecurity (11/06/2021)   Hunger Vital Sign    Worried About Running Out of Food in the Last Year: Never true    Ran Out of Food in the Last Year: Never true  Transportation Needs: No Transportation Needs (11/06/2021)   PRAPARE - Hydrologist (Medical): No    Lack of Transportation (Non-Medical): No  Physical Activity: Insufficiently Active (11/06/2021)   Exercise Vital Sign    Days of Exercise per Week: 7 days    Minutes of Exercise per Session: 20  min  Stress: No Stress Concern Present (11/06/2021)   [place]    Feeling of Stress : Not at all  Social Connections: Socially Isolated (11/06/2021)   Social Connection and Isolation Panel [NHANES]    Frequency of Communication with Friends and Family: More than three times a week    Frequency of Social Gatherings with Friends and Family: Three times a week    Attends Religious Services: Never    Active Member of Clubs or Organizations: No    Attends Archivist Meetings: Never    Marital Status: Widowed  Intimate Partner Violence: Not At Risk (11/06/2021)   Humiliation, Afraid, Rape, and Kick questionnaire    Fear of Current or Ex-Partner: No    Emotionally Abused: No    Physically Abused: No    Sexually Abused: No    Review of Systems: See HPI, otherwise negative ROS  Physical Exam: BP (!) 149/78   Pulse 68   Temp 99.4 F (37.4 C)   Ht '5\' 9"'$  (1.753 m)   Wt 60 kg   SpO2 99%   BMI 19.53 kg/m  General:   Alert,  pleasant and cooperative in NAD Head:  Normocephalic and atraumatic. Lungs:  Clear to auscultation.    Heart:  Regular rate and rhythm.   Impression/Plan: Anne Ferrell is here for ophthalmic surgery.  Risks, benefits, limitations, and alternatives regarding ophthalmic surgery have been reviewed with the patient.  Questions have been answered.  All parties agreeable.   Leandrew Koyanagi, MD  02/27/2022, 10:42 AM

## 2022-02-27 NOTE — Anesthesia Preprocedure Evaluation (Signed)
Anesthesia Evaluation  Patient identified by MRN, date of birth, ID band Patient awake    Reviewed: Allergy & Precautions, H&P , NPO status , Patient's Chart, lab work & pertinent test results, reviewed documented beta blocker date and time   Airway Mallampati: II  TM Distance: >3 FB Neck ROM: full    Dental no notable dental hx. (+) Teeth Intact   Pulmonary neg pulmonary ROS,    Pulmonary exam normal breath sounds clear to auscultation       Cardiovascular Exercise Tolerance: Good hypertension, On Medications negative cardio ROS   Rhythm:regular Rate:Normal     Neuro/Psych negative neurological ROS  negative psych ROS   GI/Hepatic negative GI ROS, Neg liver ROS,   Endo/Other  negative endocrine ROSdiabetes, Well Controlled  Renal/GU      Musculoskeletal   Abdominal   Peds  Hematology negative hematology ROS (+)   Anesthesia Other Findings   Reproductive/Obstetrics negative OB ROS                             Anesthesia Physical Anesthesia Plan  ASA: 2  Anesthesia Plan: MAC   Post-op Pain Management:    Induction:   PONV Risk Score and Plan:   Airway Management Planned:   Additional Equipment:   Intra-op Plan:   Post-operative Plan:   Informed Consent: I have reviewed the patients History and Physical, chart, labs and discussed the procedure including the risks, benefits and alternatives for the proposed anesthesia with the patient or authorized representative who has indicated his/her understanding and acceptance.       Plan Discussed with: CRNA  Anesthesia Plan Comments:         Anesthesia Quick Evaluation

## 2022-02-27 NOTE — Anesthesia Postprocedure Evaluation (Signed)
Anesthesia Post Note  Patient: Anne Ferrell  Procedure(s) Performed: [place] DUAL BLADE GONIOTOMY RIGHT (Right: Eye)  Patient location during evaluation: PACU Anesthesia Type: MAC Level of consciousness: awake and alert Pain management: pain level controlled Vital Signs Assessment: post-procedure vital signs reviewed and stable Respiratory status: spontaneous breathing, nonlabored ventilation, respiratory function stable and patient connected to nasal cannula oxygen Cardiovascular status: stable and blood pressure returned to baseline Postop Assessment: no apparent nausea or vomiting Anesthetic complications: no   No notable events documented.   Last Vitals:  Vitals:   02/27/22 1200 02/27/22 1210  BP: 130/75 (!) 140/77  Pulse: 60 (!) 57  Resp: 16 14  Temp: 36.7 C   SpO2: 100% 98%    Last Pain: There were no vitals filed for this visit.               Molli Barrows

## 2022-02-27 NOTE — Transfer of Care (Signed)
Immediate Anesthesia Transfer of Care Note  Patient: Anne Ferrell  Procedure(s) Performed: [place] DUAL BLADE GONIOTOMY RIGHT (Right: Eye)  Patient Location: PACU  Anesthesia Type: MAC  Level of Consciousness: awake, alert  and patient cooperative  Airway and Oxygen Therapy: Patient Spontanous Breathing and Patient connected to supplemental oxygen  Post-op Assessment: Post-op Vital signs reviewed, Patient's Cardiovascular Status Stable, Respiratory Function Stable, Patent Airway and No signs of Nausea or vomiting  Post-op Vital Signs: Reviewed and stable  Complications: No notable events documented.

## 2022-02-27 NOTE — Op Note (Signed)
PREOPERATIVE DIAGNOSIS:    moderate stage Primary Open Angle Glaucoma right eye H40.1112  POSTOPERATIVE DIAGNOSIS:        moderate  stage Primary Open Angle Glaucoma right eye Y04.5997  PROCEDURE:  [place] Dual Blade goniotomy right eye  Procedure(s): [place] DUAL BLADE GONIOTOMY RIGHT (Right)  SURGEON:  Wyonia Hough, MD   ANESTHESIA:  Topical with tetracaine drops augmented with 1% preservative-free intracameral lidocaine.    COMPLICATIONS:  None.   DESCRIPTION OF PROCEDURE:  The patient was identified in the holding room and transported to the operating room and placed in the supine position under the operating microscope.  The right eye was identified as the operative eye and it was prepped and draped in the usual sterile ophthalmic fashion.   A 1 millimeter clear-corneal paracentesis was made at the 12:00 position.  0.5 ml of preservative-free 1% lidocaine was injected into the anterior chamber.  The anterior chamber was filled with Viscoat viscoelastic.  A 2.4 millimeter keratome was used to make a near-clear corneal incision at the 9:00 position. The microscope was adjusted and a gonioprism was used to visulaize the trabecular meshwork.  The [facility] Dual Blade was advanced across the anterior chamber under viscoelastic.  The blade was used to mark the trabecular meshwork at the 1:30 position.  The blade was placed two clock hours clockwise into the meshwork.  Proper postioning was confirmed.  The blade ws passed counterclockwise through the meshwork to excise approximately two to three clock-hours of trabecular meshwork.   he remaining viscoelastic was aspirated.  Wounds were hydrated with balanced salt solution.  The anterior chamber was inflated to a physiologic pressure with balanced salt solution.  No wound leaks were noted. Cefuroxime 0.1 ml of a '10mg'$ /ml solution was injected into the anterior chamber for a dose of 1 mg of intracameral antibiotic at the completion of the  case.  The patient was taken to the recovery room in stable condition without complications of anesthesia or surgery.

## 2022-02-28 ENCOUNTER — Encounter: Payer: Self-pay | Admitting: Ophthalmology

## 2022-04-07 DIAGNOSIS — Z23 Encounter for immunization: Secondary | ICD-10-CM | POA: Diagnosis not present

## 2022-05-27 DIAGNOSIS — H353132 Nonexudative age-related macular degeneration, bilateral, intermediate dry stage: Secondary | ICD-10-CM | POA: Diagnosis not present

## 2022-05-27 DIAGNOSIS — H401121 Primary open-angle glaucoma, left eye, mild stage: Secondary | ICD-10-CM | POA: Diagnosis not present

## 2022-05-27 DIAGNOSIS — Z961 Presence of intraocular lens: Secondary | ICD-10-CM | POA: Diagnosis not present

## 2022-05-27 DIAGNOSIS — H401112 Primary open-angle glaucoma, right eye, moderate stage: Secondary | ICD-10-CM | POA: Diagnosis not present

## 2022-05-28 DIAGNOSIS — R000 Tachycardia, unspecified: Secondary | ICD-10-CM | POA: Diagnosis not present

## 2022-05-28 DIAGNOSIS — I38 Endocarditis, valve unspecified: Secondary | ICD-10-CM | POA: Diagnosis not present

## 2022-05-28 DIAGNOSIS — I358 Other nonrheumatic aortic valve disorders: Secondary | ICD-10-CM | POA: Diagnosis not present

## 2022-05-28 DIAGNOSIS — I10 Essential (primary) hypertension: Secondary | ICD-10-CM | POA: Diagnosis not present

## 2022-09-05 DIAGNOSIS — R42 Dizziness and giddiness: Secondary | ICD-10-CM | POA: Diagnosis not present

## 2022-09-05 DIAGNOSIS — I38 Endocarditis, valve unspecified: Secondary | ICD-10-CM | POA: Diagnosis not present

## 2022-09-05 DIAGNOSIS — I10 Essential (primary) hypertension: Secondary | ICD-10-CM | POA: Diagnosis not present

## 2022-09-05 DIAGNOSIS — R000 Tachycardia, unspecified: Secondary | ICD-10-CM | POA: Diagnosis not present

## 2022-09-05 DIAGNOSIS — R55 Syncope and collapse: Secondary | ICD-10-CM | POA: Diagnosis not present

## 2022-09-05 DIAGNOSIS — Q238 Other congenital malformations of aortic and mitral valves: Secondary | ICD-10-CM | POA: Diagnosis not present

## 2022-09-05 DIAGNOSIS — I358 Other nonrheumatic aortic valve disorders: Secondary | ICD-10-CM | POA: Diagnosis not present

## 2022-09-13 DIAGNOSIS — I358 Other nonrheumatic aortic valve disorders: Secondary | ICD-10-CM | POA: Diagnosis not present

## 2022-09-13 DIAGNOSIS — Q238 Other congenital malformations of aortic and mitral valves: Secondary | ICD-10-CM | POA: Diagnosis not present

## 2022-09-13 DIAGNOSIS — I6523 Occlusion and stenosis of bilateral carotid arteries: Secondary | ICD-10-CM | POA: Diagnosis not present

## 2022-09-13 DIAGNOSIS — R42 Dizziness and giddiness: Secondary | ICD-10-CM | POA: Diagnosis not present

## 2022-09-13 DIAGNOSIS — I771 Stricture of artery: Secondary | ICD-10-CM | POA: Diagnosis not present

## 2022-09-13 DIAGNOSIS — I38 Endocarditis, valve unspecified: Secondary | ICD-10-CM | POA: Diagnosis not present

## 2022-09-16 DIAGNOSIS — I10 Essential (primary) hypertension: Secondary | ICD-10-CM | POA: Diagnosis not present

## 2022-09-26 DIAGNOSIS — R000 Tachycardia, unspecified: Secondary | ICD-10-CM | POA: Diagnosis not present

## 2022-09-26 DIAGNOSIS — Q238 Other congenital malformations of aortic and mitral valves: Secondary | ICD-10-CM | POA: Diagnosis not present

## 2022-09-26 DIAGNOSIS — R011 Cardiac murmur, unspecified: Secondary | ICD-10-CM | POA: Diagnosis not present

## 2022-09-26 DIAGNOSIS — I358 Other nonrheumatic aortic valve disorders: Secondary | ICD-10-CM | POA: Diagnosis not present

## 2022-09-26 DIAGNOSIS — I10 Essential (primary) hypertension: Secondary | ICD-10-CM | POA: Diagnosis not present

## 2022-09-30 DIAGNOSIS — H353132 Nonexudative age-related macular degeneration, bilateral, intermediate dry stage: Secondary | ICD-10-CM | POA: Diagnosis not present

## 2022-09-30 DIAGNOSIS — Z961 Presence of intraocular lens: Secondary | ICD-10-CM | POA: Diagnosis not present

## 2022-09-30 DIAGNOSIS — H401121 Primary open-angle glaucoma, left eye, mild stage: Secondary | ICD-10-CM | POA: Diagnosis not present

## 2022-09-30 DIAGNOSIS — H401112 Primary open-angle glaucoma, right eye, moderate stage: Secondary | ICD-10-CM | POA: Diagnosis not present

## 2022-10-11 DIAGNOSIS — D2271 Melanocytic nevi of right lower limb, including hip: Secondary | ICD-10-CM | POA: Diagnosis not present

## 2022-10-11 DIAGNOSIS — Z85828 Personal history of other malignant neoplasm of skin: Secondary | ICD-10-CM | POA: Diagnosis not present

## 2022-10-11 DIAGNOSIS — L820 Inflamed seborrheic keratosis: Secondary | ICD-10-CM | POA: Diagnosis not present

## 2022-10-11 DIAGNOSIS — L538 Other specified erythematous conditions: Secondary | ICD-10-CM | POA: Diagnosis not present

## 2022-10-11 DIAGNOSIS — D2261 Melanocytic nevi of right upper limb, including shoulder: Secondary | ICD-10-CM | POA: Diagnosis not present

## 2022-10-11 DIAGNOSIS — D2272 Melanocytic nevi of left lower limb, including hip: Secondary | ICD-10-CM | POA: Diagnosis not present

## 2022-10-11 DIAGNOSIS — L298 Other pruritus: Secondary | ICD-10-CM | POA: Diagnosis not present

## 2022-10-11 DIAGNOSIS — D2262 Melanocytic nevi of left upper limb, including shoulder: Secondary | ICD-10-CM | POA: Diagnosis not present

## 2022-10-11 DIAGNOSIS — D692 Other nonthrombocytopenic purpura: Secondary | ICD-10-CM | POA: Diagnosis not present

## 2022-10-21 DIAGNOSIS — I358 Other nonrheumatic aortic valve disorders: Secondary | ICD-10-CM | POA: Diagnosis not present

## 2022-10-21 DIAGNOSIS — I38 Endocarditis, valve unspecified: Secondary | ICD-10-CM | POA: Diagnosis not present

## 2022-10-21 DIAGNOSIS — R000 Tachycardia, unspecified: Secondary | ICD-10-CM | POA: Diagnosis not present

## 2022-10-21 DIAGNOSIS — R55 Syncope and collapse: Secondary | ICD-10-CM | POA: Diagnosis not present

## 2022-10-21 DIAGNOSIS — I10 Essential (primary) hypertension: Secondary | ICD-10-CM | POA: Diagnosis not present

## 2022-10-21 DIAGNOSIS — R42 Dizziness and giddiness: Secondary | ICD-10-CM | POA: Diagnosis not present

## 2022-11-11 ENCOUNTER — Ambulatory Visit: Payer: Medicare Other

## 2022-11-11 VITALS — BP 125/75 | HR 89 | Ht 69.5 in | Wt 125.0 lb

## 2022-11-11 DIAGNOSIS — Z0000 Encounter for general adult medical examination without abnormal findings: Secondary | ICD-10-CM

## 2022-11-11 NOTE — Patient Instructions (Signed)
Anne Ferrell , Thank you for taking time to come for your Medicare Wellness Visit. I appreciate your ongoing commitment to your health goals. Please review the following plan we discussed and let me know if I can assist you in the future.   These are the goals we discussed:  Goals      DIET - EAT MORE FRUITS AND VEGETABLES     Increase physical activity     Starting 10/03/16, I will continue to walk at least 30 min 3 days per week.      Increase physical activity     Starting 10/10/2017, I will continue to walk at least 30 minutes 5 days per week.      Patient Stated     11/03/2019, I will continue to walk everyday for 20 minutes.      Patient Stated     11/03/2020, I will continue to walk daily for 15 minutes.      Patient Stated     Gain some weight.        This is a list of the screening recommended for you and due dates:  Health Maintenance  Topic Date Due   DTaP/Tdap/Td vaccine (1 - Tdap) Never done   Zoster (Shingles) Vaccine (1 of 2) Never done   COVID-19 Vaccine (3 - Pfizer risk series) 01/24/2021   Medicare Annual Wellness Visit  11/07/2022   Flu Shot  11/28/2022   Pneumonia Vaccine  Completed   DEXA scan (bone density measurement)  Completed   HPV Vaccine  Aged Out    Advanced directives: Please bring a copy of your health care power of attorney and living will to the office to be added to your chart at your convenience.   Conditions/risks identified: Aim for 30 minutes of exercise or brisk walking, 6-8 glasses of water, and 5 servings of fruits and vegetables each day.   Next appointment: Follow up in one year for your annual wellness visit 11/12/23 @ 8:45 telephone visit   Preventive Care 65 Years and Older, Female Preventive care refers to lifestyle choices and visits with your health care provider that can promote health and wellness. What does preventive care include? A yearly physical exam. This is also called an annual well check. Dental exams once or twice  a year. Routine eye exams. Ask your health care provider how often you should have your eyes checked. Personal lifestyle choices, including: Daily care of your teeth and gums. Regular physical activity. Eating a healthy diet. Avoiding tobacco and drug use. Limiting alcohol use. Practicing safe sex. Taking low-dose aspirin every day. Taking vitamin and mineral supplements as recommended by your health care provider. What happens during an annual well check? The services and screenings done by your health care provider during your annual well check will depend on your age, overall health, lifestyle risk factors, and family history of disease. Counseling  Your health care provider may ask you questions about your: Alcohol use. Tobacco use. Drug use. Emotional well-being. Home and relationship well-being. Sexual activity. Eating habits. History of falls. Memory and ability to understand (cognition). Work and work Astronomer. Reproductive health. Screening  You may have the following tests or measurements: Height, weight, and BMI. Blood pressure. Lipid and cholesterol levels. These may be checked every 5 years, or more frequently if you are over 68 years old. Skin check. Lung cancer screening. You may have this screening every year starting at age 108 if you have a 30-pack-year history of smoking and currently  smoke or have quit within the past 15 years. Fecal occult blood test (FOBT) of the stool. You may have this test every year starting at age 67. Flexible sigmoidoscopy or colonoscopy. You may have a sigmoidoscopy every 5 years or a colonoscopy every 10 years starting at age 73. Hepatitis C blood test. Hepatitis B blood test. Sexually transmitted disease (STD) testing. Diabetes screening. This is done by checking your blood sugar (glucose) after you have not eaten for a while (fasting). You may have this done every 1-3 years. Bone density scan. This is done to screen for  osteoporosis. You may have this done starting at age 50. Mammogram. This may be done every 1-2 years. Talk to your health care provider about how often you should have regular mammograms. Talk with your health care provider about your test results, treatment options, and if necessary, the need for more tests. Vaccines  Your health care provider may recommend certain vaccines, such as: Influenza vaccine. This is recommended every year. Tetanus, diphtheria, and acellular pertussis (Tdap, Td) vaccine. You may need a Td booster every 10 years. Zoster vaccine. You may need this after age 40. Pneumococcal 13-valent conjugate (PCV13) vaccine. One dose is recommended after age 71. Pneumococcal polysaccharide (PPSV23) vaccine. One dose is recommended after age 31. Talk to your health care provider about which screenings and vaccines you need and how often you need them. This information is not intended to replace advice given to you by your health care provider. Make sure you discuss any questions you have with your health care provider. Document Released: 05/12/2015 Document Revised: 01/03/2016 Document Reviewed: 02/14/2015 Elsevier Interactive Patient Education  2017 [employer].  Fall Prevention in the Home Falls can cause injuries. They can happen to people of all ages. There are many things you can do to make your home safe and to help prevent falls. What can I do on the outside of my home? Regularly fix the edges of walkways and driveways and fix any cracks. Remove anything that might make you trip as you walk through a door, such as a raised step or threshold. Trim any bushes or trees on the path to your home. Use bright outdoor lighting. Clear any walking paths of anything that might make someone trip, such as rocks or tools. Regularly check to see if handrails are loose or broken. Make sure that both sides of any steps have handrails. Any raised decks and porches should have guardrails on  the edges. Have any leaves, snow, or ice cleared regularly. Use sand or salt on walking paths during winter. Clean up any spills in your garage right away. This includes oil or grease spills. What can I do in the bathroom? Use night lights. Install grab bars by the toilet and in the tub and shower. Do not use towel bars as grab bars. Use non-skid mats or decals in the tub or shower. If you need to sit down in the shower, use a plastic, non-slip stool. Keep the floor dry. Clean up any water that spills on the floor as soon as it happens. Remove soap buildup in the tub or shower regularly. Attach bath mats securely with double-sided non-slip rug tape. Do not have throw rugs and other things on the floor that can make you trip. What can I do in the bedroom? Use night lights. Make sure that you have a light by your bed that is easy to reach. Do not use any sheets or blankets that are too big  for your bed. They should not hang down onto the floor. Have a firm chair that has side arms. You can use this for support while you get dressed. Do not have throw rugs and other things on the floor that can make you trip. What can I do in the kitchen? Clean up any spills right away. Avoid walking on wet floors. Keep items that you use a lot in easy-to-reach places. If you need to reach something above you, use a strong step stool that has a grab bar. Keep electrical cords out of the way. Do not use floor polish or wax that makes floors slippery. If you must use wax, use non-skid floor wax. Do not have throw rugs and other things on the floor that can make you trip. What can I do with my stairs? Do not leave any items on the stairs. Make sure that there are handrails on both sides of the stairs and use them. Fix handrails that are broken or loose. Make sure that handrails are as long as the stairways. Check any carpeting to make sure that it is firmly attached to the stairs. Fix any carpet that is loose  or worn. Avoid having throw rugs at the top or bottom of the stairs. If you do have throw rugs, attach them to the floor with carpet tape. Make sure that you have a light switch at the top of the stairs and the bottom of the stairs. If you do not have them, ask someone to add them for you. What else can I do to help prevent falls? Wear shoes that: Do not have high heels. Have rubber bottoms. Are comfortable and fit you well. Are closed at the toe. Do not wear sandals. If you use a stepladder: Make sure that it is fully opened. Do not climb a closed stepladder. Make sure that both sides of the stepladder are locked into place. Ask someone to hold it for you, if possible. Clearly mark and make sure that you can see: Any grab bars or handrails. First and last steps. Where the edge of each step is. Use tools that help you move around (mobility aids) if they are needed. These include: Canes. Walkers. Scooters. Crutches. Turn on the lights when you go into a dark area. Replace any light bulbs as soon as they burn out. Set up your furniture so you have a clear path. Avoid moving your furniture around. If any of your floors are uneven, fix them. If there are any pets around you, be aware of where they are. Review your medicines with your doctor. Some medicines can make you feel dizzy. This can increase your chance of falling. Ask your doctor what other things that you can do to help prevent falls. This information is not intended to replace advice given to you by your health care provider. Make sure you discuss any questions you have with your health care provider. Document Released: 02/09/2009 Document Revised: 09/21/2015 Document Reviewed: 05/20/2014 Elsevier Interactive Patient Education  2017 [employer].

## 2022-11-11 NOTE — Progress Notes (Signed)
Subjective:   Anne Ferrell is a 89 y.o. female who presents for Medicare Annual (Subsequent) preventive examination.  Visit Complete: Virtual  I connected with  Anne Ferrell on 11/11/22 by a audio enabled telemedicine application and verified that I am speaking with the correct person using two identifiers.  Patient Location: Home  Provider Location: Office/Clinic  I discussed the limitations of evaluation and management by telemedicine. The patient expressed understanding and agreed to proceed.   Review of Systems      Cardiac Risk Factors include: advanced age (>8men, >10 women);hypertension     Objective:    Today's Vitals   11/11/22 0841  BP: 125/75  Pulse: 89  Weight: 125 lb (56.7 kg)  Height: 5' 9.5" (1.765 m)   Body mass index is 18.19 kg/m.     11/11/2022    8:52 AM 02/27/2022   10:14 AM 11/06/2021    9:10 AM 12/22/2020    9:44 AM 11/03/2020    9:09 AM 11/03/2019    9:05 AM 10/10/2017    8:59 AM  Advanced Directives  Does Patient Have a Medical Advance Directive? Yes Yes No No;Yes Yes Yes Yes  Type of Estate agent of [place];Living will Healthcare Power of [place];Living will  Healthcare Power of [employer] of [place];Living will Healthcare Power of [place];Living will Healthcare Power of [place];Living will  Does patient want to make changes to medical advance directive?  No - Patient declined  No - Patient declined     Copy of Healthcare Power of Attorney in Chart? No - copy requested Yes - validated most recent copy scanned in chart (See row information)  No - copy requested No - copy requested No - copy requested No - copy requested  Would patient like information on creating a medical advance directive?   No - Patient declined No - Patient declined       Current Medications (verified) Outpatient Encounter Medications as of 11/11/2022  Medication Sig   diltiazem (CARDIZEM) 120 MG tablet Take 1 tablet by mouth  daily.   Magnesium 250 MG TABS Take 2 tablets by mouth daily.   Multiple Vitamins-Minerals (PRESERVISION AREDS) CAPS Take 1 capsule by mouth daily.   VITAMIN A PO Take 2 tablets by mouth daily. 10,000 units   vitamin B-12 (CYANOCOBALAMIN) 1000 MCG tablet Take 1,000 mcg by mouth daily.   Vitamin D, Cholecalciferol, 25 MCG (1000 UT) TABS Take 1 tablet by mouth daily.   atenolol (TENORMIN) 25 MG tablet Take 1 tablet by mouth once daily (Patient not taking: Reported on 11/11/2022)   calcium carbonate (OS-CAL - DOSED IN MG OF ELEMENTAL CALCIUM) 1250 (500 Ca) MG tablet Take 1 tablet (500 mg of elemental calcium total) by mouth daily with breakfast. (Patient not taking: Reported on 11/11/2022)   No facility-administered encounter medications on file as of 11/11/2022.    Allergies (verified) Codeine, Percocet [oxycodone-acetaminophen], Timolol, Anise flavor, Ciprofloxacin, Levofloxacin, and Licorice flavor [flavoring agent]   History: Past Medical History:  Diagnosis Date   Diverticulitis    Glaucoma    High blood pressure    Osteoarthritis    knees   Osteoporosis 10/05/2015   Vitamin D deficiency 09/18/2015   Past Surgical History:  Procedure Laterality Date   ABDOMINAL HYSTERECTOMY  2008   CATARACT EXTRACTION W/PHACO Right 02/27/2022   Procedure: Milon Dikes DUAL BLADE GONIOTOMY RIGHT;  Surgeon: Lockie Mola, MD;  [facility];  Service: Ophthalmology;  Laterality: Right;   cataract surgery Bilateral 2011  CLOSED REDUCTION WRIST FRACTURE Right 12/23/2020   Procedure: CLOSED REDUCTION AND PERCUTANEOUS PINNING RIGHT WRIST;  Surgeon: Deeann Saint, MD;  Location: [facility];  Service: Orthopedics;  Laterality: Right;   HIP ARTHROPLASTY Right 12/23/2020   Procedure: ARTHROPLASTY BIPOLAR HIP (HEMIARTHROPLASTY);  Surgeon: Deeann Saint, MD;  Location: [facility];  Service: Orthopedics;  Laterality: Right;   ORIF ELBOW FRACTURE Right 12/23/2020   Procedure: OPEN REDUCTION INTERNAL  FIXATION (ORIF) ELBOW/OLECRANON FRACTURE;  Surgeon: Deeann Saint, MD;  Location: [facility];  Service: Orthopedics;  Laterality: Right;   TONSILLECTOMY AND ADENOIDECTOMY  1941   Family History  Problem Relation Age of Onset   Cancer Father        prostate   Colon cancer Neg Hx    Social History   Socioeconomic History   Marital status: Widowed    Spouse name: Not on file   Number of children: Not on file   Years of education: Not on file   Highest education level: Not on file  Occupational History   Not on file  Tobacco Use   Smoking status: Never   Smokeless tobacco: Never  Vaping Use   Vaping status: Never Used  Substance and Sexual Activity   Alcohol use: Yes    Alcohol/week: 7.0 standard drinks of alcohol    Types: 7 Glasses of wine per week   Drug use: No   Sexual activity: Not Currently  Other Topics Concern   Not on file  Social History Narrative   Mother of Anne Ferrell   Social Determinants of Health   Financial Resource Strain: Low Risk  (11/11/2022)   Overall Financial Resource Strain (CARDIA)    Difficulty of Paying Living Expenses: Not hard at all  Food Insecurity: No Food Insecurity (11/11/2022)   Hunger Vital Sign    Worried About Running Out of Food in the Last Year: Never true    Ran Out of Food in the Last Year: Never true  Transportation Needs: No Transportation Needs (11/11/2022)   PRAPARE - Administrator, Civil Service (Medical): No    Lack of Transportation (Non-Medical): No  Physical Activity: Insufficiently Active (11/11/2022)   Exercise Vital Sign    Days of Exercise per Week: 7 days    Minutes of Exercise per Session: 20 min  Stress: No Stress Concern Present (11/11/2022)   Anne Ferrell of Occupational Health - Occupational Stress Questionnaire    Feeling of Stress : Not at all  Social Connections: Socially Isolated (11/11/2022)   Social Connection and Isolation Panel [NHANES]    Frequency of Communication with Friends and  Family: Three times a week    Frequency of Social Gatherings with Friends and Family: Three times a week    Attends Religious Services: Never    Active Member of Clubs or Organizations: No    Attends Banker Meetings: Never    Marital Status: Widowed    Tobacco Counseling Counseling given: Not Answered   Clinical Intake:  Pre-visit preparation completed: Yes  Pain : No/denies pain     BMI - recorded: 18.19 Nutritional Risks: None Diabetes: No  How often do you need to have someone help you when you read instructions, pamphlets, or other written materials from your doctor or pharmacy?: 1 - Never  Interpreter Needed?: No  Information entered by :: C.Isabellarose Kope LPN   Activities of Daily Living    11/11/2022    8:53 AM 02/27/2022   10:10 AM  In your present state of  health, do you have any difficulty performing the following activities:  Hearing? 0 0  Vision? 0 0  Difficulty concentrating or making decisions? 0 0  Walking or climbing stairs? 0 0  Dressing or bathing? 0 0  Doing errands, shopping? 0   Preparing Food and eating ? N   Using the Toilet? N   In the past six months, have you accidently leaked urine? N   Do you have problems with loss of bowel control? N   Managing your Medications? N   Managing your Finances? N   Housekeeping or managing your Housekeeping? N     Patient Care Team: Hannah Beat, MD as PCP - General (Family Medicine) Lockie Mola, MD as Referring Physician (Ophthalmology) Laurier Nancy, MD as Consulting Physician (Cardiology) Delton See, MD (Inactive) as Consulting Physician (Rehabilitation)  Indicate any recent Medical Services you may have received from other than [facility] providers in the past year (date may be approximate).     Assessment:   This is a routine wellness examination for Neera.  Hearing/Vision screen Hearing Screening - Comments:: Slight hearing difficulty on the phone or in crowds. No  aids. Vision Screening - Comments:: Glasses - [facility] - UTD on eye exams  Dietary issues and exercise activities discussed:     Goals Addressed             This Visit's Progress    Patient Stated       Gain some weight.       Depression Screen    11/11/2022    8:51 AM 11/06/2021    9:08 AM 11/03/2020    9:10 AM 11/03/2019    9:06 AM 11/02/2018    9:23 AM 10/10/2017    8:50 AM 10/03/2016   10:16 AM  PHQ 2/9 Scores  PHQ - 2 Score 0 0 0 0 0 0 0  PHQ- 9 Score  0 0 0  0     Fall Risk    11/11/2022    8:53 AM 11/06/2021    9:11 AM 11/03/2020    9:10 AM 11/03/2019    9:06 AM 11/27/2018   11:57 AM  Fall Risk   Falls in the past year? 0 1 0 0 0  Comment     Emmi Telephone Survey: data to providers prior to load  Number falls in past yr: 0 0 0 0   Injury with Fall? 0 1 0 0   Risk for fall due to : No Fall Risks History of fall(s) Medication side effect Medication side effect   Follow up Falls prevention discussed;Falls evaluation completed Falls prevention discussed;Falls evaluation completed Falls evaluation completed;Falls prevention discussed Falls evaluation completed;Falls prevention discussed     MEDICARE RISK AT HOME:  Medicare Risk at Home - 11/11/22 0854     Any stairs in or around the home? Yes    If so, are there any without handrails? No    Home free of loose throw rugs in walkways, pet beds, electrical cords, etc? Yes    Adequate lighting in your home to reduce risk of falls? Yes    Life alert? No    Use of a cane, walker or w/c? No    Grab bars in the bathroom? Yes    Shower chair or bench in shower? Yes    Elevated toilet seat or a handicapped toilet? Yes             TIMED UP AND GO:  Was the test  performed?  No    Cognitive Function:    11/03/2020    9:12 AM 11/03/2019    9:08 AM 10/10/2017    8:52 AM 10/03/2016   10:16 AM 09/12/2015   11:00 AM  MMSE - Mini Mental State Exam  Orientation to time 5 5 5 5 5   Orientation to Place 5 5 5 5 5    Registration 3 3 3 3 3   Attention/ Calculation 5 5 0 0 0  Recall 3 3 3 3 3   Language- name 2 objects   0 0 0  Language- repeat 1 1 1 1 1   Language- follow 3 step command   3 3 3   Language- read & follow direction   0 0 0  Write a sentence   0 0 0  Copy design   0 0 0  Total score   20 20 20         11/11/2022    8:56 AM  6CIT Screen  What Year? 0 points  What month? 0 points  What time? 0 points  Count back from 20 0 points  Months in reverse 0 points  Repeat phrase 0 points  Total Score 0 points    Immunizations Immunization History  Administered Date(s) Administered   Fluad Quad(high Dose 65+) 02/16/2019   Influenza,inj,Quad PF,6+ Mos 04/12/2013, 03/10/2014, 03/29/2015, 03/05/2016, 03/06/2017, 02/26/2018   Moderna SARS-COV2 Booster Vaccination 04/30/2020, 12/27/2020   PFIZER(Purple Top)SARS-COV-2 Vaccination 05/22/2019, 06/12/2019   Pneumococcal Conjugate-13 09/12/2015   Pneumococcal Polysaccharide-23 04/12/2013    TDAP status: Due, Education has been provided regarding the importance of this vaccine. Advised may receive this vaccine at local pharmacy or Health Dept. Aware to provide a copy of the vaccination record if obtained from local pharmacy or Health Dept. Verbalized acceptance and understanding.  Flu Vaccine status: Up to date  Pneumococcal vaccine status: Up to date  Covid-19 vaccine status: Information provided on how to obtain vaccines.   Qualifies for Shingles Vaccine? Yes   Zostavax completed No   Shingrix Completed?: No.    Education has been provided regarding the importance of this vaccine. Patient has been advised to call insurance company to determine out of pocket expense if they have not yet received this vaccine. Advised may also receive vaccine at local pharmacy or Health Dept. Verbalized acceptance and understanding.  Screening Tests Health Maintenance  Topic Date Due   DTaP/Tdap/Td (1 - Tdap) Never done   Zoster Vaccines- Shingrix (1 of 2)  Never done   COVID-19 Vaccine (3 - Pfizer risk series) 01/24/2021   Medicare Annual Wellness (AWV)  11/07/2022   INFLUENZA VACCINE  11/28/2022   Pneumonia Vaccine 33+ Years old  Completed   DEXA SCAN  Completed   HPV VACCINES  Aged Out    Health Maintenance  Health Maintenance Due  Topic Date Due   DTaP/Tdap/Td (1 - Tdap) Never done   Zoster Vaccines- Shingrix (1 of 2) Never done   COVID-19 Vaccine (3 - Pfizer risk series) 01/24/2021   Medicare Annual Wellness (AWV)  11/07/2022    Colorectal cancer screening: No longer required.   Mammogram status: No longer required due to age.  Bone Scan- Pt declined  Lung Cancer Screening: (Low Dose CT Chest recommended if Age 20-80 years, 20 pack-year currently smoking OR have quit w/in 15years.) does not qualify.   Lung Cancer Screening Referral: n/a  Additional Screening:  Hepatitis C Screening: does not qualify; Completed n/a  Vision Screening: Recommended annual ophthalmology exams for early detection  of glaucoma and other disorders of the eye. Is the patient up to date with their annual eye exam?  Yes  Who is the provider or what is the name of the office in which the patient attends annual eye exams? [facility] If pt is not established with a provider, would they like to be referred to a provider to establish care? Yes .   Dental Screening: Recommended annual dental exams for proper oral hygiene    Community Resource Referral / Chronic Care Management: CRR required this visit?  No   CCM required this visit?  No     Plan:     I have personally reviewed and noted the following in the patient's chart:   Medical and social history Use of alcohol, tobacco or illicit drugs  Current medications and supplements including opioid prescriptions. Patient is not currently taking opioid prescriptions. Functional ability and status Nutritional status Physical activity Advanced directives List of other  physicians Hospitalizations, surgeries, and ER visits in previous 12 months Vitals Screenings to include cognitive, depression, and falls Referrals and appointments  In addition, I have reviewed and discussed with patient certain preventive protocols, quality metrics, and best practice recommendations. A written personalized care plan for preventive services as well as general preventive health recommendations were provided to patient.     Maryan Puls, LPN   10/01/5407   After Visit Summary: (MyChart) Due to this being a telephonic visit, the after visit summary with patients personalized plan was offered to patient via MyChart   Nurse Notes: none

## 2022-12-11 ENCOUNTER — Other Ambulatory Visit: Payer: Self-pay | Admitting: Family Medicine

## 2022-12-11 ENCOUNTER — Telehealth: Payer: Self-pay | Admitting: Family Medicine

## 2022-12-11 DIAGNOSIS — E038 Other specified hypothyroidism: Secondary | ICD-10-CM

## 2022-12-11 DIAGNOSIS — Z79899 Other long term (current) drug therapy: Secondary | ICD-10-CM

## 2022-12-11 DIAGNOSIS — E785 Hyperlipidemia, unspecified: Secondary | ICD-10-CM

## 2022-12-11 DIAGNOSIS — E559 Vitamin D deficiency, unspecified: Secondary | ICD-10-CM

## 2022-12-11 DIAGNOSIS — R7303 Prediabetes: Secondary | ICD-10-CM

## 2022-12-11 NOTE — Telephone Encounter (Signed)
Yes, I ordered all of her labs including thyroid studies.

## 2022-12-11 NOTE — Telephone Encounter (Signed)
Patient contacted the office and would like to know if Dr copland can include orders for her to have her thyroid checked at lab appointment on 8/21.

## 2022-12-11 NOTE — Telephone Encounter (Signed)
Medeloff notified by telephone that labs orders have been placed as requested.

## 2022-12-18 ENCOUNTER — Other Ambulatory Visit (INDEPENDENT_AMBULATORY_CARE_PROVIDER_SITE_OTHER): Payer: Medicare Other

## 2022-12-18 DIAGNOSIS — E785 Hyperlipidemia, unspecified: Secondary | ICD-10-CM | POA: Diagnosis not present

## 2022-12-18 DIAGNOSIS — Z79899 Other long term (current) drug therapy: Secondary | ICD-10-CM

## 2022-12-18 DIAGNOSIS — R7303 Prediabetes: Secondary | ICD-10-CM

## 2022-12-18 DIAGNOSIS — E559 Vitamin D deficiency, unspecified: Secondary | ICD-10-CM

## 2022-12-18 DIAGNOSIS — E038 Other specified hypothyroidism: Secondary | ICD-10-CM | POA: Diagnosis not present

## 2022-12-18 LAB — HEPATIC FUNCTION PANEL
ALT: 21 U/L (ref 0–35)
AST: 26 U/L (ref 0–37)
Albumin: 4.3 g/dL (ref 3.5–5.2)
Alkaline Phosphatase: 79 U/L (ref 39–117)
Bilirubin, Direct: 0.1 mg/dL (ref 0.0–0.3)
Total Bilirubin: 0.7 mg/dL (ref 0.2–1.2)
Total Protein: 6.3 g/dL (ref 6.0–8.3)

## 2022-12-18 LAB — BASIC METABOLIC PANEL
BUN: 19 mg/dL (ref 6–23)
CO2: 27 meq/L (ref 19–32)
Calcium: 9.1 mg/dL (ref 8.4–10.5)
Chloride: 106 meq/L (ref 96–112)
Creatinine, Ser: 0.75 mg/dL (ref 0.40–1.20)
GFR: 71.06 mL/min (ref >60.00)
Glucose, Bld: 99 mg/dL (ref 70–99)
Potassium: 4.4 meq/L (ref 3.5–5.1)
Sodium: 140 meq/L (ref 135–145)

## 2022-12-18 LAB — LIPID PANEL
Cholesterol: 228 mg/dL — ABNORMAL HIGH (ref 0–200)
HDL: 78.8 mg/dL (ref >39.00)
LDL Cholesterol: 135 mg/dL — ABNORMAL HIGH (ref 0–99)
NonHDL: 148.92
Total CHOL/HDL Ratio: 3
Triglycerides: 69 mg/dL (ref 0.0–149.0)
VLDL: 13.8 mg/dL (ref 0.0–40.0)

## 2022-12-18 LAB — CBC WITH DIFFERENTIAL/PLATELET
Basophils Absolute: 0.1 10*3/uL (ref 0.0–0.1)
Basophils Relative: 1.3 % (ref 0.0–3.0)
Eosinophils Absolute: 0 10*3/uL (ref 0.0–0.7)
Eosinophils Relative: 0.7 % (ref 0.0–5.0)
HCT: 42.2 % (ref 36.0–46.0)
Hemoglobin: 13.6 g/dL (ref 12.0–15.0)
Lymphocytes Relative: 13.4 % (ref 12.0–46.0)
Lymphs Abs: 0.7 10*3/uL (ref 0.7–4.0)
MCHC: 32.3 g/dL (ref 30.0–36.0)
MCV: 94.3 fl (ref 78.0–100.0)
Monocytes Absolute: 0.5 10*3/uL (ref 0.1–1.0)
Monocytes Relative: 9.8 % (ref 3.0–12.0)
Neutro Abs: 3.7 10*3/uL (ref 1.4–7.7)
Neutrophils Relative %: 74.8 % (ref 43.0–77.0)
Platelets: 258 10*3/uL (ref 150.0–400.0)
RBC: 4.47 Mil/uL (ref 3.87–5.11)
RDW: 13.6 % (ref 11.5–15.5)
WBC: 4.9 10*3/uL (ref 4.0–10.5)

## 2022-12-18 LAB — HEMOGLOBIN A1C: Hgb A1c MFr Bld: 5.7 % (ref 4.6–6.5)

## 2022-12-20 LAB — T4, FREE: Free T4: 0.94 ng/dL (ref 0.60–1.60)

## 2022-12-20 LAB — TSH: TSH: 0.97 u[IU]/mL (ref 0.35–5.50)

## 2022-12-20 LAB — VITAMIN D 25 HYDROXY (VIT D DEFICIENCY, FRACTURES): VITD: 36.02 ng/mL (ref 30.00–100.00)

## 2022-12-20 LAB — T3, FREE: T3, Free: 3.3 pg/mL (ref 2.3–4.2)

## 2022-12-24 NOTE — Progress Notes (Unsigned)
    Anne Shiffer T. Ewell Benassi, MD, CAQ Sports Medicine [facility] [address] [place], [address]  Phone: [phone]  FAX: [phone]  Anne Ferrell - 89 y.o. female  MRN [id number]  Date of Birth: 1933-04-28  Date: 12/25/2022  PCP: Hannah Beat, MD  Referral: Hannah Beat, MD  No chief complaint on file.  Subjective:   Anne Ferrell is a 89 y.o. very pleasant female patient with There is no height or weight on file to calculate BMI. who presents with the following:  The patient is here for chronic medical management follow-up after prior Medicare wellness exam done by LPN in July.  Shingrix RSV Covid booster Tdap  HTN: Tolerating all medications without side effects Stable and at goal No CP, no sob. No HA.  BP Readings from Last 3 Encounters:  11/11/22 125/75  02/27/22 (!) 140/77  11/22/21 130/80    Basic Metabolic Panel:    Component Value Date/Time   NA 140 12/18/2022 0900   K 4.4 12/18/2022 0900   CL 106 12/18/2022 0900   CO2 27 12/18/2022 0900   BUN 19 12/18/2022 0900   CREATININE 0.75 12/18/2022 0900   GLUCOSE 99 12/18/2022 0900   CALCIUM 9.1 12/18/2022 0900    Health Maintenance  Topic Date Due   DTaP/Tdap/Td (1 - Tdap) Never done   Zoster Vaccines- Shingrix (1 of 2) Never done   COVID-19 Vaccine (3 - Pfizer risk series) 01/24/2021   INFLUENZA VACCINE  11/28/2022   Medicare Annual Wellness (AWV)  11/11/2023   Pneumonia Vaccine 62+ Years old  Completed   DEXA SCAN  Completed   HPV VACCINES  Aged Out    Immunization History  Administered Date(s) Administered   Fluad Quad(high Dose 65+) 02/16/2019   Influenza,inj,Quad PF,6+ Mos 04/12/2013, 03/10/2014, 03/29/2015, 03/05/2016, 03/06/2017, 02/26/2018   Moderna SARS-COV2 Booster Vaccination 04/30/2020, 12/27/2020   PFIZER(Purple Top)SARS-COV-2 Vaccination 05/22/2019, 06/12/2019   Pneumococcal Conjugate-13 09/12/2015   Pneumococcal Polysaccharide-23  04/12/2013     Review of Systems is noted in the HPI, as appropriate  Objective:   There were no vitals taken for this visit.  GEN: No acute distress; alert,appropriate. PULM: Breathing comfortably in no respiratory distress PSYCH: Normally interactive.   Laboratory and Imaging Data:  Assessment and Plan:   ***

## 2022-12-25 ENCOUNTER — Encounter: Payer: Self-pay | Admitting: Family Medicine

## 2022-12-25 ENCOUNTER — Ambulatory Visit (INDEPENDENT_AMBULATORY_CARE_PROVIDER_SITE_OTHER): Payer: Medicare Other | Admitting: Family Medicine

## 2022-12-25 VITALS — BP 138/80 | HR 93 | Temp 98.2°F | Ht 69.25 in | Wt 125.2 lb

## 2022-12-25 DIAGNOSIS — E782 Mixed hyperlipidemia: Secondary | ICD-10-CM | POA: Diagnosis not present

## 2022-12-25 DIAGNOSIS — I10 Essential (primary) hypertension: Secondary | ICD-10-CM

## 2022-12-25 NOTE — Patient Instructions (Addendum)
There are a few vaccines that you should think about:  Annual flu shot  Annual Covid booster  Shingles vaccine:  The newer [facility] shot is much better than the older shot. The [facility] shot requires 2 shots given 6 months apart.

## 2022-12-26 DIAGNOSIS — E782 Mixed hyperlipidemia: Secondary | ICD-10-CM | POA: Insufficient documentation

## 2023-01-18 DIAGNOSIS — Z23 Encounter for immunization: Secondary | ICD-10-CM | POA: Diagnosis not present

## 2023-01-30 DIAGNOSIS — E7800 Pure hypercholesterolemia, unspecified: Secondary | ICD-10-CM | POA: Diagnosis not present

## 2023-01-30 DIAGNOSIS — R42 Dizziness and giddiness: Secondary | ICD-10-CM | POA: Diagnosis not present

## 2023-01-30 DIAGNOSIS — R011 Cardiac murmur, unspecified: Secondary | ICD-10-CM | POA: Diagnosis not present

## 2023-01-30 DIAGNOSIS — I10 Essential (primary) hypertension: Secondary | ICD-10-CM | POA: Diagnosis not present

## 2023-01-30 DIAGNOSIS — I38 Endocarditis, valve unspecified: Secondary | ICD-10-CM | POA: Diagnosis not present

## 2023-01-30 DIAGNOSIS — Q2388 Other congenital malformations of aortic and mitral valves: Secondary | ICD-10-CM | POA: Diagnosis not present

## 2023-01-30 DIAGNOSIS — R55 Syncope and collapse: Secondary | ICD-10-CM | POA: Diagnosis not present

## 2023-01-30 DIAGNOSIS — I358 Other nonrheumatic aortic valve disorders: Secondary | ICD-10-CM | POA: Diagnosis not present

## 2023-01-30 DIAGNOSIS — G453 Amaurosis fugax: Secondary | ICD-10-CM | POA: Diagnosis not present

## 2023-01-30 DIAGNOSIS — R5383 Other fatigue: Secondary | ICD-10-CM | POA: Diagnosis not present

## 2023-01-30 DIAGNOSIS — R000 Tachycardia, unspecified: Secondary | ICD-10-CM | POA: Diagnosis not present

## 2023-02-06 DIAGNOSIS — H401121 Primary open-angle glaucoma, left eye, mild stage: Secondary | ICD-10-CM | POA: Diagnosis not present

## 2023-02-06 DIAGNOSIS — H401112 Primary open-angle glaucoma, right eye, moderate stage: Secondary | ICD-10-CM | POA: Diagnosis not present

## 2023-02-06 DIAGNOSIS — Z961 Presence of intraocular lens: Secondary | ICD-10-CM | POA: Diagnosis not present

## 2023-02-16 DIAGNOSIS — Z23 Encounter for immunization: Secondary | ICD-10-CM | POA: Diagnosis not present

## 2023-04-08 DIAGNOSIS — Q2388 Other congenital malformations of aortic and mitral valves: Secondary | ICD-10-CM | POA: Diagnosis not present

## 2023-04-08 DIAGNOSIS — I10 Essential (primary) hypertension: Secondary | ICD-10-CM | POA: Diagnosis not present

## 2023-04-08 DIAGNOSIS — R42 Dizziness and giddiness: Secondary | ICD-10-CM | POA: Diagnosis not present

## 2023-04-08 DIAGNOSIS — G453 Amaurosis fugax: Secondary | ICD-10-CM | POA: Diagnosis not present

## 2023-04-08 DIAGNOSIS — I38 Endocarditis, valve unspecified: Secondary | ICD-10-CM | POA: Diagnosis not present

## 2023-04-08 DIAGNOSIS — R5383 Other fatigue: Secondary | ICD-10-CM | POA: Diagnosis not present

## 2023-04-08 DIAGNOSIS — I358 Other nonrheumatic aortic valve disorders: Secondary | ICD-10-CM | POA: Diagnosis not present

## 2023-04-08 DIAGNOSIS — R000 Tachycardia, unspecified: Secondary | ICD-10-CM | POA: Diagnosis not present

## 2023-04-08 DIAGNOSIS — R011 Cardiac murmur, unspecified: Secondary | ICD-10-CM | POA: Diagnosis not present

## 2023-04-08 DIAGNOSIS — R55 Syncope and collapse: Secondary | ICD-10-CM | POA: Diagnosis not present

## 2023-09-02 ENCOUNTER — Other Ambulatory Visit: Payer: Self-pay

## 2023-09-02 ENCOUNTER — Inpatient Hospital Stay
Admission: EM | Admit: 2023-09-02 | Discharge: 2023-09-05 | DRG: 981 | Disposition: A | Discharge status: Home Health | Attending: Hospitalist | Admitting: Hospitalist

## 2023-09-02 DIAGNOSIS — R58 Hemorrhage, not elsewhere classified: Secondary | ICD-10-CM

## 2023-09-02 DIAGNOSIS — I872 Venous insufficiency (chronic) (peripheral): Secondary | ICD-10-CM | POA: Diagnosis present

## 2023-09-02 DIAGNOSIS — E559 Vitamin D deficiency, unspecified: Secondary | ICD-10-CM | POA: Diagnosis present

## 2023-09-02 DIAGNOSIS — I340 Nonrheumatic mitral (valve) insufficiency: Secondary | ICD-10-CM | POA: Diagnosis present

## 2023-09-02 DIAGNOSIS — I110 Hypertensive heart disease with heart failure: Secondary | ICD-10-CM | POA: Diagnosis present

## 2023-09-02 DIAGNOSIS — R578 Other shock: Principal | ICD-10-CM | POA: Diagnosis present

## 2023-09-02 DIAGNOSIS — I5022 Chronic systolic (congestive) heart failure: Secondary | ICD-10-CM | POA: Diagnosis present

## 2023-09-02 DIAGNOSIS — I5181 Takotsubo syndrome: Secondary | ICD-10-CM | POA: Diagnosis present

## 2023-09-02 DIAGNOSIS — D62 Acute posthemorrhagic anemia: Principal | ICD-10-CM | POA: Diagnosis present

## 2023-09-02 DIAGNOSIS — E869 Volume depletion, unspecified: Secondary | ICD-10-CM | POA: Diagnosis present

## 2023-09-02 DIAGNOSIS — H409 Unspecified glaucoma: Secondary | ICD-10-CM | POA: Diagnosis present

## 2023-09-02 DIAGNOSIS — I358 Other nonrheumatic aortic valve disorders: Secondary | ICD-10-CM | POA: Diagnosis present

## 2023-09-02 DIAGNOSIS — Z79899 Other long term (current) drug therapy: Secondary | ICD-10-CM

## 2023-09-02 DIAGNOSIS — I83013 Varicose veins of right lower extremity with ulcer of ankle: Secondary | ICD-10-CM | POA: Diagnosis not present

## 2023-09-02 DIAGNOSIS — R9431 Abnormal electrocardiogram [ECG] [EKG]: Secondary | ICD-10-CM

## 2023-09-02 DIAGNOSIS — Z7982 Long term (current) use of aspirin: Secondary | ICD-10-CM

## 2023-09-02 DIAGNOSIS — Z96641 Presence of right artificial hip joint: Secondary | ICD-10-CM | POA: Diagnosis present

## 2023-09-02 DIAGNOSIS — I10 Essential (primary) hypertension: Secondary | ICD-10-CM | POA: Diagnosis present

## 2023-09-02 DIAGNOSIS — I341 Nonrheumatic mitral (valve) prolapse: Secondary | ICD-10-CM | POA: Diagnosis present

## 2023-09-02 DIAGNOSIS — M170 Bilateral primary osteoarthritis of knee: Secondary | ICD-10-CM | POA: Diagnosis present

## 2023-09-02 DIAGNOSIS — L97311 Non-pressure chronic ulcer of right ankle limited to breakdown of skin: Secondary | ICD-10-CM

## 2023-09-02 DIAGNOSIS — I2489 Other forms of acute ischemic heart disease: Secondary | ICD-10-CM | POA: Diagnosis not present

## 2023-09-02 DIAGNOSIS — M810 Age-related osteoporosis without current pathological fracture: Secondary | ICD-10-CM | POA: Diagnosis present

## 2023-09-02 DIAGNOSIS — Z7902 Long term (current) use of antithrombotics/antiplatelets: Secondary | ICD-10-CM

## 2023-09-02 DIAGNOSIS — D500 Iron deficiency anemia secondary to blood loss (chronic): Secondary | ICD-10-CM | POA: Diagnosis present

## 2023-09-02 DIAGNOSIS — Z91018 Allergy to other foods: Secondary | ICD-10-CM

## 2023-09-02 DIAGNOSIS — L97919 Non-pressure chronic ulcer of unspecified part of right lower leg with unspecified severity: Secondary | ICD-10-CM | POA: Diagnosis present

## 2023-09-02 DIAGNOSIS — Z881 Allergy status to other antibiotic agents status: Secondary | ICD-10-CM

## 2023-09-02 DIAGNOSIS — Z885 Allergy status to narcotic agent status: Secondary | ICD-10-CM

## 2023-09-02 DIAGNOSIS — Z8042 Family history of malignant neoplasm of prostate: Secondary | ICD-10-CM

## 2023-09-02 DIAGNOSIS — Z90710 Acquired absence of both cervix and uterus: Secondary | ICD-10-CM

## 2023-09-02 DIAGNOSIS — E785 Hyperlipidemia, unspecified: Secondary | ICD-10-CM | POA: Diagnosis present

## 2023-09-02 DIAGNOSIS — G629 Polyneuropathy, unspecified: Secondary | ICD-10-CM | POA: Diagnosis present

## 2023-09-02 LAB — TROPONIN I (HIGH SENSITIVITY)
Troponin I (High Sensitivity): 56 ng/L — ABNORMAL HIGH (ref <18)
Troponin I (High Sensitivity): 1133 ng/L (ref <18)
Troponin I (High Sensitivity): 2130 ng/L (ref <18)
Troponin I (High Sensitivity): 2674 ng/L (ref <18)

## 2023-09-02 LAB — PREPARE RBC (CROSSMATCH)

## 2023-09-02 LAB — CBC
HCT: 29.9 % — ABNORMAL LOW (ref 36.0–46.0)
HCT: 33.1 % — ABNORMAL LOW (ref 36.0–46.0)
Hemoglobin: 9.7 g/dL — ABNORMAL LOW (ref 12.0–15.0)
Hemoglobin: 11.1 g/dL — ABNORMAL LOW (ref 12.0–15.0)
MCH: 31.2 pg (ref 26.0–34.0)
MCH: 31 pg (ref 26.0–34.0)
MCHC: 32.4 g/dL (ref 30.0–36.0)
MCHC: 33.5 g/dL (ref 30.0–36.0)
MCV: 96.1 fL (ref 80.0–100.0)
MCV: 92.5 fL (ref 80.0–100.0)
Platelets: 212 10*3/uL (ref 150–400)
Platelets: 194 10*3/uL (ref 150–400)
RBC: 3.11 MIL/uL — ABNORMAL LOW (ref 3.87–5.11)
RBC: 3.58 MIL/uL — ABNORMAL LOW (ref 3.87–5.11)
RDW: 13.1 % (ref 11.5–15.5)
RDW: 13.4 % (ref 11.5–15.5)
WBC: 7.9 10*3/uL (ref 4.0–10.5)
WBC: 14.4 10*3/uL — ABNORMAL HIGH (ref 4.0–10.5)
nRBC: 0 % (ref 0.0–0.2)
nRBC: 0 % (ref 0.0–0.2)

## 2023-09-02 LAB — COMPREHENSIVE METABOLIC PANEL WITH GFR
ALT: 20 U/L (ref 0–44)
AST: 29 U/L (ref 15–41)
Albumin: 2.9 g/dL — ABNORMAL LOW (ref 3.5–5.0)
Alkaline Phosphatase: 50 U/L (ref 38–126)
Anion gap: 10 (ref 5–15)
BUN: 23 mg/dL (ref 8–23)
CO2: 19 mmol/L — ABNORMAL LOW (ref 22–32)
Calcium: 7.9 mg/dL — ABNORMAL LOW (ref 8.9–10.3)
Chloride: 108 mmol/L (ref 98–111)
Creatinine, Ser: 0.83 mg/dL (ref 0.44–1.00)
GFR, Estimated: >60 mL/min (ref >60)
Glucose, Bld: 239 mg/dL — ABNORMAL HIGH (ref 70–99)
Potassium: 3.8 mmol/L (ref 3.5–5.1)
Sodium: 137 mmol/L (ref 135–145)
Total Bilirubin: 0.8 mg/dL (ref 0.0–1.2)
Total Protein: 4.3 g/dL — ABNORMAL LOW (ref 6.5–8.1)

## 2023-09-02 LAB — PROTIME-INR
INR: 1.3 — ABNORMAL HIGH (ref 0.8–1.2)
Prothrombin Time: 16.1 s — ABNORMAL HIGH (ref 11.4–15.2)

## 2023-09-02 MED ORDER — ACETAMINOPHEN 650 MG RE SUPP: 650.0000 mg | Freq: Four times a day (QID) | RECTAL | Status: DC | PRN

## 2023-09-02 MED ORDER — SODIUM CHLORIDE 0.9% IV SOLUTION
Freq: Once | INTRAVENOUS | Status: DC
  Filled 2023-09-02: qty 250

## 2023-09-02 MED ORDER — SODIUM CHLORIDE 0.9% FLUSH
3.0000 mL | Freq: Two times a day (BID) | INTRAVENOUS | Status: DC
  Administered 2023-09-02 – 2023-09-05 (×6): 3 mL via INTRAVENOUS

## 2023-09-02 MED ORDER — ONDANSETRON HCL 4 MG PO TABS: 4.0000 mg | ORAL_TABLET | Freq: Four times a day (QID) | ORAL | Status: DC | PRN

## 2023-09-02 MED ORDER — SODIUM CHLORIDE 0.9 % IV SOLN: 10.0000 mL/h | Freq: Once | INTRAVENOUS | Status: DC

## 2023-09-02 MED ORDER — LIDOCAINE HCL (PF) 1 % IJ SOLN
INTRAMUSCULAR | Status: AC
  Filled 2023-09-02: qty 5

## 2023-09-02 MED ORDER — ACETAMINOPHEN 325 MG PO TABS: 650.0000 mg | ORAL_TABLET | Freq: Four times a day (QID) | ORAL | Status: DC | PRN

## 2023-09-02 MED ORDER — ONDANSETRON HCL 4 MG/2ML IJ SOLN
4.0000 mg | Freq: Four times a day (QID) | INTRAMUSCULAR | Status: DC | PRN
Start: 2023-09-02 — End: 2023-09-05

## 2023-09-02 NOTE — ED Notes (Signed)
 MD Guss Legacy at bedside with this RN, assessing pts wound from earlier bleed. Gauze moderately saturated. RN grabbed additional Kerlix as well as ace wrap and gauze boat. Pt wound wrapped again. Pt soiled linens removed, pt changed into gown, and pt feet elevated some. Provider still at bedside.

## 2023-09-02 NOTE — ED Notes (Signed)
 Vascular provided with Lidocaine  1% for treatment of pts wound.

## 2023-09-02 NOTE — Assessment & Plan Note (Signed)
 Initial troponin of 56 with evidence of ischemia on EKG in the absence of chest pain due to severe and sudden hypotension secondary to hemorrhagic shock.  Low suspicion for active ACS with no prior history of CAD.  - Repeat EKG pending - Repeat troponin pending - Echocardiogram ordered - Hold off on heparin infusion

## 2023-09-02 NOTE — Consult Note (Signed)
 Hospital Consult    Reason for Consult:  Right Ankle Varicose Vein Bleed Requesting Physician:  Dr Iver Marker MD  MRN #:  [id number]  History of Present Illness: This is a 89 y.o. female With a history of HTN   Patient reports she has had a small scab over her right ankle for a few weeks time.  She itched it pulled it off and it started bleeding severely and heavily.  She reports that she went through at least 1 or 2 soaked towels was bleeding all over, and she called EMS when she started feeling lightheaded as though she was going to pass out but did not   EMS reports when they arrived that she was pulseless with agonal respirations, they provided brief BVM ventilation and prior to starting CPR she had return of circulation.  They report there was a massive at least amount of blood on the floor, and active bleeding from a scab over her right ankle which stopped bleeding after application of an [place] dressing   She has no chest pain no trouble breathing.  She reports she never had any sort of chest pain.  She has no history of any heart problems except for high blood pressure   Patient reports that she has numbness in both of her feet, this is normal for her due to neuropathy from her previous use of atenolol  as she reports it.  She still able to wiggle the toes of both feet.  She is not having pain in either foot.  She reports she never had any sort of chest pain or anything of that nature. Vascular Surgery consulted to evaluate.   Past Medical History:  Diagnosis Date   Diverticulitis    Glaucoma    High blood pressure    Osteoarthritis    knees   Osteoporosis 10/05/2015   Vitamin D  deficiency 09/18/2015    Past Surgical History:  Procedure Laterality Date   ABDOMINAL HYSTERECTOMY  2008   CATARACT EXTRACTION W/PHACO Right 02/27/2022   Procedure: Annell Kidney DUAL BLADE GONIOTOMY RIGHT;  Surgeon: Annell Kidney, MD;  Location: [facility];  Service: Ophthalmology;   Laterality: Right;   cataract surgery Bilateral 2011   CLOSED REDUCTION WRIST FRACTURE Right 12/23/2020   Procedure: CLOSED REDUCTION AND PERCUTANEOUS PINNING RIGHT WRIST;  Surgeon: Marlynn Singer, MD;  Location: [facility];  Service: Orthopedics;  Laterality: Right;   HIP ARTHROPLASTY Right 12/23/2020   Procedure: ARTHROPLASTY BIPOLAR HIP (HEMIARTHROPLASTY);  Surgeon: Marlynn Singer, MD;  Location: [facility];  Service: Orthopedics;  Laterality: Right;   ORIF ELBOW FRACTURE Right 12/23/2020   Procedure: OPEN REDUCTION INTERNAL FIXATION (ORIF) ELBOW/OLECRANON FRACTURE;  Surgeon: Marlynn Singer, MD;  Location: [facility];  Service: Orthopedics;  Laterality: Right;   TONSILLECTOMY AND ADENOIDECTOMY  1941    Allergies  Allergen Reactions   Codeine     Double Vision   Percocet [Oxycodone -Acetaminophen ]     Occular Migraine   Timolol Other (See Comments)    Pt states her heart stopped.    Anise Flavoring Agent (Non-Screening) Palpitations and Other (See Comments)    Feels like she is going to pass out   Ciprofloxacin Palpitations   Levofloxacin Palpitations   Licorice Flavor [Flavoring Agent (Non-Screening)] Palpitations    Prior to Admission medications   Medication Sig Start Date End Date Taking? Authorizing Provider  diltiazem (CARDIZEM) 120 MG tablet Take 60 mg by mouth in the morning and at bedtime.    [provider]  Magnesium  250 MG  TABS Take 2 tablets by mouth daily.    [provider]  Multiple Vitamins-Minerals (PRESERVISION AREDS) CAPS Take 1 capsule by mouth daily.    [provider]  VITAMIN A  PO Take 1 tablet by mouth daily. 10,000 units    [provider]  vitamin B-12 (CYANOCOBALAMIN ) 1000 MCG tablet Take 1,000 mcg by mouth daily.    [provider]  Vitamin D , Cholecalciferol , 10 MCG (400 UNIT) TABS Take 400 mg by mouth in the morning and at bedtime.    [provider]    Social History   Socioeconomic History   Marital  status: Widowed    Spouse name: Not on file   Number of children: Not on file   Years of education: Not on file   Highest education level: Not on file  Occupational History   Not on file  Tobacco Use   Smoking status: Never   Smokeless tobacco: Never  Vaping Use   Vaping status: Never Used  Substance and Sexual Activity   Alcohol use: Yes    Alcohol/week: 7.0 standard drinks of alcohol    Types: 7 Glasses of wine per week   Drug use: No   Sexual activity: Not Currently  Other Topics Concern   Not on file  Social History Narrative   Mother of Honore Lux   Social Drivers of Health   Financial Resource Strain: Low Risk  (11/11/2022)   Overall Financial Resource Strain (CARDIA)    Difficulty of Paying Living Expenses: Not hard at all  Food Insecurity: No Food Insecurity (11/11/2022)   Hunger Vital Sign    Worried About Running Out of Food in the Last Year: Never true    Ran Out of Food in the Last Year: Never true  Transportation Needs: No Transportation Needs (11/11/2022)   PRAPARE - Administrator, Civil Service (Medical): No    Lack of Transportation (Non-Medical): No  Physical Activity: Insufficiently Active (11/11/2022)   Exercise Vital Sign    Days of Exercise per Week: 7 days    Minutes of Exercise per Session: 20 min  Stress: No Stress Concern Present (11/11/2022)   [employer] of Occupational Health - Occupational Stress Questionnaire    Feeling of Stress : Not at all  Social Connections: Socially Isolated (11/11/2022)   Social Connection and Isolation Panel [NHANES]    Frequency of Communication with Friends and Family: Three times a week    Frequency of Social Gatherings with Friends and Family: Three times a week    Attends Religious Services: Never    Active Member of Clubs or Organizations: No    Attends Banker Meetings: Never    Marital Status: Widowed  Intimate Partner Violence: Not At Risk (11/11/2022)   Humiliation, Afraid,  Rape, and Kick questionnaire    Fear of Current or Ex-Partner: No    Emotionally Abused: No    Physically Abused: No    Sexually Abused: No     Family History  Problem Relation Age of Onset   Cancer Father        prostate   Colon cancer Neg Hx     ROS: Otherwise negative unless mentioned in HPI  Physical Examination  Vitals:   09/02/23 1218 09/02/23 1233  BP: (!) 91/39 (!) 102/43  Pulse: 60 71  Resp: 19 16  Temp: (!) 97.5 F (36.4 C) (!) 97.4 F (36.3 C)  SpO2: 100% 100%   Body mass index is 17.72 kg/m.  General:  WDWN in NAD Gait: Not observed HENT: WNL, normocephalic Pulmonary: normal non-labored breathing, without Rales, rhonchi,  wheezing Cardiac: regular, without  Murmurs, rubs or gallops; without carotid bruits Abdomen: Positive bowel sounds throughout, soft, NT/ND, no masses Skin: without rashes Vascular Exam/Pulses: Palpable pulses throughout.All extremities are cool to touch. Good capillary refill. Extremities: without ischemic changes, without Gangrene , without cellulitis; with open wounds;  Musculoskeletal: no muscle wasting or atrophy  Neurologic: A&O X 3;  No focal weakness or paresthesias are detected; speech is fluent/normal Psychiatric:  The pt has Normal affect. Lymph:  Unremarkable  CBC    Component Value Date/Time   WBC 7.9 09/02/2023 1149   RBC 3.11 (L) 09/02/2023 1149   HGB 9.7 (L) 09/02/2023 1149   HCT 29.9 (L) 09/02/2023 1149   PLT 212 09/02/2023 1149   MCV 96.1 09/02/2023 1149   MCH 31.2 09/02/2023 1149   MCHC 32.4 09/02/2023 1149   RDW 13.1 09/02/2023 1149   LYMPHSABS 0.7 12/18/2022 0900   MONOABS 0.5 12/18/2022 0900   EOSABS 0.0 12/18/2022 0900   BASOSABS 0.1 12/18/2022 0900    BMET    Component Value Date/Time   NA 137 09/02/2023 1149   K 3.8 09/02/2023 1149   CL 108 09/02/2023 1149   CO2 19 (L) 09/02/2023 1149   GLUCOSE 239 (H) 09/02/2023 1149   BUN 23 09/02/2023 1149   CREATININE 0.83 09/02/2023 1149   CALCIUM  7.9  (L) 09/02/2023 1149   GFRNONAA >60 09/02/2023 1149   GFRAA >60 02/24/2015 1836    COAGS: Lab Results  Component Value Date   INR 1.3 (H) 09/02/2023   INR 1.1 12/22/2020     Non-Invasive Vascular Imaging:   None   Statin:  No. Beta Blocker:  No. Aspirin :  No. ACEI:  No. ARB:  No. CCB use:  Yes Other antiplatelets/anticoagulants:  No.    ASSESSMENT/PLAN: This is a 89 y.o. female who presents to [facility] emergency department due to significant bleeding from a scab she picked off a varicose vein on her right ankle.  I remove the dressing today in the emergency department and she had a vein that appeared to have had a hole in it where it was picked at that was continuously bleeding.  I redressed it with a package of gauze wrapped with Kerlix tightly wrapped with an Ace bandage to apply pressure to stop the bleeding.  It appears this may have been a varicose vein that will not need to be sclerosed.  Dr. Devon Fogo MD was made aware of the patient's situation and the plan is to sclerosis this area at the bedside later this afternoon.   -I discussed the case in detail with Dr. Devon Fogo MD and he agrees with the plan.   Annamaria Barrette [facility] 09/02/2023 1:46 PM

## 2023-09-02 NOTE — ED Notes (Signed)
 Notified provider of critical lab result.

## 2023-09-02 NOTE — Assessment & Plan Note (Addendum)
 Vascular surgery consulted due to severe bleed from lower extremity with their examination concerning for ruptured varicose vein.  Unclear need for intervention at this time, possible veins ProSys later today.  - Vascular surgery consulted; appreciate their recommendations - Continue pressure dressing for now

## 2023-09-02 NOTE — ED Notes (Addendum)
 After consulting inpatient provider, 2nd unit of blood not needed at this time. RN to update provider for any low BP or profuse bleeding.

## 2023-09-02 NOTE — ED Notes (Signed)
 MD at bedside. Pedal pulse is palpable. RN took of ACE wrap. Bleeding is still controlled at this time. RN placed warm blanket around pt right lower ext for warmth.

## 2023-09-02 NOTE — ED Notes (Signed)
 Pt stood after utilizing commode and saw that gauze was soaked in dark red blood. Pt assisted back to bed with help of this RN. Saturated gauze was removed and then replaced with new gauze, wrapped tightly. MD Guss Legacy made aware of incident.

## 2023-09-02 NOTE — ED Notes (Signed)
 Pt assisted to bedside commode at this time after expressing need to urinate. Pt steady on feet, denies pain, no sign of bleeding from wound.

## 2023-09-02 NOTE — ED Provider Notes (Signed)
 [facility] Provider Note    Event Date/Time   First MD Initiated Contact with Patient 09/02/23 1153     (approximate)   History   Unresponsive   HPI  Anne Ferrell is a 89 y.o. female   with a history of high blood pressure  Patient reports she has had a small scab over her right ankle for a few weeks time.  She itched it pulled it off and it started bleeding severely and heavily.  She reports that she went through at least 1 or 2 soaked towels was bleeding all over, and she called EMS when she started feeling lightheaded as though she was going to pass out but did not  EMS reports when they arrived that she was pulseless with agonal respirations, they provided brief BVM ventilation and prior to starting CPR she had return of circulation.  They report there was a massive at least amount of blood on the floor, and active bleeding from a scab over her right ankle which stopped bleeding after application of an [place] dressing  She has no chest pain no trouble breathing.  She reports she never had any sort of chest pain.  She has no history of any heart problems except for high blood pressure    Patient reports that she has numbness in both of her feet, this is normal for her due to neuropathy from her previous use of atenolol  as she reports it.  She still able to wiggle the toes of both feet.  She is not having pain in either foot.  She reports she never had any sort of chest pain or anything of that nature  Physical Exam   Triage Vital Signs: ED Triage Vitals  Encounter Vitals Group     BP 09/02/23 1147 (!) 117/43     Systolic BP Percentile --      Diastolic BP Percentile --      Pulse Rate 09/02/23 1147 81     Resp 09/02/23 1147 12     Temp 09/02/23 1147 97.7 F (36.5 C)     Temp Source 09/02/23 1147 Oral     SpO2 09/02/23 1147 100 %     Weight 09/02/23 1145 120 lb (54.4 kg)     Height 09/02/23 1145 5\' 9"  (1.753 m)     Head Circumference --       Peak Flow --      Pain Score 09/02/23 1144 0     Pain Loc --      Pain Education --      Exclude from Growth Chart --     Most recent vital signs: Vitals:   09/02/23 1218 09/02/23 1233  BP: (!) 91/39 (!) 102/43  Pulse: 60 71  Resp: 19 16  Temp: (!) 97.5 F (36.4 C) (!) 97.4 F (36.3 C)  SpO2: 100% 100%     General: Awake, no distress.  Very pale in appearance.  In no acute distress.  Pleasant and oriented. CV:  Good peripheral perfusion.  Palpable dorsalis pedis pulses in both feet.  Slow capillary refill in all digits of all extremities.  No discrepancy noted in the right foot. Resp:  Normal effort.  Clear bilateral Abd:  No distention.  Other:  Patient with dried blood noted on her shirt hands and bottom of both feet.  An [place] presser dressing is applied to the right ankle medially with no evidence of ongoing bleeding.   ED Results / Procedures / Treatments  Labs (all labs ordered are listed, but only abnormal results are displayed) Labs Reviewed  CBC - Abnormal; Notable for the following components:      Result Value   RBC 3.11 (*)    Hemoglobin 9.7 (*)    HCT 29.9 (*)    All other components within normal limits  COMPREHENSIVE METABOLIC PANEL WITH GFR - Abnormal; Notable for the following components:   CO2 19 (*)    Glucose, Bld 239 (*)    Calcium  7.9 (*)    Total Protein 4.3 (*)    Albumin 2.9 (*)    All other components within normal limits  PROTIME-INR - Abnormal; Notable for the following components:   Prothrombin Time 16.1 (*)    INR 1.3 (*)    All other components within normal limits  TROPONIN I (HIGH SENSITIVITY) - Abnormal; Notable for the following components:   Troponin I (High Sensitivity) 56 (*)    All other components within normal limits  TYPE AND SCREEN  PREPARE RBC (CROSSMATCH)  PREPARE RBC (CROSSMATCH)     EKG  Interpreted by me at 1150 heart rate 75 QRS 90 QTc 480 ST segment elevation noted in inferolateral distribution.   Consistent with acute ischemia.  ECG could be diagnostic of STEMI however in this clinical setting with acute blood loss generalized weakness no associated chest pain I suspect this is most representative likely a demand type ischemia in light we does not represent acute STEMI from thromboembolic cause   RADIOLOGY     PROCEDURES:  Critical Care performed: Yes, see critical care procedure note(s)  CRITICAL CARE Performed by: Iver Marker   Total critical care time: 30 minutes  Critical care time was exclusive of separately billable procedures and treating other patients.  Critical care was necessary to treat or prevent imminent or life-threatening deterioration.  Critical care was time spent personally by me on the following activities: development of treatment plan with patient and/or surrogate as well as nursing, discussions with consultants, evaluation of patient's response to treatment, examination of patient, obtaining history from patient or surrogate, ordering and performing treatments and interventions, ordering and review of laboratory studies, ordering and review of radiographic studies, pulse oximetry and re-evaluation of patient's condition.  Procedures   MEDICATIONS ORDERED IN ED: Medications  0.9 %  sodium chloride  infusion (Manually program via Guardrails IV Fluids) (0 mLs Intravenous Hold 09/02/23 1200)  0.9 %  sodium chloride  infusion (0 mL/hr Intravenous Hold 09/02/23 1200)     IMPRESSION / MDM / ASSESSMENT AND PLAN / ED COURSE  I reviewed the triage vital signs and the nursing notes.                              Differential diagnosis includes, but is not limited to, life-threatening or exsanguinating hemorrhage from a right ankle wound.  I left the [place] dressing in place that she has no evidence that would support distal ischemic picture, intact neurovascular exam including chronic paresthesia of the right foot.  Bleeding is controlled at this time, she appears  quite pale and at presentation given the clinical history and degree of bleeding 1 unit of emergency release blood is ordered as well as 1 additional unit of typed crossmatched blood once available.  She is currently hemodynamically stable and appears ashen pale with a clinical history of severe blood loss coming from a wound over the right ankle  Due to the degree of reported  bleeding from the ankle wound as well as apparent acute blood loss with shocklike findings and initial pulseless status quickly improving upon EMS resuscitation I have consulted vascular surgery for further evaluation of the right ankle wound should further acute exsanguinating bleeding occur.  Currently controlled  Consented the patient verbally discussed risks benefits and alternatives to receiving blood product, she is agreeable to proceed with blood transfusion on an emergent basis and also agreeable and consenting to further blood products as needed.  Patient's presentation is most consistent with acute presentation with potential threat to life or bodily function.   ----------------------------------------- 1:02 PM on 09/02/2023 -----------------------------------------  Hemoglobin 9.7, this does represent an acute departure from her previous norms.  Troponin is elevated with no associated chest pain or cardiac symptoms this appears to be consistent with demand ischemia I suspect likely due to severe hemorrhagic type shock that was occurring earlier.  She is now awake alert fully oriented hemodynamics with just very mild hypotension but fully alert and in no distress with no ongoing bleeding.  Currently receiving blood transfusion.  Vascular surgery has seen and evaluated and they recommend continuation of current wound dressing and plan for further wound evaluation once vascular surgeon is available but at this time there is no active ongoing bleeding on reassessment.  Patient currently has his really dressing in place.   Patient and her daughter both understand agreeable plan for admission.  Consulted with and patient accepted to hospitalist service by Dr. Guss Legacy With vascular following     FINAL CLINICAL IMPRESSION(S) / ED DIAGNOSES   Final diagnoses:  Hemorrhagic shock (HCC)  Hemorrhage  Demand ischemia (HCC)  ST elevation     Rx / DC Orders   ED Discharge Orders     None        Note:  This document was prepared using Dragon voice recognition software and may include unintentional dictation errors.   Iver Marker, MD 09/02/23 1318

## 2023-09-02 NOTE — ED Notes (Addendum)
 Pt reporting to ED d/t unresponsiveness after picking wound that began to bleed profusely at home. Estimated loss of blood was 1.5-2 liters. Pt stable at this time with daughter at the bedside. Pt aox4. GCS 15. Pt ABCs intact. RR even and unlabored. Pt in NAD. Bed in lowest locked position. Call bell in reach. Denies needs at this time.   Past Medical History:  Diagnosis Date   Diverticulitis    Glaucoma    High blood pressure    Osteoarthritis    knees   Osteoporosis 10/05/2015   Vitamin D  deficiency 09/18/2015

## 2023-09-02 NOTE — Assessment & Plan Note (Signed)
-   Hold home diltiazem

## 2023-09-02 NOTE — ED Triage Notes (Signed)
 Pt called EMS due to a scab that she pulled off her scab that is on the inside of the right ankle and started to have severe bleeding. EMS arrived to find pt unresponsive laying in a pool of blood that was approx 2 liters per EMS and pt had agonal respirations and was pulseless. EMS sts that CPR was not initiated. EMS placed two large bore IV's and started to bolus pt with IV fluid. Pt is A/Ox4 at this time while laying in the supine position.

## 2023-09-02 NOTE — ED Notes (Signed)
 RN called into pt room due to foot pain on the right foot. Pt has pressure dressing on that foot to stop bleeding. Pt sts that she is getting cramps in her left foot and RN assesed pt foot. Pt foot is dusty in color and cool to the touch. Pt leg above the wrap is warm and pink. Per MD, she is going to call vascular.

## 2023-09-02 NOTE — ED Notes (Addendum)
 Pt PIV dressing changed at this time. During blood draw, dressing became soiled. Area dried and replaced with clean dressing. Pt ABCs intact. RR even and unlabored. Pt in NAD. Bed in lowest locked position. Call bell in reach. Denies further needs at this time.

## 2023-09-02 NOTE — H&P (Signed)
 History and Physical    Patient: Anne Ferrell ZOX:[id number] DOB: 1932/09/22 DOA: 09/02/2023 DOS: the patient was seen and examined on 09/02/2023 PCP: Scherrie Curt, MD  Patient coming from: Home  Chief Complaint:  Chief Complaint  Patient presents with   Unresponsive   HPI: Anne Ferrell is a 89 y.o. female with medical history significant of Hypertension, congenital mitral valve prolapse, hyperlipidemia, aortic valve sclerosis, osteoporosis, who presents to the ED due to unresponsiveness.  [name] states that she was in her usual state of health earlier today when she noticed a scab on her right ankle.  She picked the scab and noted sudden onset severe bleeding from the spot.  She soaked approximately 2 towels full of blood.  Then she began to feel faint with shortness of breath and lost consciousness.  Prior to doing so, she was on the phone with her daughter and so EMS was called.  Per chart review, on EMS arrival, patient was lying in a large pool of blood approximated to be at least 2 L, agonal breathing.  Pulses were unable to be palpated.  She was bagged several times and then pulses returned without initiation of CPR.  ED course: On arrival to the ED, patient's blood pressure was 117/43 with heart rate of 74.  She was saturating at 100% on room air.  She was afebrile at 97.7.  Initial workup with hemoglobin of 9.7, glucose 239, bicarb 19, creatinine 0.83 with GFR above 60.  Troponin 56.  INR 1.3.  EKG with ST elevations concerning for demand.  Heparin not started due to acute bleed.  Vascular surgery consulted and [facility] contacted for admission.  Review of Systems: As mentioned in the history of present illness. All other systems reviewed and are negative.  Past Medical History:  Diagnosis Date   Diverticulitis    Glaucoma    High blood pressure    Osteoarthritis    knees   Osteoporosis 10/05/2015   Vitamin D  deficiency 09/18/2015   Past Surgical History:   Procedure Laterality Date   ABDOMINAL HYSTERECTOMY  2008   CATARACT EXTRACTION W/PHACO Right 02/27/2022   Procedure: Annell Kidney DUAL BLADE GONIOTOMY RIGHT;  Surgeon: Annell Kidney, MD;  Location: [facility];  Service: Ophthalmology;  Laterality: Right;   cataract surgery Bilateral 2011   CLOSED REDUCTION WRIST FRACTURE Right 12/23/2020   Procedure: CLOSED REDUCTION AND PERCUTANEOUS PINNING RIGHT WRIST;  Surgeon: Marlynn Singer, MD;  Location: [facility];  Service: Orthopedics;  Laterality: Right;   HIP ARTHROPLASTY Right 12/23/2020   Procedure: ARTHROPLASTY BIPOLAR HIP (HEMIARTHROPLASTY);  Surgeon: Marlynn Singer, MD;  Location: [facility];  Service: Orthopedics;  Laterality: Right;   ORIF ELBOW FRACTURE Right 12/23/2020   Procedure: OPEN REDUCTION INTERNAL FIXATION (ORIF) ELBOW/OLECRANON FRACTURE;  Surgeon: Marlynn Singer, MD;  Location: [facility];  Service: Orthopedics;  Laterality: Right;   TONSILLECTOMY AND ADENOIDECTOMY  1941   Social History:  reports that she has never smoked. She has never used smokeless tobacco. She reports current alcohol use of about 7.0 standard drinks of alcohol per week. She reports that she does not use drugs.  Allergies  Allergen Reactions   Codeine     Double Vision   Percocet [Oxycodone -Acetaminophen ]     Occular Migraine   Timolol Other (See Comments)    Pt states her heart stopped.    Anise Flavoring Agent (Non-Screening) Palpitations and Other (See Comments)    Feels like she is going to pass out   Ciprofloxacin Palpitations  Levofloxacin Palpitations   Licorice Flavor [Flavoring Agent (Non-Screening)] Palpitations    Family History  Problem Relation Age of Onset   Cancer Father        prostate   Colon cancer Neg Hx     Prior to Admission medications   Medication Sig Start Date End Date Taking? Authorizing Provider  diltiazem (CARDIZEM) 120 MG tablet Take 60 mg by mouth in the morning and at bedtime.    [provider]   Magnesium  250 MG TABS Take 2 tablets by mouth daily.    [provider]  Multiple Vitamins-Minerals (PRESERVISION AREDS) CAPS Take 1 capsule by mouth daily.    [provider]  VITAMIN A  PO Take 1 tablet by mouth daily. 10,000 units    [provider]  vitamin B-12 (CYANOCOBALAMIN ) 1000 MCG tablet Take 1,000 mcg by mouth daily.    [provider]  Vitamin D , Cholecalciferol , 10 MCG (400 UNIT) TABS Take 400 mg by mouth in the morning and at bedtime.    [provider]    Physical Exam: Vitals:   09/02/23 1145 09/02/23 1147 09/02/23 1218 09/02/23 1233  BP:  (!) 117/43 (!) 91/39 (!) 102/43  Pulse:  81 60 71  Resp:  12 19 16   Temp:  97.7 F (36.5 C) (!) 97.5 F (36.4 C) (!) 97.4 F (36.3 C)  TempSrc:  Oral Oral Oral  SpO2:  100% 100% 100%  Weight: 54.4 kg     Height: 5\' 9"  (1.753 m)      Physical Exam Vitals and nursing note reviewed.  Constitutional:      General: She is not in acute distress. HENT:     Head: Normocephalic and atraumatic.     Mouth/Throat:     Mouth: Mucous membranes are moist.     Pharynx: Oropharynx is clear.  Eyes:     Conjunctiva/sclera: Conjunctivae normal.     Pupils: Pupils are equal, round, and reactive to light.  Cardiovascular:     Rate and Rhythm: Normal rate and regular rhythm.     Heart sounds: Murmur (4/6 heard throughout) heard.  Pulmonary:     Effort: Pulmonary effort is normal. No respiratory distress.     Breath sounds: Normal breath sounds. No wheezing, rhonchi or rales.  Abdominal:     General: There is no distension.     Palpations: Abdomen is soft.  Musculoskeletal:     Right lower leg: No edema.     Left lower leg: No edema.  Skin:    Coloration: Skin is pale.     Comments: Dried blood covering bilateral lower extremities.  No active bleeding, as right lower leg Ace wrapped tightly.  Neurological:     Mental Status: She is alert and oriented to person, place, and time. Mental status  is at baseline.  Psychiatric:        Mood and Affect: Mood normal.        Behavior: Behavior normal.    Data Reviewed: CBC with WBC of 7.9, hemoglobin of 9.7, platelets 212 CMP with sodium of 137, potassium 3.8, bicarb 19, glucose 239, BUN 23, creatinine 0.83, AST 29, ALT 20, GFR above 60 Troponin 56 INR 1.3  EKG appears only reviewed sinus rhythm with rate of 74.  ST elevations in both lateral and inferior leads, minimal approximately 1 to 2 mm.  Repeat EKG pending  Results are pending, will review when available.  Assessment and Plan:  * Hemorrhagic shock [facility]) Patient is presenting  with hemorrhagic shock in the setting of a ruptured varicose vein with estimated 2 L of blood loss by EMS.  Transfusion has been initiated in the ED, but patient's blood pressure has recovered well.  - 2 units of packed RBC ordered total - CBC twice daily - No indication for pressors at this time - Hold home antihypertensives  Demand ischemia (HCC) Initial troponin of 56 with evidence of ischemia on EKG in the absence of chest pain due to severe and sudden hypotension secondary to hemorrhagic shock.  Low suspicion for active ACS with no prior history of CAD.  - Repeat EKG pending - Repeat troponin pending - Echocardiogram ordered - Hold off on heparin infusion  Varicose veins of right lower extremity with ulcer of ankle limited to breakdown of skin [facility]) Vascular surgery consulted due to severe bleed from lower extremity with their examination concerning for ruptured varicose vein.  Unclear need for intervention at this time, possible veins ProSys later today.  - Vascular surgery consulted; appreciate their recommendations - Continue pressure dressing for now  Essential hypertension - Hold home diltiazem  Advance Care Planning:   Code Status: Full Code Verified  Consults: Vascular surgery  Family Communication: Patient's daughter updated at bedside  Severity of Illness: The  appropriate patient status for this patient is OBSERVATION. Observation status is judged to be reasonable and necessary in order to provide the required intensity of service to ensure the patient's safety. The patient's presenting symptoms, physical exam findings, and initial radiographic and laboratory data in the context of their medical condition is felt to place them at decreased risk for further clinical deterioration. Furthermore, it is anticipated that the patient will be medically stable for discharge from the hospital within 2 midnights of admission.   Author: Avi Body, MD 09/02/2023 2:09 PM  For on call review [email or web address].

## 2023-09-02 NOTE — Assessment & Plan Note (Signed)
 Patient is presenting with hemorrhagic shock in the setting of a ruptured varicose vein with estimated 2 L of blood loss by EMS.  Transfusion has been initiated in the ED, but patient's blood pressure has recovered well.  - 2 units of packed RBC ordered total - CBC twice daily - No indication for pressors at this time - Hold home antihypertensives

## 2023-09-03 ENCOUNTER — Inpatient Hospital Stay
Admit: 2023-09-03 | Discharge: 2023-09-03 | Disposition: A | Discharge status: Home / Self Care | Attending: Internal Medicine

## 2023-09-03 DIAGNOSIS — E869 Volume depletion, unspecified: Secondary | ICD-10-CM | POA: Diagnosis present

## 2023-09-03 DIAGNOSIS — I358 Other nonrheumatic aortic valve disorders: Secondary | ICD-10-CM | POA: Diagnosis present

## 2023-09-03 DIAGNOSIS — G629 Polyneuropathy, unspecified: Secondary | ICD-10-CM | POA: Diagnosis present

## 2023-09-03 DIAGNOSIS — Z881 Allergy status to other antibiotic agents status: Secondary | ICD-10-CM | POA: Diagnosis not present

## 2023-09-03 DIAGNOSIS — Z885 Allergy status to narcotic agent status: Secondary | ICD-10-CM | POA: Diagnosis not present

## 2023-09-03 DIAGNOSIS — I340 Nonrheumatic mitral (valve) insufficiency: Secondary | ICD-10-CM | POA: Diagnosis present

## 2023-09-03 DIAGNOSIS — R578 Other shock: Secondary | ICD-10-CM | POA: Diagnosis not present

## 2023-09-03 DIAGNOSIS — L97919 Non-pressure chronic ulcer of unspecified part of right lower leg with unspecified severity: Secondary | ICD-10-CM | POA: Diagnosis present

## 2023-09-03 DIAGNOSIS — Z90710 Acquired absence of both cervix and uterus: Secondary | ICD-10-CM | POA: Diagnosis not present

## 2023-09-03 DIAGNOSIS — Z79899 Other long term (current) drug therapy: Secondary | ICD-10-CM | POA: Diagnosis not present

## 2023-09-03 DIAGNOSIS — E785 Hyperlipidemia, unspecified: Secondary | ICD-10-CM | POA: Diagnosis present

## 2023-09-03 DIAGNOSIS — I5022 Chronic systolic (congestive) heart failure: Secondary | ICD-10-CM | POA: Diagnosis present

## 2023-09-03 DIAGNOSIS — R571 Hypovolemic shock: Secondary | ICD-10-CM | POA: Diagnosis not present

## 2023-09-03 DIAGNOSIS — M810 Age-related osteoporosis without current pathological fracture: Secondary | ICD-10-CM | POA: Diagnosis present

## 2023-09-03 DIAGNOSIS — I341 Nonrheumatic mitral (valve) prolapse: Secondary | ICD-10-CM | POA: Diagnosis present

## 2023-09-03 DIAGNOSIS — I83013 Varicose veins of right lower extremity with ulcer of ankle: Secondary | ICD-10-CM | POA: Diagnosis not present

## 2023-09-03 DIAGNOSIS — I83891 Varicose veins of right lower extremities with other complications: Secondary | ICD-10-CM

## 2023-09-03 DIAGNOSIS — I5181 Takotsubo syndrome: Secondary | ICD-10-CM | POA: Diagnosis present

## 2023-09-03 DIAGNOSIS — Z7902 Long term (current) use of antithrombotics/antiplatelets: Secondary | ICD-10-CM | POA: Diagnosis not present

## 2023-09-03 DIAGNOSIS — H409 Unspecified glaucoma: Secondary | ICD-10-CM | POA: Diagnosis present

## 2023-09-03 DIAGNOSIS — Z7982 Long term (current) use of aspirin: Secondary | ICD-10-CM | POA: Diagnosis not present

## 2023-09-03 DIAGNOSIS — D62 Acute posthemorrhagic anemia: Secondary | ICD-10-CM | POA: Diagnosis present

## 2023-09-03 DIAGNOSIS — Z96641 Presence of right artificial hip joint: Secondary | ICD-10-CM | POA: Diagnosis present

## 2023-09-03 DIAGNOSIS — I872 Venous insufficiency (chronic) (peripheral): Secondary | ICD-10-CM | POA: Diagnosis present

## 2023-09-03 DIAGNOSIS — M170 Bilateral primary osteoarthritis of knee: Secondary | ICD-10-CM | POA: Diagnosis present

## 2023-09-03 DIAGNOSIS — I110 Hypertensive heart disease with heart failure: Secondary | ICD-10-CM | POA: Diagnosis present

## 2023-09-03 DIAGNOSIS — D500 Iron deficiency anemia secondary to blood loss (chronic): Secondary | ICD-10-CM | POA: Diagnosis present

## 2023-09-03 LAB — CBC
HCT: 29.6 % — ABNORMAL LOW (ref 36.0–46.0)
Hemoglobin: 10 g/dL — ABNORMAL LOW (ref 12.0–15.0)
MCH: 31.5 pg (ref 26.0–34.0)
MCHC: 33.8 g/dL (ref 30.0–36.0)
MCV: 93.4 fL (ref 80.0–100.0)
Platelets: 177 10*3/uL (ref 150–400)
RBC: 3.17 MIL/uL — ABNORMAL LOW (ref 3.87–5.11)
RDW: 13.4 % (ref 11.5–15.5)
WBC: 9.8 10*3/uL (ref 4.0–10.5)
nRBC: 0 % (ref 0.0–0.2)

## 2023-09-03 LAB — PREPARE RBC (CROSSMATCH)

## 2023-09-03 LAB — ECHOCARDIOGRAM LIMITED
AR max vel: 4.73 cm2
AV Area VTI: 5.88 cm2
AV Area mean vel: 6.1 cm2
AV Mean grad: 39.3 mmHg
AV Peak grad: 97.7 mmHg
Ao pk vel: 4.94 m/s
Calc EF: 29 %
Height: 69 in
S' Lateral: 2.4 cm
Single Plane A2C EF: 21.1 %
Single Plane A4C EF: 34.4 %
Weight: 1920 [oz_av]

## 2023-09-03 LAB — BASIC METABOLIC PANEL WITH GFR
Anion gap: 8 (ref 5–15)
BUN: 19 mg/dL (ref 8–23)
CO2: 22 mmol/L (ref 22–32)
Calcium: 8.1 mg/dL — ABNORMAL LOW (ref 8.9–10.3)
Chloride: 107 mmol/L (ref 98–111)
Creatinine, Ser: 0.73 mg/dL (ref 0.44–1.00)
GFR, Estimated: >60 mL/min (ref >60)
Glucose, Bld: 115 mg/dL — ABNORMAL HIGH (ref 70–99)
Potassium: 3.9 mmol/L (ref 3.5–5.1)
Sodium: 137 mmol/L (ref 135–145)

## 2023-09-03 LAB — PROTIME-INR
INR: 1.1 (ref 0.8–1.2)
Prothrombin Time: 14.9 s (ref 11.4–15.2)

## 2023-09-03 LAB — TROPONIN I (HIGH SENSITIVITY): Troponin I (High Sensitivity): 1781 ng/L (ref <18)

## 2023-09-03 MED ORDER — HEPARIN (PORCINE) 25000 UT/250ML-% IV SOLN
850.0000 [IU]/h | INTRAVENOUS | Status: DC
  Administered 2023-09-03: 650 [IU]/h via INTRAVENOUS
  Filled 2023-09-03: qty 250

## 2023-09-03 MED ORDER — SODIUM CHLORIDE 0.9 % IV SOLN: INTRAVENOUS | Status: AC

## 2023-09-03 MED ORDER — SODIUM CHLORIDE 0.9% FLUSH: 3.0000 mL | INTRAVENOUS | Status: DC | PRN

## 2023-09-03 MED ORDER — HEPARIN BOLUS VIA INFUSION
1600.0000 [IU] | Freq: Once | INTRAVENOUS | Status: AC
  Administered 2023-09-03: 1600 [IU] via INTRAVENOUS
  Filled 2023-09-03: qty 1600

## 2023-09-03 MED ORDER — ASPIRIN 81 MG PO CHEW
81.0000 mg | CHEWABLE_TABLET | ORAL | Status: AC
  Administered 2023-09-04: 81 mg via ORAL
  Filled 2023-09-03: qty 1

## 2023-09-03 MED ORDER — SODIUM CHLORIDE 0.9 % IV SOLN: INTRAVENOUS | Status: DC

## 2023-09-03 MED ORDER — PERFLUTREN LIPID MICROSPHERE
1.0000 mL | INTRAVENOUS | Status: AC | PRN
Start: 2023-09-03 — End: 2023-09-03
  Administered 2023-09-03: 2 mL via INTRAVENOUS

## 2023-09-03 MED ORDER — ASPIRIN 81 MG PO TBEC
81.0000 mg | DELAYED_RELEASE_TABLET | Freq: Every day | ORAL | Status: DC
Start: 2023-09-03 — End: 2023-09-05
  Administered 2023-09-03 – 2023-09-05 (×2): 81 mg via ORAL
  Filled 2023-09-03 (×2): qty 1

## 2023-09-03 MED ORDER — DILTIAZEM HCL 60 MG PO TABS
60.0000 mg | ORAL_TABLET | Freq: Two times a day (BID) | ORAL | Status: DC
  Administered 2023-09-03: 60 mg via ORAL
  Filled 2023-09-03: qty 1

## 2023-09-03 MED ORDER — SODIUM CHLORIDE 0.9% FLUSH
3.0000 mL | Freq: Two times a day (BID) | INTRAVENOUS | Status: DC
  Administered 2023-09-04 (×2): 3 mL via INTRAVENOUS

## 2023-09-03 MED ORDER — SODIUM CHLORIDE 0.9 % IV SOLN: 250.0000 mL | INTRAVENOUS | Status: DC | PRN

## 2023-09-03 MED ORDER — HEPARIN BOLUS VIA INFUSION
3200.0000 [IU] | Freq: Once | INTRAVENOUS | Status: DC
  Filled 2023-09-03: qty 3200

## 2023-09-03 NOTE — Progress Notes (Signed)
*  PRELIMINARY RESULTS* Echocardiogram 2D Echocardiogram has been performed.  Anne Ferrell 09/03/2023, 2:48 PM

## 2023-09-03 NOTE — Op Note (Signed)
    OPERATIVE NOTE   PROCEDURE: 1.  Ligation of bleeding varicosity with 6-0 Vicryl right ankle. 2.  Injection sclerotherapy with hypertonic saline right ankle varicose veins. 3.  Application of an Unna boot right leg  PRE-OPERATIVE DIAGNOSIS:  1.  Massive hemorrhage from a right ankle varicose vein 2.  Symptomatic varicosities right lower extremity 3.  Venous insufficiency bilateral lower extremities  POST-OPERATIVE DIAGNOSIS: Same  SURGEON: Devon Fogo  ASSISTANT(S): None  ANESTHESIA: local  ESTIMATED BLOOD LOSS: 25 cc  FINDING(S): Prominent varicosity of the right ankle which had ulcerated and was continuing to bleed.  Massive blood loss reported at the scene.  Numerous large varicosities surrounding this lesion.  SPECIMEN(S): None  INDICATIONS:   Anne Ferrell is a 89 y.o. female who presents with reported hypotensive shock secondary to massive blood loss from a bleeding varicosity.  She was seen by EMS and brought to the emergency room.  There was a reported arrest at the scene at the very least hypotensive shock had occurred.  Risks and benefits for treatment including sclerotherapy were reviewed with the patient and family all questions were answered all were in agreement with proceeding.  DESCRIPTION: After full informed written consent was obtained from the patient, the patient was brought back to the operating room and placed supine in her bed in the ER.   Upon removing the dressing persistent bleeding was noted from an ulcerated area overlying a large varicose vein.  The patient's right ankle was then prepped and draped in the standard fashion for a treatment of her bleeding varix.  Initially while holding direct pressure on the ulcerated area hypertonic saline was used to inject in the prominent varicosities surrounding the lesion.  A total of 2-1/2 cc was injected.  Light pressure was then held.  Bleeding was persistent and therefore a lidocaine  was  infiltrated into the skin surrounding the wound and a 6-0 Vicryl figure-of-eight suture was placed with immediate cessation of bleeding.  The ankle was then cleansed the site reinspected and was stable.  I then placed the patient in a zinc oxide Unna wrap from the base of the toes to the knee followed by a Coban as a protective layer.  Patient will follow-up with me this coming Monday for removal of the Unna boot and reevaluation of the ankle area.   The patient tolerated this procedure well.   COMPLICATIONS: None  CONDITION: [facility]  Office: [phone]   09/03/2023, 9:26 AM

## 2023-09-03 NOTE — Progress Notes (Signed)
 PHARMACY - ANTICOAGULATION CONSULT NOTE  Pharmacy Consult for heparin Indication: chest pain/ACS  Allergies  Allergen Reactions   Codeine     Double Vision   Percocet [Oxycodone -Acetaminophen ]     Occular Migraine   Timolol Other (See Comments)    Pt states her heart stopped.    Anise Flavoring Agent (Non-Screening) Palpitations and Other (See Comments)    Feels like she is going to pass out   Ciprofloxacin Palpitations   Levofloxacin Palpitations   Licorice Flavor [Flavoring Agent (Non-Screening)] Palpitations    Patient Measurements: Height: 5\' 9"  (175.3 cm) Weight: 54.4 kg (120 lb) IBW/kg (Calculated) : 66.2 HEPARIN DW (KG): 54.4  Vital Signs: Temp: 98.1 F (36.7 C) (05/07 1521) Temp Source: Oral (05/07 1521) BP: 111/65 (05/07 1430) Pulse Rate: 89 (05/07 1430)  Labs: Recent Labs    09/02/23 1149 09/02/23 1358 09/02/23 1828 09/02/23 2234 09/03/23 0520 09/03/23 0943  HGB 9.7*  --  11.1*  --  10.0*  --   HCT 29.9*  --  33.1*  --  29.6*  --   PLT 212  --  194  --  177  --   LABPROT 16.1*  --   --   --   --   --   INR 1.3*  --   --   --   --   --   CREATININE 0.83  --   --   --  0.73  --   TROPONINIHS 56*   < > 2,130* 2,674*  --  1,781*   < > = values in this interval not displayed.    Estimated Creatinine Clearance: 40.9 mL/min (by C-G formula based on SCr of 0.73 mg/dL).   Medical History: Past Medical History:  Diagnosis Date   Diverticulitis    Glaucoma    High blood pressure    Osteoarthritis    knees   Osteoporosis 10/05/2015   Vitamin D  deficiency 09/18/2015   Assessment: 89 y/o female presenting with acute blood loss from varicose vein on right ankle s/p ligation in OR on 09/02/23. PMH significant for Hypertension, congenital mitral valve prolapse, hyperlipidemia, aortic valve sclerosis, osteoporosis. Found to have elevated troponin levels on admission. Pharmacy consulted to initiate heparin infusion. Per chart review, patient is not on  anticoagulation prior to admission.  Baseline labs: hgb 10, plt 177, INR pending  Goal of Therapy:  Heparin level 0.3-0.7 units/ml Monitor platelets by anticoagulation protocol: Yes   Plan:  Give small heparin bolus of 1600 units x1 given recent acute blood loss and recent procedure Start heparin infusion at 650 units/hr Check anti-Xa level in 8 hours and daily while on heparin Continue to monitor H&H and platelets  Thank you for involving pharmacy in this patient's care.   Ananias Balls, PharmD Clinical Pharmacist 09/03/2023 4:17 PM

## 2023-09-03 NOTE — ED Notes (Signed)
 Pt resting comfortably in bed at this time. Pt ABCs intact. RR even and unlabored. Pt in NAD. Bed in lowest locked position. Call bell in reach. Denies needs at this time.

## 2023-09-03 NOTE — Progress Notes (Signed)
  PROGRESS NOTE    Anne Ferrell  ZOX:[id number] DOB: 03-Nov-1932 DOA: 09/02/2023 PCP: Scherrie Curt, MD  [id number]  LOS: 0 days   Brief hospital course:   Assessment & Plan: Anne Ferrell is a 89 y.o. female with medical history significant of Hypertension, congenital mitral valve prolapse, hyperlipidemia, aortic valve sclerosis, osteoporosis, who presents to the ED due to unresponsiveness.   Anne Ferrell states that she was in her usual state of health earlier today when she noticed a scab on her right ankle.  She picked the scab and noted sudden onset severe bleeding from the spot.  She soaked approximately 2 towels full of blood.  Then she began to feel faint with shortness of breath and lost consciousness.  Prior to doing so, she was on the phone with her daughter and so EMS was called.   Per chart review, on EMS arrival, patient was lying in a large pool of blood approximated to be at least 2 L, agonal breathing.  Pulses were unable to be palpated.  She was bagged several times and then pulses returned without initiation of CPR.   * Hemorrhagic shock (HCC) Patient is presenting with hemorrhagic shock in the setting of a ruptured varicose vein with estimated 2 L of blood loss by EMS.   --s/p 1u pRBC --monitor Hgb --hold home BP meds --cont MIVF  Orthostasis 2/2 recent severe bleeding and volume depletion --cont MIVF --hold home BP meds  NSTEMI - type I vs type II  Initial troponin of 56, which trended up to 2000's. --cardio consult today --start heparin gtt, per cardio --plan for LHC  Varicose veins of right lower extremity with ulcer of ankle limited to breakdown of skin [facility]) Vascular surgery consulted due to severe bleed from lower extremity with their examination concerning for ruptured varicose vein.   --s/p ligation and sclerotherapy of bleeding varicosity on 5/6 --follow up with vascular on Monday next week in clinic for unna boot removal.   Essential  hypertension - hold home dilt and lasix  Systolic CHF --unclear chronicity  --current Echo showed EF 35-40%, LV RWMA. Concern for stress cardiomyopathy, vs. ischemia.  --management per cardio   DVT prophylaxis: [id number] gtt Code Status: Full code  Family Communication:  Level of care: Progressive Dispo:   The patient is from: home Anticipated d/c is to: home Anticipated d/c date is: 1-2 days   Subjective and Interval History:  Pt had severe orthostasis today when up.   Objective: Vitals:   09/03/23 1104 09/03/23 1410 09/03/23 1430 09/03/23 1521  BP: 134/77 134/77 111/65   Pulse: 80  89   Resp: 15  18   Temp:    98.1 F (36.7 C)  TempSrc:    Oral  SpO2: 100%  96%   Weight:      Height:       No intake or output data in the 24 hours ending 09/03/23 1846 Filed Weights   09/02/23 1145  Weight: 54.4 kg    Examination:   Constitutional: NAD, AAOx3 HEENT: conjunctivae and lids normal, EOMI CV: No cyanosis.   RESP: normal respiratory effort, on RA Extremities: right lower leg wrapped Neuro: II - XII grossly intact.   Psych: Normal mood and affect.  Appropriate judgement and reason   Data Reviewed: I have personally reviewed labs and imaging studies  Time spent: 50 minutes  Garrison Kanner, MD [facility] If 7PM-7AM, please contact night-coverage 09/03/2023, 6:46 PM

## 2023-09-03 NOTE — ED Notes (Signed)
 Pt got up and utilized bedside commode. Pt states that she kept all weight off of her R foot to avoid any bleeding from the site.

## 2023-09-03 NOTE — Consult Note (Addendum)
 [facility] CLINIC CARDIOLOGY CONSULT NOTE       Patient ID: Anne Ferrell MRN: [id number] DOB/AGE: 88/21/1934 88 y.o.  Admit date: 09/02/2023 Referring Physician Dr. Garrison Kanner Primary Physician Copland, Jolena Nay, MD  Primary Cardiologist Dr. Beau Bound Reason for Consultation NSTEMI  HPI: Anne Ferrell is a 89 y.o. female  with a past medical history of hypertension, tachycardia, aortic sclerosis, congenital mitral valve prolapse, amaurosis fugax who presented to the ED on 09/02/2023 for unresponsiveness, significantly bleeding.  Was at home and had picked a scab on her right ankle, this bled significantly and she was found down by EMS - pulseless and agonal breathing. Brought to the ED and taken to the OR for ligation. Troponins found to be elevated. Cardiology was consulted for further evaluation.   Patient reported to ED via EMS as patient was found in large pool of blood from picked scab on right ankle.  States that after scratching scab off on her ankle she started having profuse bleeding and soaked through 2 towels.  She was on the phone with her daughter at that time and presumably passed out and her daughter called 911.  Workup in the ED notable for creatinine 0.83, potassium 3.8, sodium 137, WBC 7.9, hemoglobin 9.7.  Received 2 unit PRBCs in the ED.  Troponins trended 56 > 1133 > 2130 > 2674 > 1781. Initial EKG in ED concern for ST elevation in inferior and anterior leads then 4 hours later the ST elevation became less prominent on repeat EKG. Yesterday evening in the ED she had recurrence of bleeding, thus taken to the OR for ligation.  At the time of my evaluation this afternoon patient was resting comfortably in ED stretcher in no acute distress. Discussed patients symptoms in further detail. Patient denies any chest pain/pressure, SOB or other cardiac symptoms prior to ED arrival or throughout admission. Per tele patient in sinus rhythm with rates 90s. BP improved this afternoon. Hgb this  AM 10.0.   Review of systems complete and found to be negative unless listed above    Past Medical History:  Diagnosis Date   Diverticulitis    Glaucoma    High blood pressure    Osteoarthritis    knees   Osteoporosis 10/05/2015   Vitamin D  deficiency 09/18/2015    Past Surgical History:  Procedure Laterality Date   ABDOMINAL HYSTERECTOMY  2008   CATARACT EXTRACTION W/PHACO Right 02/27/2022   Procedure: Annell Kidney DUAL BLADE GONIOTOMY RIGHT;  Surgeon: Annell Kidney, MD;  Location: [facility];  Service: Ophthalmology;  Laterality: Right;   cataract surgery Bilateral 2011   CLOSED REDUCTION WRIST FRACTURE Right 12/23/2020   Procedure: CLOSED REDUCTION AND PERCUTANEOUS PINNING RIGHT WRIST;  Surgeon: Marlynn Singer, MD;  Location: [facility] ORS;  Service: Orthopedics;  Laterality: Right;   HIP ARTHROPLASTY Right 12/23/2020   Procedure: ARTHROPLASTY BIPOLAR HIP (HEMIARTHROPLASTY);  Surgeon: Marlynn Singer, MD;  Location: [facility] ORS;  Service: Orthopedics;  Laterality: Right;   ORIF ELBOW FRACTURE Right 12/23/2020   Procedure: OPEN REDUCTION INTERNAL FIXATION (ORIF) ELBOW/OLECRANON FRACTURE;  Surgeon: Marlynn Singer, MD;  Location: [facility] ORS;  Service: Orthopedics;  Laterality: Right;   TONSILLECTOMY AND ADENOIDECTOMY  1941    (Not in a hospital admission)  Social History   Socioeconomic History   Marital status: Widowed    Spouse name: Not on file   Number of children: Not on file   Years of education: Not on file   Highest education level: Not on file  Occupational History  Not on file  Tobacco Use   Smoking status: Never   Smokeless tobacco: Never  Vaping Use   Vaping status: Never Used  Substance and Sexual Activity   Alcohol use: Yes    Alcohol/week: 7.0 standard drinks of alcohol    Types: 7 Glasses of wine per week   Drug use: No   Sexual activity: Not Currently  Other Topics Concern   Not on file  Social History Narrative   Mother of Anne Ferrell   Social  Drivers of Health   Financial Resource Strain: Low Risk  (11/11/2022)   Overall Financial Resource Strain (CARDIA)    Difficulty of Paying Living Expenses: Not hard at all  Food Insecurity: No Food Insecurity (09/03/2023)   Hunger Vital Sign    Worried About Running Out of Food in the Last Year: Never true    Ran Out of Food in the Last Year: Never true  Transportation Needs: No Transportation Needs (09/03/2023)   PRAPARE - Administrator, Civil Service (Medical): No    Lack of Transportation (Non-Medical): No  Physical Activity: Insufficiently Active (11/11/2022)   Exercise Vital Sign    Days of Exercise per Week: 7 days    Minutes of Exercise per Session: 20 min  Stress: No Stress Concern Present (11/11/2022)   [employer] of Occupational Health - Occupational Stress Questionnaire    Feeling of Stress : Not at all  Social Connections: Socially Isolated (09/03/2023)   Social Connection and Isolation Panel [NHANES]    Frequency of Communication with Friends and Family: Three times a week    Frequency of Social Gatherings with Friends and Family: Three times a week    Attends Religious Services: Never    Active Member of Clubs or Organizations: No    Attends Banker Meetings: Never    Marital Status: Widowed  Intimate Partner Violence: Not At Risk (09/03/2023)   Humiliation, Afraid, Rape, and Kick questionnaire    Fear of Current or Ex-Partner: No    Emotionally Abused: No    Physically Abused: No    Sexually Abused: No    Family History  Problem Relation Age of Onset   Cancer Father        prostate   Colon cancer Neg Hx      Vitals:   09/03/23 1104 09/03/23 1410 09/03/23 1430 09/03/23 1521  BP: 134/77 134/77 111/65   Pulse: 80  89   Resp: 15  18   Temp:    98.1 F (36.7 C)  TempSrc:    Oral  SpO2: 100%  96%   Weight:      Height:        PHYSICAL EXAM General: well appearing elderly female, well nourished, in no acute distress. HEENT:  Normocephalic and atraumatic. Neck: No JVD.  Lungs: Normal respiratory effort on room air. Clear bilaterally to auscultation. No wheezes, crackles, rhonchi.  Heart: HRRR. Normal S1 and S2 without gallops or murmurs.  Abdomen: Non-distended appearing.  Msk: Normal strength and tone for age. Extremities: Warm and well perfused. No clubbing, cyanosis. No edema.  Neuro: Alert and oriented X 3. Psych: Answers questions appropriately.   Labs: Basic Metabolic Panel: Recent Labs    09/02/23 1149 09/03/23 0520  NA 137 137  K 3.8 3.9  CL 108 107  CO2 19* 22  GLUCOSE 239* 115*  BUN 23 19  CREATININE 0.83 0.73  CALCIUM  7.9* 8.1*   Liver Function Tests: Recent Labs  09/02/23 1149  AST 29  ALT 20  ALKPHOS 50  BILITOT 0.8  PROT 4.3*  ALBUMIN 2.9*   No results for input(s): "LIPASE", "AMYLASE" in the last 72 hours. CBC: Recent Labs    09/02/23 1828 09/03/23 0520  WBC 14.4* 9.8  HGB 11.1* 10.0*  HCT 33.1* 29.6*  MCV 92.5 93.4  PLT 194 177   Cardiac Enzymes: Recent Labs    09/02/23 1828 09/02/23 2234 09/03/23 0943  TROPONINIHS 2,130* 2,674* 1,781*   BNP: No results for input(s): "BNP" in the last 72 hours. D-Dimer: No results for input(s): "DDIMER" in the last 72 hours. Hemoglobin A1C: No results for input(s): "HGBA1C" in the last 72 hours. Fasting Lipid Panel: No results for input(s): "CHOL", "HDL", "LDLCALC", "TRIG", "CHOLHDL", "LDLDIRECT" in the last 72 hours. Thyroid  Function Tests: No results for input(s): "TSH", "T4TOTAL", "T3FREE", "THYROIDAB" in the last 72 hours.  Invalid input(s): "FREET3" Anemia Panel: No results for input(s): "VITAMINB12", "FOLATE", "FERRITIN", "TIBC", "IRON", "RETICCTPCT" in the last 72 hours.   Radiology: ECHOCARDIOGRAM LIMITED Result Date: 09/03/2023    ECHOCARDIOGRAM LIMITED REPORT   Patient Name:   Anne Ferrell Date of Exam: 09/03/2023 Medical Rec #:  [id number]         Height:       69.0 in Accession #:    [id number]         Weight:       120.0 lb Date of Birth:  16-Dec-1932          BSA:          1.663 m Patient Age:    89 years          BP:           134/77 mmHg Patient Gender: F                 HR:           80 bpm. Exam Location:  [facility] Procedure: Limited Echo, Color Doppler, Cardiac Doppler and Intracardiac            Opacification Agent (Both Spectral and Color Flow Doppler were            utilized during procedure). Indications:     Shock R57.9  History:         Patient has prior history of Echocardiogram examinations, most                  recent 09/16/2017. High blodd pressure.  Sonographer:     Anne Ferrell Referring Phys:  [id number] Anne Ferrell Diagnosing Phys: Anne Ferrell IMPRESSIONS  1. This is a limited echo.  2. Left ventricular ejection fraction, by estimation, is 35 to 40%. The left ventricle has moderately decreased function. The left ventricle demonstrates regional wall motion abnormalities (see scoring diagram/findings for description).  3. Right ventricular systolic function is normal. The right ventricular size is normal.  4. Moderate mitral valve regurgitation.  5. Systolic anterior motion of mitral leaflet with severe LVOT obstruction (peak gradient 220 mmHg). The aortic valve was not well visualized. FINDINGS  Left Ventricle: Left ventricular ejection fraction, by estimation, is 35 to 40%. The left ventricle has moderately decreased function. The left ventricle demonstrates regional wall motion abnormalities. Definity contrast agent was given IV to delineate the left ventricular endocardial borders. The left ventricular internal cavity size was normal in size.  LV Wall Scoring: The entire apex is akinetic. The mid anteroseptal segment, mid inferolateral segment, mid anterolateral segment, mid inferoseptal  segment, mid anterior segment, and mid inferior segment are hypokinetic. The basal anteroseptal segment, basal inferolateral segment, basal anterolateral segment, basal anterior segment, basal inferior segment,  and basal inferoseptal segment are normal. Right Ventricle: The right ventricular size is normal. No increase in right ventricular wall thickness. Right ventricular systolic function is normal. Mitral Valve: There is mild thickening of the mitral valve leaflet(s). Moderate mitral valve regurgitation. Tricuspid Valve: The tricuspid valve is not well visualized. Tricuspid valve regurgitation is trivial. Aortic Valve: Systolic anterior motion of mitral leaflet with severe LVOT obstruction (peak gradient 220 mmHg). The aortic valve was not well visualized. Aortic valve mean gradient measures 39.3 mmHg. Aortic valve peak gradient measures 97.7 mmHg. Aortic  valve area, by VTI measures 5.88 cm. Additional Comments: Spectral Doppler performed. Color Doppler performed.  LEFT VENTRICLE PLAX 2D LVIDd:         4.30 cm LVIDs:         2.40 cm LV PW:         0.80 cm LV IVS:        1.00 cm LVOT diam:     2.00 cm LV SV:         380 LV SV Index:   229 LVOT Area:     3.14 cm  LV Volumes (MOD) LV vol d, MOD A2C: 63.4 ml LV vol d, MOD A4C: 96.7 ml LV vol s, MOD A2C: 50.0 ml LV vol s, MOD A4C: 63.4 ml LV SV MOD A2C:     13.4 ml LV SV MOD A4C:     96.7 ml LV SV MOD BP:      23.1 ml AORTIC VALVE AV Area (Vmax):    4.73 cm AV Area (Vmean):   6.10 cm AV Area (VTI):     5.88 cm AV Vmax:           494.33 cm/s AV Vmean:          263.667 cm/s AV VTI:            0.646 m AV Peak Grad:      97.7 mmHg AV Mean Grad:      39.3 mmHg LVOT Vmax:         744.00 cm/s LVOT Vmean:        512.000 cm/s LVOT VTI:          1.210 m LVOT/AV VTI ratio: 1.87 TRICUSPID VALVE TR Peak grad:   21.7 mmHg TR Vmax:        233.00 cm/s  SHUNTS Systemic VTI:  1.21 m Systemic Diam: 2.00 cm Anne Ferrell Electronically signed by Anne Ferrell Signature Date/Time: 09/03/2023/3:08:38 PM    Final     ECHO ordered  TELEMETRY reviewed by me 09/03/2023: sinus rhythm, rates 90s  EKG reviewed by me: sinus rhythm with ST elevation inferior and anterior leads, rate 74  bpm  Data reviewed by me 09/03/2023: last 24h vitals tele labs imaging I/O ED provider note, admission H&P, vascular surgery notes  Principal Problem:   Hemorrhagic shock (HCC) Active Problems:   Essential hypertension   Varicose veins of right lower extremity with ulcer of ankle limited to breakdown of skin (HCC)   Hemorrhage   Demand ischemia (HCC)   Blood loss anemia    ASSESSMENT AND PLAN:  Anne Ferrell is a 89 y.o. female  with a past medical history of hypertension, tachycardia, aortic sclerosis, congenital mitral valve prolapse, amaurosis fugax who presented to the ED on 09/02/2023 for unresponsiveness, significantly bleeding.  Was at home and had picked a scab on her right ankle, this bled significantly and she was found down by EMS. Brought to the ED and taken to the OR for ligation. Troponins found to be elevated. Cardiology was consulted for further evaluation.   # Hemorrhagic shock # Right LE ulcer, varicose veins # NSTEMI - type I vs type II # Hypertension Patient reported to ED with bleeding on right ankle that required ligation. Patient without chest pain/pressure or SOB. Troponins elevated and trended 56 > 1133 > 2130 > 2674 > 1781.  Initial EKG in ED concern for ST elevation in inferior and anterior leads then 4 hours later the ST elevation became less prominent on repeat EKG. S/p blood transfusion upon admission. Hgb today 10.0. BP improved and HR stable this afternoon. -Echo ordered.  To be done this afternoon -Recommend initiation of heparin infusion and aspirin .  Will confirm with vascular surgery is okay to start at this time.  Awaiting their recommendations. -Recommend LHC for ischemic evaluation awaiting vascular surgery recommendations if patient able to be anticoagulated.  -Hold home PO diltiazem.  -Defer home PO lasix at this time. Patient appears euvolemic on exam.   TIMI Risk Score for Unstable Angina or Non-ST Elevation MI:   The patient's TIMI risk score  is 4, which indicates a 20% risk of all cause mortality, new or recurrent myocardial infarction or need for urgent revascularization in the next 14 days.  ADDENDUM: Echo read by Dr. Bob Ferrell.  EF 35-40% with LV moderately decreased function, LV RWMA. Concern for stress cardiomyopathy, vs. ischemia. Hold antihypertensive medications. Recommend continuing IVFs.   This patient's plan of care was discussed and created with Dr. Bob Ferrell and he is in agreement.  Signed: Creighton Doffing, PA-C  09/03/2023, 3:24 PM [facility] Cardiology

## 2023-09-04 ENCOUNTER — Encounter
Admission: EM | Disposition: A | Discharge status: Home Health | Payer: Self-pay | Source: Home / Self Care | Attending: Hospitalist

## 2023-09-04 ENCOUNTER — Telehealth (HOSPITAL_COMMUNITY): Payer: Self-pay | Admitting: Pharmacy Technician

## 2023-09-04 ENCOUNTER — Other Ambulatory Visit (HOSPITAL_COMMUNITY): Payer: Self-pay

## 2023-09-04 DIAGNOSIS — R578 Other shock: Secondary | ICD-10-CM | POA: Diagnosis not present

## 2023-09-04 LAB — BASIC METABOLIC PANEL WITH GFR
Anion gap: 9 (ref 5–15)
BUN: 16 mg/dL (ref 8–23)
CO2: 22 mmol/L (ref 22–32)
Calcium: 7.8 mg/dL — ABNORMAL LOW (ref 8.9–10.3)
Chloride: 106 mmol/L (ref 98–111)
Creatinine, Ser: 0.56 mg/dL (ref 0.44–1.00)
GFR, Estimated: >60 mL/min (ref >60)
Glucose, Bld: 105 mg/dL — ABNORMAL HIGH (ref 70–99)
Potassium: 3.7 mmol/L (ref 3.5–5.1)
Sodium: 137 mmol/L (ref 135–145)

## 2023-09-04 LAB — TYPE AND SCREEN
ABO/RH(D): O POS
Antibody Screen: NEGATIVE
Unit division: 0
Unit division: 0
Unit division: 0

## 2023-09-04 LAB — HEPARIN LEVEL (UNFRACTIONATED)
Heparin Unfractionated: 0.1 [IU]/mL — ABNORMAL LOW (ref 0.30–0.70)
Heparin Unfractionated: 0.33 [IU]/mL (ref 0.30–0.70)
Heparin Unfractionated: 0.1 [IU]/mL — ABNORMAL LOW (ref 0.30–0.70)

## 2023-09-04 LAB — BPAM RBC
Blood Product Expiration Date: 202506022359
ISSUE DATE / TIME: 202505061215
ISSUE DATE / TIME: 202505071354
ISSUE DATE / TIME: 202506022359
Unit Type and Rh: 202506022359
Unit Type and Rh: 202506022359
Unit Type and Rh: 5100
Unit Type and Rh: 5100

## 2023-09-04 LAB — MAGNESIUM: Magnesium: 1.8 mg/dL (ref 1.7–2.4)

## 2023-09-04 LAB — CBC
HCT: 28.4 % — ABNORMAL LOW (ref 36.0–46.0)
Hemoglobin: 9.4 g/dL — ABNORMAL LOW (ref 12.0–15.0)
MCH: 31.2 pg (ref 26.0–34.0)
MCHC: 33.1 g/dL (ref 30.0–36.0)
MCV: 94.4 fL (ref 80.0–100.0)
Platelets: 147 10*3/uL — ABNORMAL LOW (ref 150–400)
RBC: 3.01 MIL/uL — ABNORMAL LOW (ref 3.87–5.11)
RDW: 13.7 % (ref 11.5–15.5)
WBC: 7.2 10*3/uL (ref 4.0–10.5)
nRBC: 0 % (ref 0.0–0.2)

## 2023-09-04 SURGERY — LEFT HEART CATH AND CORONARY ANGIOGRAPHY
Anesthesia: Moderate Sedation

## 2023-09-04 MED ORDER — LIDOCAINE HCL 1 % IJ SOLN
INTRAMUSCULAR | Status: AC
  Filled 2023-09-04: qty 20

## 2023-09-04 MED ORDER — HEPARIN (PORCINE) IN NACL 1000-0.9 UT/500ML-% IV SOLN
INTRAVENOUS | Status: DC | PRN
  Administered 2023-09-04: 1000 mL

## 2023-09-04 MED ORDER — EZETIMIBE 10 MG PO TABS
10.0000 mg | ORAL_TABLET | Freq: Every day | ORAL | Status: DC
  Administered 2023-09-04 – 2023-09-05 (×2): 10 mg via ORAL
  Filled 2023-09-04 (×2): qty 1

## 2023-09-04 MED ORDER — TRAMADOL HCL 50 MG PO TABS
50.0000 mg | ORAL_TABLET | Freq: Four times a day (QID) | ORAL | Status: DC | PRN
  Administered 2023-09-04 – 2023-09-05 (×2): 50 mg via ORAL
  Filled 2023-09-04 (×2): qty 1

## 2023-09-04 MED ORDER — MIDAZOLAM HCL 2 MG/2ML IJ SOLN
INTRAMUSCULAR | Status: AC
  Filled 2023-09-04: qty 2

## 2023-09-04 MED ORDER — ASPIRIN 81 MG PO CHEW
81.0000 mg | CHEWABLE_TABLET | Freq: Every day | ORAL | Status: DC
  Administered 2023-09-04: 81 mg via ORAL
  Filled 2023-09-04: qty 1

## 2023-09-04 MED ORDER — LIDOCAINE HCL (PF) 1 % IJ SOLN
INTRAMUSCULAR | Status: DC | PRN
  Administered 2023-09-04: 2 mL

## 2023-09-04 MED ORDER — HEPARIN SODIUM (PORCINE) 1000 UNIT/ML IJ SOLN
INTRAMUSCULAR | Status: DC | PRN
  Administered 2023-09-04: 2500 [IU] via INTRAVENOUS

## 2023-09-04 MED ORDER — VERAPAMIL HCL 2.5 MG/ML IV SOLN
INTRAVENOUS | Status: AC
  Filled 2023-09-04: qty 2

## 2023-09-04 MED ORDER — CLOPIDOGREL BISULFATE 75 MG PO TABS
75.0000 mg | ORAL_TABLET | Freq: Every day | ORAL | Status: DC
  Administered 2023-09-05: 75 mg via ORAL
  Filled 2023-09-04: qty 1

## 2023-09-04 MED ORDER — HEPARIN SODIUM (PORCINE) 1000 UNIT/ML IJ SOLN
INTRAMUSCULAR | Status: AC
  Filled 2023-09-04: qty 10

## 2023-09-04 MED ORDER — HEPARIN (PORCINE) IN NACL 1000-0.9 UT/500ML-% IV SOLN
INTRAVENOUS | Status: AC
  Filled 2023-09-04: qty 500

## 2023-09-04 MED ORDER — VERAPAMIL HCL 2.5 MG/ML IV SOLN
INTRAVENOUS | Status: DC | PRN
  Administered 2023-09-04: 2.5 mL via INTRA_ARTERIAL

## 2023-09-04 MED ORDER — SODIUM CHLORIDE 0.9 % WEIGHT BASED INFUSION: 1.0000 mL/kg/h | INTRAVENOUS | Status: AC

## 2023-09-04 MED ORDER — IOHEXOL 300 MG/ML  SOLN
INTRAMUSCULAR | Status: DC | PRN
  Administered 2023-09-04: 102 mL

## 2023-09-04 MED ORDER — HEPARIN BOLUS VIA INFUSION
1600.0000 [IU] | Freq: Once | INTRAVENOUS | Status: AC
  Administered 2023-09-04: 1600 [IU] via INTRAVENOUS
  Filled 2023-09-04: qty 1600

## 2023-09-04 MED ORDER — MIDAZOLAM HCL 2 MG/2ML IJ SOLN
INTRAMUSCULAR | Status: DC | PRN
  Administered 2023-09-04: .5 mg via INTRAVENOUS

## 2023-09-04 SURGICAL SUPPLY — 10 items
CATH INFINITI 5 FR JL3.5 (CATHETERS) IMPLANT
CATH INFINITI JR4 5F (CATHETERS) IMPLANT
DEVICE RAD TR BAND REGULAR (VASCULAR PRODUCTS) IMPLANT
DRAPE BRACHIAL (DRAPES) IMPLANT
GLIDESHEATH SLEND SS 6F .021 (SHEATH) IMPLANT
GUIDEWIRE INQWIRE 1.5J.035X260 (WIRE) IMPLANT
PACK CARDIAC CATH (CUSTOM PROCEDURE TRAY) ×1 IMPLANT
SET ATX-X65L (MISCELLANEOUS) IMPLANT
SHEATH 6FR 85 DEST SLENDER (SHEATH) IMPLANT
STATION PROTECTION PRESSURIZED (MISCELLANEOUS) IMPLANT

## 2023-09-04 NOTE — Progress Notes (Signed)
 PHARMACY - ANTICOAGULATION CONSULT NOTE  Pharmacy Consult for heparin Indication: chest pain/ACS  Allergies  Allergen Reactions   Codeine     Double Vision   Percocet [Oxycodone -Acetaminophen ]     Occular Migraine   Timolol Other (See Comments)    Pt states her heart stopped.    Anise Flavoring Agent (Non-Screening) Palpitations and Other (See Comments)    Feels like she is going to pass out   Ciprofloxacin Palpitations   Levofloxacin Palpitations   Licorice Flavor [Flavoring Agent (Non-Screening)] Palpitations    Patient Measurements: Height: 5\' 9"  (175.3 cm) Weight: 54.4 kg (120 lb) IBW/kg (Calculated) : 66.2 HEPARIN DW (KG): 54.4  Vital Signs: Temp: 97.7 F (36.5 C) (05/08 0735) Temp Source: Oral (05/08 0735) BP: 134/79 (05/08 0735) Pulse Rate: 91 (05/08 0735)  Labs: Recent Labs    09/02/23 1149 09/02/23 1358 09/02/23 1828 09/02/23 2234 09/03/23 0520 09/03/23 0943 09/03/23 1651 09/04/23 0100 09/04/23 0550  HGB 9.7*  --  11.1*  --  10.0*  --   --  9.4*  --   HCT 29.9*  --  33.1*  --  29.6*  --   --  28.4*  --   PLT 212  --  194  --  177  --   --  147*  --   LABPROT 16.1*  --   --   --   --   --  14.9  --   --   INR 1.3*  --   --   --   --   --  1.1  --   --   HEPARINUNFRC  --   --   --   --   --   --   --  0.10*  --   CREATININE 0.83  --   --   --  0.73  --   --   --  0.56  TROPONINIHS 56*   < > 2,130* 2,674*  --  1,781*  --   --   --    < > = values in this interval not displayed.    Estimated Creatinine Clearance: 40.9 mL/min (by C-G formula based on SCr of 0.56 mg/dL).   Medical History: Past Medical History:  Diagnosis Date   Diverticulitis    Glaucoma    High blood pressure    Osteoarthritis    knees   Osteoporosis 10/05/2015   Vitamin D  deficiency 09/18/2015   Assessment: 89 y/o female presenting with acute blood loss from varicose vein on right ankle s/p ligation in OR on 09/02/23. PMH significant for hypertension, congenital mitral valve  prolapse, hyperlipidemia, aortic valve sclerosis, osteoporosis. Presented with no chest pain, although EKG showed ST segment elevation noted in inferolateral distribution consistent with acute ischemia, troponins found trending up 56 >> 1133 >> 2130 >> 2674 >> 1781. Plan for LHC. Pharmacy consulted to initiate heparin infusion. Baseline labs: hgb 10, plt 177, INR 1.1.  PTA medications: no anticoagulation therapy  5/8 CBC: Hbg 11.1 (baseline 13), platelets 194 (baseline)  0508 0100 HL 0.10 SUBtherapeutic 0508 0942 HL 0.33 Therapeutic x 1  Goal of Therapy:  Heparin level 0.3-0.7 units/ml Monitor platelets by anticoagulation protocol: Yes   Plan:  - Continue heparin 850 units/hr - Will recheck HL 8 hrs to confirm (@1800 ) - CBC daily while on heparin  Thank you for involving pharmacy in this patient's care.   Otha Blight, PharmD Candidate 09/04/2023 9:07 AM

## 2023-09-04 NOTE — Progress Notes (Addendum)
 PROGRESS NOTE    Anne Ferrell  WUJ:[id number] DOB: [birth year] DOA: 09/02/2023 PCP: Anne Curt, MD  [id number]/[id number]-AA  LOS: 1 day   Brief hospital course:   Assessment & Plan: Anne Ferrell is a 89 y.o. female with medical history significant of Hypertension, congenital mitral valve prolapse, hyperlipidemia, aortic valve sclerosis, osteoporosis, who presents to the ED due to unresponsiveness.   Anne Ferrell states that she was in her usual state of health earlier today when she noticed a scab on her right ankle.  She picked the scab and noted sudden onset severe bleeding from the spot.  She soaked approximately 2 towels full of blood.  Then she began to feel faint with shortness of breath and lost consciousness.  Prior to doing so, she was on the phone with her daughter and so EMS was called.   Per chart review, on EMS arrival, patient was lying in a large pool of blood approximated to be at least 2 L, agonal breathing.  Pulses were unable to be palpated.  She was bagged several times and then pulses returned without initiation of CPR.   * Hemorrhagic shock (HCC) Acute blood loss anemia  Patient is presenting with hemorrhagic shock in the setting of a ruptured varicose vein with estimated 2 L of blood loss by EMS.   --s/p 1u pRBC --monitor Hgb --monitor Hgb --hold home BP meds  Orthostasis 2/2 recent severe bleeding and volume depletion --s/p MIVF --hold home BP meds --repeat orthostatic BP measurement tomorrow  NSTEMI - type I vs type II  Initial troponin of 56, which trended up to 2000's. --cardio consulted --cont heparin  gtt --LHC today  Varicose veins of right lower extremity with ulcer of ankle limited to breakdown of skin [facility]) Vascular surgery consulted due to severe bleed from lower extremity with their examination concerning for ruptured varicose vein.   --s/p ligation and sclerotherapy of bleeding varicosity on 5/6 --follow up with vascular on Monday next  week in clinic for unna boot removal.   Essential hypertension - hold home dilt and lasix  Systolic CHF --unclear chronicity  --current Echo showed EF 35-40%, LV RWMA. Concern for stress cardiomyopathy, vs. ischemia.  --management per cardio   DVT prophylaxis: On:heparin  gtt Code Status: Full code  Family Communication:  Level of care: Progressive Dispo:   The patient is from: home Anticipated d/c is to: home Anticipated d/c date is: 1-2 days   Subjective and Interval History:  Pt reported feeling better, not dizzy anymore.  Reported leg pain.   Objective: Vitals:   09/04/23 1845 09/04/23 1900 09/04/23 1915 09/04/23 1930  BP: (!) 142/77 132/66 118/60 (!) 120/57  Pulse: (!) 104 (!) 105 93 91  Resp: 18 19 17 18   Temp:      TempSrc:      SpO2: 96% 97% 96% 96%  Weight:      Height:       No intake or output data in the 24 hours ending 09/04/23 2006 Filed Weights   09/02/23 1145  Weight: 54.4 kg    Examination:   Constitutional: NAD, AAOx3 HEENT: conjunctivae and lids normal, EOMI CV: No cyanosis.   RESP: normal respiratory effort, on RA Extremities: RLE wrapped SKIN: warm, dry Neuro: II - XII grossly intact.   Psych: Normal mood and affect.  Appropriate judgement and reason   Data Reviewed: I have personally reviewed labs and imaging studies  Time spent: 35 minutes  Garrison Kanner, MD [facility] If 7PM-7AM, please contact night-coverage 09/04/2023,  8:06 PM

## 2023-09-04 NOTE — CV Procedure (Signed)
 Brief post cath note Inpatient cardiac cath left heart right radial approach because of non-STEMI  Left ventriculogram showed depressed left ventricular function with anterior apical hypokinesis Takotsubo pattern EF around 35%  Coronaries Left main large free of disease LAD large with a 50% mid lesion hazy TIMI-3 flow Circumflex large free of disease RCA large 25% mid lesion Mixed dominant system  Intervention deferred Recommend continued aggressive medical therapy non-STEMI Aspirin  Plavix statin patient has been intolerant to beta-blockers in the past.  Will consider ACE or ARB  Anticipate discharge within 23 hours  Full cath note to follow Burney Carter MD

## 2023-09-04 NOTE — Telephone Encounter (Signed)
 Patient Product/process development scientist completed.    The patient is insured through [employer]. Patient has Medicare and is not eligible for a copay card, but may be able to apply for patient assistance or Medicare RX Payment Plan (Patient Must reach out to their plan, if eligible for payment plan), if available.    Ran test claim for Farxiga 10 mg and the current 30 day co-pay is $556.24 due to a $590.00 deductible.  Ran test claim for Jardiance 10 mg and the current 30 day co-pay is $558.05 due to a $590.00 deductible.  Ran test claim for Repatha Sureclick 140 mg/ml Soaj and Requires Prior Authorization  This test claim was processed through [employer]- copay amounts may vary at other pharmacies due to [employer], or as the patient moves through the different stages of their insurance plan.     Morgan Arab, CPHT Pharmacy Technician III Certified Patient Advocate [facility] Patient Advocate Team Direct Number: [phone]  Fax: [phone]

## 2023-09-04 NOTE — Plan of Care (Signed)

## 2023-09-04 NOTE — Progress Notes (Addendum)
 [facility] CLINIC CARDIOLOGY PROGRESS NOTE       Patient ID: Anne Ferrell MRN: [id number] DOB/AGE: [birth year] 89 y.o.  Admit date: 09/02/2023 Referring Physician Dr. Garrison Kanner Primary Physician Copland, Jolena Nay, MD  Primary Cardiologist Dr. Beau Bound Reason for Consultation NSTEMI  HPI: Anne Ferrell is a 89 y.o. female  with a past medical history of hypertension, tachycardia, aortic sclerosis, congenital mitral valve prolapse, amaurosis fugax who presented to the ED on 09/02/2023 for unresponsiveness, significantly bleeding.  Was at home and had picked a scab on her right ankle, this bled significantly and she was found down by EMS - pulseless and agonal breathing. Brought to the ED and taken to the OR for ligation. Troponins found to be elevated. Cardiology was consulted for further evaluation.   Interval History: -Patient seen and examined this afternoon and laying comfortably in hospital bed. Patient states she's feeling well overall without any chest pain or SOB. -Patients BP and HR  are stable this afternoon. Overnight Tele showed no significant events.  -Hgb stable at 9.4. Cr stable at 0.56 -LHC planned for today at 1530 with Dr. Beau Bound and patient is agreeable.  -Patient has been NPO.   Review of systems complete and found to be negative unless listed above    Past Medical History:  Diagnosis Date   Diverticulitis    Glaucoma    High blood pressure    Osteoarthritis    knees   Osteoporosis 10/05/2015   Vitamin D  deficiency 09/18/2015    Past Surgical History:  Procedure Laterality Date   ABDOMINAL HYSTERECTOMY  2008   CATARACT EXTRACTION W/PHACO Right 02/27/2022   Procedure: Annell Kidney DUAL BLADE GONIOTOMY RIGHT;  Surgeon: Annell Kidney, MD;  Location: [facility];  Service: Ophthalmology;  Laterality: Right;   cataract surgery Bilateral 2011   CLOSED REDUCTION WRIST FRACTURE Right 12/23/2020   Procedure: CLOSED REDUCTION AND PERCUTANEOUS PINNING RIGHT  WRIST;  Surgeon: Marlynn Singer, MD;  Location: [facility] ORS;  Service: Orthopedics;  Laterality: Right;   HIP ARTHROPLASTY Right 12/23/2020   Procedure: ARTHROPLASTY BIPOLAR HIP (HEMIARTHROPLASTY);  Surgeon: Marlynn Singer, MD;  Location: [facility] ORS;  Service: Orthopedics;  Laterality: Right;   ORIF ELBOW FRACTURE Right 12/23/2020   Procedure: OPEN REDUCTION INTERNAL FIXATION (ORIF) ELBOW/OLECRANON FRACTURE;  Surgeon: Marlynn Singer, MD;  Location: [facility] ORS;  Service: Orthopedics;  Laterality: Right;   TONSILLECTOMY AND ADENOIDECTOMY  1941    Medications Prior to Admission  Medication Sig Dispense Refill Last Dose/Taking   diltiazem (CARDIZEM) 120 MG tablet Take 60 mg by mouth in the morning and at bedtime.   09/02/2023 Morning   furosemide (LASIX) 20 MG tablet Take 20 mg by mouth daily.   09/01/2023   Multiple Vitamins-Minerals (PRESERVISION AREDS) CAPS Take 1 capsule by mouth daily.   09/01/2023   vitamin B-12 (CYANOCOBALAMIN ) 1000 MCG tablet Take 1,000 mcg by mouth daily.   Past Week   Vitamin D , Cholecalciferol , 10 MCG (400 UNIT) TABS Take 400 mg by mouth in the morning and at bedtime.   09/01/2023 Morning   Magnesium  250 MG TABS Take 2 tablets by mouth daily. (Patient not taking: Reported on 09/02/2023)   Not Taking   VITAMIN A  PO Take 1 tablet by mouth daily. 10,000 units      Social History   Socioeconomic History   Marital status: Widowed    Spouse name: Not on file   Number of children: Not on file   Years of education: Not on file   Highest education level:  Not on file  Occupational History   Not on file  Tobacco Use   Smoking status: Never   Smokeless tobacco: Never  Vaping Use   Vaping status: Never Used  Substance and Sexual Activity   Alcohol use: Yes    Alcohol/week: 7.0 standard drinks of alcohol    Types: 7 Glasses of wine per week   Drug use: No   Sexual activity: Not Currently  Other Topics Concern   Not on file  Social History Narrative   Mother of Honore Lux   Social  Drivers of Health   Financial Resource Strain: Low Risk  (11/11/2022)   Overall Financial Resource Strain (CARDIA)    Difficulty of Paying Living Expenses: Not hard at all  Food Insecurity: No Food Insecurity (09/03/2023)   Hunger Vital Sign    Worried About Running Out of Food in the Last Year: Never true    Ran Out of Food in the Last Year: Never true  Transportation Needs: No Transportation Needs (09/03/2023)   PRAPARE - Administrator, Civil Service (Medical): No    Lack of Transportation (Non-Medical): No  Physical Activity: Insufficiently Active (11/11/2022)   Exercise Vital Sign    Days of Exercise per Week: 7 days    Minutes of Exercise per Session: 20 min  Stress: No Stress Concern Present (11/11/2022)   [employer] of Occupational Health - Occupational Stress Questionnaire    Feeling of Stress : Not at all  Social Connections: Socially Isolated (09/03/2023)   Social Connection and Isolation Panel [NHANES]    Frequency of Communication with Friends and Family: Three times a week    Frequency of Social Gatherings with Friends and Family: Three times a week    Attends Religious Services: Never    Active Member of Clubs or Organizations: No    Attends Banker Meetings: Never    Marital Status: Widowed  Intimate Partner Violence: Not At Risk (09/03/2023)   Humiliation, Afraid, Rape, and Kick questionnaire    Fear of Current or Ex-Partner: No    Emotionally Abused: No    Physically Abused: No    Sexually Abused: No    Family History  Problem Relation Age of Onset   Cancer Father        prostate   Colon cancer Neg Hx      Vitals:   09/03/23 2338 09/04/23 0332 09/04/23 0735 09/04/23 1343  BP: (!) 145/79 97/66 134/79 123/66  Pulse: 92 81 91 76  Resp: 18 18 16 16   Temp: 97.9 F (36.6 C) 98.3 F (36.8 C) 97.7 F (36.5 C) 98.2 F (36.8 C)  TempSrc: Oral Oral Oral   SpO2: 100% 98% 94% 99%  Weight:      Height:        PHYSICAL EXAM General:  well appearing elderly female, well nourished, in no acute distress. HEENT: Normocephalic and atraumatic. Neck: No JVD.  Lungs: Normal respiratory effort on room air. Clear bilaterally to auscultation. No wheezes, crackles, rhonchi.  Heart: HRRR. Normal S1 and S2 without gallops or murmurs.  Abdomen: Non-distended appearing.  Msk: Normal strength and tone for age. Extremities: Warm and well perfused. No clubbing, cyanosis. No edema.  Neuro: Alert and oriented X 3. Psych: Answers questions appropriately.   Labs: Basic Metabolic Panel: Recent Labs    09/03/23 0520 09/04/23 0550  NA 137 137  K 3.9 3.7  CL 107 106  CO2 22 22  GLUCOSE 115* 105*  BUN 19  16  CREATININE 0.73 0.56  CALCIUM  8.1* 7.8*  MG  --  1.8   Liver Function Tests: Recent Labs    09/02/23 1149  AST 29  ALT 20  ALKPHOS 50  BILITOT 0.8  PROT 4.3*  ALBUMIN 2.9*   No results for input(s): "LIPASE", "AMYLASE" in the last 72 hours. CBC: Recent Labs    09/03/23 0520 09/04/23 0100  WBC 9.8 7.2  HGB 10.0* 9.4*  HCT 29.6* 28.4*  MCV 93.4 94.4  PLT 177 147*   Cardiac Enzymes: Recent Labs    09/02/23 1828 09/02/23 2234 09/03/23 0943  TROPONINIHS 2,130* 2,674* 1,781*   BNP: No results for input(s): "BNP" in the last 72 hours. D-Dimer: No results for input(s): "DDIMER" in the last 72 hours. Hemoglobin A1C: No results for input(s): "HGBA1C" in the last 72 hours. Fasting Lipid Panel: No results for input(s): "CHOL", "HDL", "LDLCALC", "TRIG", "CHOLHDL", "LDLDIRECT" in the last 72 hours. Thyroid  Function Tests: No results for input(s): "TSH", "T4TOTAL", "T3FREE", "THYROIDAB" in the last 72 hours.  Invalid input(s): "FREET3" Anemia Panel: No results for input(s): "VITAMINB12", "FOLATE", "FERRITIN", "TIBC", "IRON", "RETICCTPCT" in the last 72 hours.   Radiology: ECHOCARDIOGRAM LIMITED Result Date: 09/03/2023    ECHOCARDIOGRAM LIMITED REPORT   Patient Name:   Anne Ferrell Date of Exam: 09/03/2023  Medical Rec #:  [id number]         Height:       69.0 in Accession #:    [id number]        Weight:       120.0 lb Date of Birth:  [birth year]          BSA:          1.663 m Patient Age:    89 years          BP:           134/77 mmHg Patient Gender: F                 HR:           80 bpm. Exam Location:  [facility] Procedure: Limited Echo, Color Doppler, Cardiac Doppler and Intracardiac            Opacification Agent (Both Spectral and Color Flow Doppler were            utilized during procedure). Indications:     Shock R57.9  History:         Patient has prior history of Echocardiogram examinations, most                  recent 09/16/2017. High blodd pressure.  Sonographer:     Broadus Canes Referring Phys:  [id number] Avi Body Diagnosing Phys: Lida Reeks Alluri IMPRESSIONS  1. This is a limited echo.  2. Left ventricular ejection fraction, by estimation, is 35 to 40%. The left ventricle has moderately decreased function. The left ventricle demonstrates regional wall motion abnormalities (see scoring diagram/findings for description).  3. Right ventricular systolic function is normal. The right ventricular size is normal.  4. Moderate mitral valve regurgitation.  5. Systolic anterior motion of mitral leaflet with severe LVOT obstruction (peak gradient 220 mmHg). The aortic valve was not well visualized. FINDINGS  Left Ventricle: Left ventricular ejection fraction, by estimation, is 35 to 40%. The left ventricle has moderately decreased function. The left ventricle demonstrates regional wall motion abnormalities. Definity contrast agent was given IV to delineate the left ventricular endocardial borders. The left ventricular internal cavity size  was normal in size.  LV Wall Scoring: The entire apex is akinetic. The mid anteroseptal segment, mid inferolateral segment, mid anterolateral segment, mid inferoseptal segment, mid anterior segment, and mid inferior segment are hypokinetic. The basal anteroseptal segment, basal  inferolateral segment, basal anterolateral segment, basal anterior segment, basal inferior segment, and basal inferoseptal segment are normal. Right Ventricle: The right ventricular size is normal. No increase in right ventricular wall thickness. Right ventricular systolic function is normal. Mitral Valve: There is mild thickening of the mitral valve leaflet(s). Moderate mitral valve regurgitation. Tricuspid Valve: The tricuspid valve is not well visualized. Tricuspid valve regurgitation is trivial. Aortic Valve: Systolic anterior motion of mitral leaflet with severe LVOT obstruction (peak gradient 220 mmHg). The aortic valve was not well visualized. Aortic valve mean gradient measures 39.3 mmHg. Aortic valve peak gradient measures 97.7 mmHg. Aortic  valve area, by VTI measures 5.88 cm. Additional Comments: Spectral Doppler performed. Color Doppler performed.  LEFT VENTRICLE PLAX 2D LVIDd:         4.30 cm LVIDs:         2.40 cm LV PW:         0.80 cm LV IVS:        1.00 cm LVOT diam:     2.00 cm LV SV:         380 LV SV Index:   229 LVOT Area:     3.14 cm  LV Volumes (MOD) LV vol d, MOD A2C: 63.4 ml LV vol d, MOD A4C: 96.7 ml LV vol s, MOD A2C: 50.0 ml LV vol s, MOD A4C: 63.4 ml LV SV MOD A2C:     13.4 ml LV SV MOD A4C:     96.7 ml LV SV MOD BP:      23.1 ml AORTIC VALVE AV Area (Vmax):    4.73 cm AV Area (Vmean):   6.10 cm AV Area (VTI):     5.88 cm AV Vmax:           494.33 cm/s AV Vmean:          263.667 cm/s AV VTI:            0.646 m AV Peak Grad:      97.7 mmHg AV Mean Grad:      39.3 mmHg LVOT Vmax:         744.00 cm/s LVOT Vmean:        512.000 cm/s LVOT VTI:          1.210 m LVOT/AV VTI ratio: 1.87 TRICUSPID VALVE TR Peak grad:   21.7 mmHg TR Vmax:        233.00 cm/s  SHUNTS Systemic VTI:  1.21 m Systemic Diam: 2.00 cm Joetta Mustache Electronically signed by Joetta Mustache Signature Date/Time: 09/03/2023/3:08:38 PM    Final     ECHO as above  TELEMETRY reviewed by me 09/04/2023: sinus rhythm, rates  70s  EKG reviewed by me: sinus rhythm with ST elevation inferior and anterior leads, rate 74 bpm  Data reviewed by me 09/04/2023: last 24h vitals tele labs imaging I/O ED provider note, admission H&P, vascular surgery notes  Principal Problem:   Hemorrhagic shock (HCC) Active Problems:   Essential hypertension   Varicose veins of right lower extremity with ulcer of ankle limited to breakdown of skin (HCC)   Hemorrhage   Demand ischemia (HCC)   Blood loss anemia    ASSESSMENT AND PLAN:  Anne Ferrell is a 89 y.o. female  with a  past medical history of hypertension, tachycardia, aortic sclerosis, congenital mitral valve prolapse, amaurosis fugax who presented to the ED on 09/02/2023 for unresponsiveness, significantly bleeding.  Was at home and had picked a scab on her right ankle, this bled significantly and she was found down by EMS. Brought to the ED and taken to the OR for ligation. Troponins found to be elevated. Cardiology was consulted for further evaluation.   # Hemorrhagic shock # Right LE ulcer, varicose veins # NSTEMI - type I vs type II # Hypertension Patient reported to ED with bleeding on right ankle that required ligation. Patient without chest pain/pressure or SOB. Troponins elevated and trended 56 > 1133 > 2130 > 2674 > 1781.  Initial EKG in ED concern for ST elevation in inferior and anterior leads then 4 hours later the ST elevation became less prominent on repeat EKG. S/p blood transfusion upon admission. Echo this admission with EF 35 - 40%, with RWMA, moderate MR, SAM of mitral leaflet with severe LVOT obstruction (peak gradient 220 mg). Abnormalities on echo can suggest stress cardiomyopathy vs. Multivessel CAD.  for BP and HR stable this afternoon. Hgb and Cr stable today.  -Discussed the risks and benefits of proceeding with LHC for further evaluation with the patient. Case also discussed with her primary cardiologist Dr. Beau Bound. She is agreeable to proceed.  Patient  has been NPO. LHC this afternoon (05/08 at 1530) with Dr. Beau Bound.  Written consent will be obtained.  Further recommendations following LHC.    -Continue heparin gtt. -Continue ASA 81 mg daily.  -Start Zetia 10 mg daily. Patient has history of intolerance to statin. -Recommend continue IVF with SAM.  -Hold home PO diltiazem.  -Defer home PO lasix at this time. Patient appears euvolemic on exam.   This patient's plan of care was discussed and created with Dr. Bob Burn and he is in agreement.  Signed: Creighton Doffing, PA-C  09/04/2023, 1:44 PM [facility] Cardiology

## 2023-09-04 NOTE — Progress Notes (Signed)
 PHARMACY - ANTICOAGULATION CONSULT NOTE  Pharmacy Consult for heparin Indication: chest pain/ACS  Allergies  Allergen Reactions   Codeine     Double Vision   Percocet [Oxycodone -Acetaminophen ]     Occular Migraine   Timolol Other (See Comments)    Pt states her heart stopped.    Anise Flavoring Agent (Non-Screening) Palpitations and Other (See Comments)    Feels like she is going to pass out   Ciprofloxacin Palpitations   Levofloxacin Palpitations   Licorice Flavor [Flavoring Agent (Non-Screening)] Palpitations    Patient Measurements: Height: 5\' 9"  (175.3 cm) Weight: 54.4 kg (120 lb) IBW/kg (Calculated) : 66.2 HEPARIN DW (KG): 54.4  Vital Signs: Temp: 97.9 F (36.6 C) (05/07 2338) Temp Source: Oral (05/07 2338) BP: 145/79 (05/07 2338) Pulse Rate: 92 (05/07 2338)  Labs: Recent Labs    09/02/23 1149 09/02/23 1358 09/02/23 1828 09/02/23 2234 09/03/23 0520 09/03/23 0943 09/03/23 1651 09/04/23 0100  HGB 9.7*  --  11.1*  --  10.0*  --   --  9.4*  HCT 29.9*  --  33.1*  --  29.6*  --   --  28.4*  PLT 212  --  194  --  177  --   --  147*  LABPROT 16.1*  --   --   --   --   --  14.9  --   INR 1.3*  --   --   --   --   --  1.1  --   HEPARINUNFRC  --   --   --   --   --   --   --  0.10*  CREATININE 0.83  --   --   --  0.73  --   --   --   TROPONINIHS 56*   < > 2,130* 2,674*  --  1,781*  --   --    < > = values in this interval not displayed.    Estimated Creatinine Clearance: 40.9 mL/min (by C-G formula based on SCr of 0.73 mg/dL).   Medical History: Past Medical History:  Diagnosis Date   Diverticulitis    Glaucoma    High blood pressure    Osteoarthritis    knees   Osteoporosis 10/05/2015   Vitamin D  deficiency 09/18/2015   Assessment: 89 y/o female presenting with acute blood loss from varicose vein on right ankle s/p ligation in OR on 09/02/23. PMH significant for Hypertension, congenital mitral valve prolapse, hyperlipidemia, aortic valve sclerosis,  osteoporosis. Found to have elevated troponin levels on admission. Pharmacy consulted to initiate heparin infusion. Per chart review, patient is not on anticoagulation prior to admission.  Baseline labs: hgb 10, plt 177, INR pending  Goal of Therapy:  Heparin level 0.3-0.7 units/ml Monitor platelets by anticoagulation protocol: Yes   Plan:  5/8:  HL @ 0100 = 0.10, SUBtherapeutic  - Will order heparin 1600 units IV X 1 bolus and increase drip rate to 850 units/hr - Will recheck HL 8 hrs after rate change - CBC daily   Thank you for involving pharmacy in this patient's care.   Shravan Salahuddin D Clinical Pharmacist 09/04/2023 1:37 AM

## 2023-09-04 NOTE — Progress Notes (Signed)
 Heart Failure Navigator Progress Note  Assessed for Heart & Vascular TOC clinic readiness.  Does not meet criteria due to current [place] clinic patient..   Navigator will sign off at this time.  Celedonio Coil, RN, BSN [facility] Heart Failure Navigator Secure Chat Only

## 2023-09-05 ENCOUNTER — Inpatient Hospital Stay

## 2023-09-05 LAB — LIPID PANEL
Cholesterol: 138 mg/dL (ref 0–200)
HDL: 52 mg/dL (ref >40)
LDL Cholesterol: 73 mg/dL (ref 0–99)
Total CHOL/HDL Ratio: 2.7 ratio
Triglycerides: 63 mg/dL (ref <150)
VLDL: 13 mg/dL (ref 0–40)

## 2023-09-05 LAB — MAGNESIUM: Magnesium: 1.8 mg/dL (ref 1.7–2.4)

## 2023-09-05 LAB — CBC
HCT: 27.1 % — ABNORMAL LOW (ref 36.0–46.0)
Hemoglobin: 9.1 g/dL — ABNORMAL LOW (ref 12.0–15.0)
MCH: 31.6 pg (ref 26.0–34.0)
MCHC: 33.6 g/dL (ref 30.0–36.0)
MCV: 94.1 fL (ref 80.0–100.0)
Platelets: 156 10*3/uL (ref 150–400)
RBC: 2.88 MIL/uL — ABNORMAL LOW (ref 3.87–5.11)
RDW: 13.6 % (ref 11.5–15.5)
WBC: 7 10*3/uL (ref 4.0–10.5)
nRBC: 0 % (ref 0.0–0.2)

## 2023-09-05 LAB — BASIC METABOLIC PANEL WITH GFR
Anion gap: 6 (ref 5–15)
BUN: 17 mg/dL (ref 8–23)
CO2: 23 mmol/L (ref 22–32)
Calcium: 7.9 mg/dL — ABNORMAL LOW (ref 8.9–10.3)
Chloride: 107 mmol/L (ref 98–111)
Creatinine, Ser: 0.65 mg/dL (ref 0.44–1.00)
GFR, Estimated: >60 mL/min (ref >60)
Glucose, Bld: 96 mg/dL (ref 70–99)
Potassium: 3.6 mmol/L (ref 3.5–5.1)
Sodium: 136 mmol/L (ref 135–145)

## 2023-09-05 MED ORDER — ASPIRIN 81 MG PO TBEC: 81.0000 mg | DELAYED_RELEASE_TABLET | Freq: Every day | ORAL | Status: DC

## 2023-09-05 MED ORDER — EZETIMIBE 10 MG PO TABS: 10.0000 mg | ORAL_TABLET | Freq: Every day | ORAL | 2 refills | Status: AC

## 2023-09-05 MED ORDER — METOPROLOL SUCCINATE ER 25 MG PO TB24
25.0000 mg | ORAL_TABLET | Freq: Every day | ORAL | Status: DC
  Administered 2023-09-05: 25 mg via ORAL
  Filled 2023-09-05: qty 1

## 2023-09-05 MED ORDER — VITAMIN D (CHOLECALCIFEROL) 10 MCG (400 UNIT) PO TABS: 400.0000 [IU] | ORAL_TABLET | Freq: Two times a day (BID) | ORAL | Status: AC

## 2023-09-05 MED ORDER — LOSARTAN POTASSIUM 25 MG PO TABS: 12.5000 mg | ORAL_TABLET | Freq: Every day | ORAL | 2 refills | Status: AC

## 2023-09-05 MED ORDER — CLOPIDOGREL BISULFATE 75 MG PO TABS: 75.0000 mg | ORAL_TABLET | Freq: Every day | ORAL | 2 refills | Status: AC

## 2023-09-05 MED ORDER — TRAMADOL HCL 50 MG PO TABS
50.0000 mg | ORAL_TABLET | Freq: Four times a day (QID) | ORAL | 0 refills | Status: DC | PRN
Start: 2023-09-05 — End: 2024-01-12

## 2023-09-05 MED ORDER — LOSARTAN POTASSIUM 25 MG PO TABS
12.5000 mg | ORAL_TABLET | Freq: Every day | ORAL | Status: DC
  Administered 2023-09-05: 12.5 mg via ORAL
  Filled 2023-09-05: qty 1

## 2023-09-05 MED ORDER — ASPIRIN 81 MG PO TBEC: 81.0000 mg | DELAYED_RELEASE_TABLET | Freq: Every day | ORAL | 0 refills | Status: AC

## 2023-09-05 MED ORDER — METOPROLOL SUCCINATE ER 25 MG PO TB24: 25.0000 mg | ORAL_TABLET | Freq: Every day | ORAL | 2 refills | Status: AC

## 2023-09-05 NOTE — Plan of Care (Signed)

## 2023-09-05 NOTE — Discharge Summary (Signed)
 Physician Discharge Summary   Anne Ferrell  female DOB: Feb 09, 1933  WGN:[id number]  PCP: Scherrie Curt, MD  Admit date: 09/02/2023 Discharge date: 09/05/2023  Admitted From: home Disposition:  home Son updated at bedside prior to discharge. CODE STATUS: Full code  Discharge Instructions     AMB Referral to Cardiac Rehabilitation - Phase II   Complete by: As directed    Diagnosis: NSTEMI   After initial evaluation and assessments completed: Virtual Based Care may be provided alone or in conjunction with Phase 2 Cardiac Rehab based on patient barriers.: Yes   Intensive Cardiac Rehabilitation (ICR) [facility] location only OR Traditional Cardiac Rehabilitation (TCR) *If criteria for ICR are not met will enroll in TCR [facility] only): Yes   Diet - low sodium heart healthy   Complete by: As directed       Hospital Course:  For full details, please see H&P, progress notes, consult notes and ancillary notes.  Briefly,  Anne Ferrell is a 89 y.o. female with medical history significant of Hypertension, congenital mitral valve prolapse, aortic valve sclerosis, who presented to the ED due to unresponsiveness from bleeding.   Anne Ferrell states that she was in her usual state of health earlier today when she noticed a scab on her right ankle.  She picked the scab and noted sudden onset severe bleeding from the spot.  She soaked approximately 2 towels full of blood.  Then she began to feel faint with shortness of breath and lost consciousness.  Prior to doing so, she was on the phone with her daughter and so EMS was called.   Per chart review, on EMS arrival, patient was lying in a large pool of blood approximated to be at least 2 L, agonal breathing.  Pulses were unable to be palpated.  She was bagged several times and then pulses returned without initiation of CPR.   * Hemorrhagic shock (HCC) Acute blood loss anemia  Patient presented with hemorrhagic shock in the setting of a ruptured  varicose vein with estimated 2 L of blood loss by EMS.   --Hgb 9.7 on presentation.  s/p 1u pRBC.  Hgb was stable around 9's prior to discharge.  Varicose veins of right lower extremity with ulcer of ankle limited to breakdown of skin [facility]) Vascular surgery consulted due to severe bleed from lower extremity with their examination concerning for ruptured varicose vein.   --s/p ligation and sclerotherapy of bleeding varicosity on 5/6 --Keep the Unna boot on and follow up with vascular on Monday next week in clinic for unna boot removal.    Orthostasis 2/2 recent severe bleeding and volume depletion --s/p MIVF with improvement.  Repeat orthostatic BP neg.   Trop elevation 2/2 demand ischemia Stress cardiomyopathy --Initial troponin of 56, which trended up to 2000's, so cardio consulted. Left heart catheterization with 50% mid LAD hazy lesion.  --started on low-dose Toprol-XL 25 mg daily and losartan 12.5 mg daily  --discharged on aspirin  and Plavix  for 1 week. After 1 week stop aspirin  and continue Plavix .  --cardio to  arrange for follow up in clinic with Dr. Beau Bound in 1-2 weeks.    Essential hypertension - d/c'ed home dilt and lasix --started on Toprol and losartan   Systolic CHF --unclear chronicity  --current Echo showed EF 35-40%, LV RWMA. Cardio favors stress cardiomyopathy --started on Toprol and losartan   Unless noted above, medications under "STOP" list are ones pt was not taking PTA.  Discharge Diagnoses:  Principal Problem:  Hemorrhagic shock (HCC) Active Problems:   Demand ischemia (HCC)   Varicose veins of right lower extremity with ulcer of ankle limited to breakdown of skin (HCC)   Essential hypertension   Hemorrhage   Blood loss anemia   30 Day Unplanned Readmission Risk Score    Flowsheet Row ED to Hosp-Admission (Current) from 09/02/2023 in [facility] PCU  30 Day Unplanned Readmission Risk Score (%) 9.61 Filed at 09/05/2023 1200        This score is the patient's risk of an unplanned readmission within 30 days of being discharged (0 -100%). The score is based on dignosis, age, lab data, medications, orders, and past utilization.   Low:  0-14.9   Medium: 15-21.9   High: 22-29.9   Extreme: 30 and above         Discharge Instructions:  Allergies as of 09/05/2023       Reactions   Codeine    Double Vision   Percocet [oxycodone -acetaminophen ]    Occular Migraine   Timolol Other (See Comments)   Pt states her heart stopped.    Anise Flavoring Agent (non-screening) Palpitations, Other (See Comments)   Feels like she is going to pass out   Ciprofloxacin Palpitations   Levofloxacin Palpitations   Licorice Flavor [flavoring Agent (non-screening)] Palpitations        Medication List     STOP taking these medications    diltiazem  120 MG tablet Commonly known as: CARDIZEM    furosemide 20 MG tablet Commonly known as: LASIX   Magnesium  250 MG Tabs       TAKE these medications    aspirin  EC 81 MG tablet Take 1 tablet (81 mg total) by mouth daily for 7 days. Swallow whole. Start taking on: Sep 06, 2023   clopidogrel  75 MG tablet Commonly known as: PLAVIX  Take 1 tablet (75 mg total) by mouth daily with breakfast. Start taking on: Sep 06, 2023   cyanocobalamin  1000 MCG tablet Commonly known as: VITAMIN B12 Take 1,000 mcg by mouth daily.   ezetimibe  10 MG tablet Commonly known as: ZETIA  Take 1 tablet (10 mg total) by mouth daily. Start taking on: Sep 06, 2023   losartan 25 MG tablet Commonly known as: COZAAR Take 0.5 tablets (12.5 mg total) by mouth daily. Start taking on: Sep 06, 2023   metoprolol succinate 25 MG 24 hr tablet Commonly known as: TOPROL-XL Take 1 tablet (25 mg total) by mouth daily. Start taking on: Sep 06, 2023   PreserVision AREDS Caps Take 1 capsule by mouth daily.   traMADol  50 MG tablet Commonly known as: ULTRAM  Take 1 tablet (50 mg total) by mouth every 6 (six)  hours as needed for moderate pain (pain score 4-6) or severe pain (pain score 7-10).   VITAMIN A  PO Take 1 tablet by mouth daily. 10,000 units   Vitamin D  (Cholecalciferol ) 10 MCG (400 UNIT) Tabs Take 400 Units by mouth in the morning and at bedtime. Home med. What changed:  how much to take additional instructions         Follow-up Information     Schnier, Ninette Basque, MD Follow up in 5 day(s).   Specialties: Vascular Surgery, Cardiology, Radiology, Vascular Surgery Why: Okay to overbook for Dr schnier in clinic on Monday. Contact information: [address] [place] [address] [phone]         Antonette Batters, MD. Go in 1 week(s).   Specialties: Cardiology, Internal Medicine Contact information:  [address] [place] [address] [phone]                 Allergies  Allergen Reactions   Codeine     Double Vision   Percocet [Oxycodone -Acetaminophen ]     Occular Migraine   Timolol Other (See Comments)    Pt states her heart stopped.    Anise Flavoring Agent (Non-Screening) Palpitations and Other (See Comments)    Feels like she is going to pass out   Ciprofloxacin Palpitations   Levofloxacin Palpitations   Licorice Flavor [Flavoring Agent (Non-Screening)] Palpitations     The results of significant diagnostics from this hospitalization (including imaging, microbiology, ancillary and laboratory) are listed below for reference.   Consultations:   Procedures/Studies: US  Venous Img Lower Unilateral Right (DVT) Result Date: 09/05/2023 CLINICAL DATA:  Femoral DVT with thrombophlebitis. Pain. Varicose veins. EXAM: RIGHT LOWER EXTREMITY VENOUS DOPPLER ULTRASOUND TECHNIQUE: Gray-scale sonography with compression, as well as color and duplex ultrasound, were performed to evaluate the deep venous system(s) from the level of the common femoral vein through the popliteal and proximal calf veins. COMPARISON:  None available  FINDINGS: VENOUS Normal compressibility of the common femoral, superficial femoral, and popliteal veins, as well as the visualized calf veins. Visualized portions of profunda femoral vein and great saphenous vein unremarkable. No filling defects to suggest DVT on grayscale or color Doppler imaging. Doppler waveforms show normal direction of venous flow, normal respiratory plasticity and response to augmentation. Limited views of the contralateral common femoral vein are unremarkable. OTHER None. Limitations: none IMPRESSION: No right lower extremity DVT. Electronically Signed   By: Elester Grim M.D.   On: 09/05/2023 11:10   ECHOCARDIOGRAM LIMITED Result Date: 09/03/2023    ECHOCARDIOGRAM LIMITED REPORT   Patient Name:   Anne Ferrell Date of Exam: 09/03/2023 Medical Rec #:  [id number]         Height:       69.0 in Accession #:    [id number]        Weight:       120.0 lb Date of Birth:  Jun 16, 1932          BSA:          1.663 m Patient Age:    89 years          BP:           134/77 mmHg Patient Gender: F                 HR:           80 bpm. Exam Location:  [facility] Procedure: Limited Echo, Color Doppler, Cardiac Doppler and Intracardiac            Opacification Agent (Both Spectral and Color Flow Doppler were            utilized during procedure). Indications:     Shock R57.9  History:         Patient has prior history of Echocardiogram examinations, most                  recent 09/16/2017. High blodd pressure.  Sonographer:     Broadus Canes Referring Phys:  [id number] Avi Body Diagnosing Phys: Lida Reeks Alluri IMPRESSIONS  1. This is a limited echo.  2. Left ventricular ejection fraction, by estimation, is 35 to 40%. The left ventricle has moderately decreased function. The left ventricle demonstrates regional wall motion abnormalities (see scoring diagram/findings for description).  3. Right ventricular  systolic function is normal. The right ventricular size is normal.  4. Moderate mitral valve regurgitation.  5.  Systolic anterior motion of mitral leaflet with severe LVOT obstruction (peak gradient 220 mmHg). The aortic valve was not well visualized. FINDINGS  Left Ventricle: Left ventricular ejection fraction, by estimation, is 35 to 40%. The left ventricle has moderately decreased function. The left ventricle demonstrates regional wall motion abnormalities. Definity  contrast agent was given IV to delineate the left ventricular endocardial borders. The left ventricular internal cavity size was normal in size.  LV Wall Scoring: The entire apex is akinetic. The mid anteroseptal segment, mid inferolateral segment, mid anterolateral segment, mid inferoseptal segment, mid anterior segment, and mid inferior segment are hypokinetic. The basal anteroseptal segment, basal inferolateral segment, basal anterolateral segment, basal anterior segment, basal inferior segment, and basal inferoseptal segment are normal. Right Ventricle: The right ventricular size is normal. No increase in right ventricular wall thickness. Right ventricular systolic function is normal. Mitral Valve: There is mild thickening of the mitral valve leaflet(s). Moderate mitral valve regurgitation. Tricuspid Valve: The tricuspid valve is not well visualized. Tricuspid valve regurgitation is trivial. Aortic Valve: Systolic anterior motion of mitral leaflet with severe LVOT obstruction (peak gradient 220 mmHg). The aortic valve was not well visualized. Aortic valve mean gradient measures 39.3 mmHg. Aortic valve peak gradient measures 97.7 mmHg. Aortic  valve area, by VTI measures 5.88 cm. Additional Comments: Spectral Doppler performed. Color Doppler performed.  LEFT VENTRICLE PLAX 2D LVIDd:         4.30 cm LVIDs:         2.40 cm LV PW:         0.80 cm LV IVS:        1.00 cm LVOT diam:     2.00 cm LV SV:         380 LV SV Index:   229 LVOT Area:     3.14 cm  LV Volumes (MOD) LV vol d, MOD A2C: 63.4 ml LV vol d, MOD A4C: 96.7 ml LV vol s, MOD A2C: 50.0 ml LV vol s,  MOD A4C: 63.4 ml LV SV MOD A2C:     13.4 ml LV SV MOD A4C:     96.7 ml LV SV MOD BP:      23.1 ml AORTIC VALVE AV Area (Vmax):    4.73 cm AV Area (Vmean):   6.10 cm AV Area (VTI):     5.88 cm AV Vmax:           494.33 cm/s AV Vmean:          263.667 cm/s AV VTI:            0.646 m AV Peak Grad:      97.7 mmHg AV Mean Grad:      39.3 mmHg LVOT Vmax:         744.00 cm/s LVOT Vmean:        512.000 cm/s LVOT VTI:          1.210 m LVOT/AV VTI ratio: 1.87 TRICUSPID VALVE TR Peak grad:   21.7 mmHg TR Vmax:        233.00 cm/s  SHUNTS Systemic VTI:  1.21 m Systemic Diam: 2.00 cm Joetta Mustache Electronically signed by Joetta Mustache Signature Date/Time: 09/03/2023/3:08:38 PM    Final       Labs: BNP (last 3 results) No results for input(s): "BNP" in the last 8760 hours. Basic Metabolic Panel: Recent Labs  Lab 09/02/23 1149  09/03/23 0520 09/04/23 0550 09/05/23 0516  NA 137 137 137 136  K 3.8 3.9 3.7 3.6  CL 108 107 106 107  CO2 19* 22 22 23   GLUCOSE 239* 115* 105* 96  BUN 23 19 16 17   CREATININE 0.83 0.73 0.56 0.65  CALCIUM  7.9* 8.1* 7.8* 7.9*  MG  --   --  1.8 1.8   Liver Function Tests: Recent Labs  Lab 09/02/23 1149  AST 29  ALT 20  ALKPHOS 50  BILITOT 0.8  PROT 4.3*  ALBUMIN 2.9*   No results for input(s): "LIPASE", "AMYLASE" in the last 168 hours. No results for input(s): "AMMONIA" in the last 168 hours. CBC: Recent Labs  Lab 09/02/23 1149 09/02/23 1828 09/03/23 0520 09/04/23 0100 09/05/23 0516  WBC 7.9 14.4* 9.8 7.2 7.0  HGB 9.7* 11.1* 10.0* 9.4* 9.1*  HCT 29.9* 33.1* 29.6* 28.4* 27.1*  MCV 96.1 92.5 93.4 94.4 94.1  PLT 212 194 177 147* 156   Cardiac Enzymes: No results for input(s): "CKTOTAL", "CKMB", "CKMBINDEX", "TROPONINI" in the last 168 hours. BNP: Invalid input(s): "POCBNP" CBG: No results for input(s): "GLUCAP" in the last 168 hours. D-Dimer No results for input(s): "DDIMER" in the last 72 hours. Hgb A1c No results for input(s): "HGBA1C" in the last  72 hours. Lipid Profile Recent Labs    09/05/23 0516  CHOL 138  HDL 52  LDLCALC 73  TRIG 63  CHOLHDL 2.7   Thyroid  function studies No results for input(s): "TSH", "T4TOTAL", "T3FREE", "THYROIDAB" in the last 72 hours.  Invalid input(s): "FREET3" Anemia work up No results for input(s): "VITAMINB12", "FOLATE", "FERRITIN", "TIBC", "IRON", "RETICCTPCT" in the last 72 hours. Urinalysis    Component Value Date/Time   COLORURINE YELLOW (A) 02/24/2015 2123   APPEARANCEUR CLEAR (A) 02/24/2015 2123   LABSPEC 1.024 02/24/2015 2123   PHURINE 5.0 02/24/2015 2123   GLUCOSEU NEGATIVE 02/24/2015 2123   HGBUR NEGATIVE 02/24/2015 2123   BILIRUBINUR NEGATIVE 02/24/2015 2123   KETONESUR TRACE (A) 02/24/2015 2123   PROTEINUR NEGATIVE 02/24/2015 2123   NITRITE NEGATIVE 02/24/2015 2123   LEUKOCYTESUR NEGATIVE 02/24/2015 2123   Sepsis Labs Recent Labs  Lab 09/02/23 1828 09/03/23 0520 09/04/23 0100 09/05/23 0516  WBC 14.4* 9.8 7.2 7.0   Microbiology No results found for this or any previous visit (from the past 240 hours).   Total time spend on discharging this patient, including the last patient exam, discussing the hospital stay, instructions for ongoing care as it relates to all pertinent caregivers, as well as preparing the medical discharge records, prescriptions, and/or referrals as applicable, is 45 minutes.    Garrison Kanner, MD  [facility] 09/05/2023, 1:16 PM

## 2023-09-05 NOTE — Evaluation (Signed)
 Physical Therapy Evaluation Patient Details Name: Anne Ferrell MRN: [id number] DOB: 01-21-1933 Today's Date: 09/05/2023  History of Present Illness  Anne Ferrell is a 89 y.o. female  with a past medical history of hypertension, tachycardia, aortic sclerosis, congenital mitral valve prolapse, amaurosis fugax who presented to the ED on 09/02/2023 for unresponsiveness, significantly bleeding.   Clinical Impression  Pt admitted with above diagnosis. Pt currently with functional limitations due to the deficits listed below (see PT Problem List). Pt received upright in bed agreeable to PT services. Reports PTA being fully independent living at home alone.   To date, pt with R radial bandage in place and RLE wrapped. Pt ambulating independently to mod-I fro bed mobility, STS, and ambulation with RW. Pt completes ~100' of reciprocal gait at safe, short community distances as evidenced by gait speed test. Pt did require education on minimizing pressure through R wrist/hand due to heart cath on 09/04/23. Pt denying stair attempts stating feeling comfortable being able to complete. Discussed using L rail and LE sequencing for leading with non painful limb ascending and descending with painful limb for comfort. Pt understanding returning upright in bed. Education provided on strategies for meal prep and energy conservation techniques to self manage while using RW at home. Pt close to baseline but would benefit from skilled PT services at home to ensure safe transition to previous environment and wean to LRAD/PLOF. All needs in reach.      If plan is discharge home, recommend the following: A little help with walking and/or transfers;Assistance with cooking/housework;Assist for transportation;Help with stairs or ramp for entrance   Can travel by private vehicle        Equipment Recommendations None recommended by PT  Recommendations for Other Services       Functional Status Assessment Patient has  had a recent decline in their functional status and demonstrates the ability to make significant improvements in function in a reasonable and predictable amount of time.     Precautions / Restrictions Precautions Precautions: None Recall of Precautions/Restrictions: Intact Restrictions Weight Bearing Restrictions Per Provider Order: No Other Position/Activity Restrictions: R radial heart cath on 09/04/23      Mobility  Bed Mobility Overal bed mobility: Independent               Patient Response: Cooperative  Transfers Overall transfer level: Independent Equipment used: Rolling walker (2 wheels)                    Ambulation/Gait Ambulation/Gait assistance: Modified independent (Device/Increase time) Gait Distance (Feet): 100 Feet Assistive device: Rolling walker (2 wheels) Gait Pattern/deviations: Step-through pattern, Antalgic Gait velocity: 10' in 6 sec = 1.66'/sec Gait velocity interpretation: 1.31 - 2.62 ft/sec, indicative of limited community ambulator   General Gait Details: Mildly antalgic on RLE due to bandaging and pain. Encouraged limiting RUE WB through wrist/hand with good compliance.  Stairs            Wheelchair Mobility     Tilt Bed Tilt Bed Patient Response: Cooperative  Modified Rankin (Stroke Patients Only)       Balance Overall balance assessment: Mild deficits observed, not formally tested                                           Pertinent Vitals/Pain Pain Assessment Pain Assessment: Faces Faces Pain Scale: Hurts a little bit Pain  Location: R ankle/foot Pain Descriptors / Indicators: Discomfort, Tightness, Tingling Pain Intervention(s): Monitored during session, Repositioned, Limited activity within patient's tolerance    Home Living Family/patient expects to be discharged to:: Private residence Living Arrangements: Alone Available Help at Discharge: Family Type of Home: House Home Access: Stairs  to enter Entrance Stairs-Rails: Left Entrance Stairs-Number of Steps: 2-3 from garage   Home Layout: Two level;Able to live on main level with bedroom/bathroom Home Equipment: Rolling Walker (2 wheels);Cane - single point      Prior Function Prior Level of Function : Independent/Modified Independent               ADLs Comments: Daughter can check in after work hours if needed but normally independent     Extremity/Trunk Assessment   Upper Extremity Assessment Upper Extremity Assessment: Overall WFL for tasks assessed;RUE deficits/detail RUE Deficits / Details: R radial bandage intact and dry pre and post session. No evidence of bleeding.    Lower Extremity Assessment Lower Extremity Assessment: Overall WFL for tasks assessed    Cervical / Trunk Assessment Cervical / Trunk Assessment: Normal  Communication   Communication Communication: No apparent difficulties    Cognition Arousal: Alert Behavior During Therapy: WFL for tasks assessed/performed   PT - Cognitive impairments: No apparent impairments                         Following commands: Intact       Cueing Cueing Techniques: Verbal cues     General Comments General comments (skin integrity, edema, etc.): R radial bandage in place    Exercises     Assessment/Plan    PT Assessment All further PT needs can be met in the next venue of care  PT Problem List Decreased strength;Decreased activity tolerance;Decreased balance;Pain       PT Treatment Interventions      PT Goals (Current goals can be found in the Care Plan section)  Acute Rehab PT Goals Patient Stated Goal: to go home PT Goal Formulation: With patient Time For Goal Achievement: 09/19/23 Potential to Achieve Goals: Good    Frequency       Co-evaluation               AM-PAC PT "6 Clicks" Mobility  Outcome Measure Help needed turning from your back to your side while in a flat bed without using bedrails?: None Help  needed moving from lying on your back to sitting on the side of a flat bed without using bedrails?: None Help needed moving to and from a bed to a chair (including a wheelchair)?: None Help needed standing up from a chair using your arms (e.g., wheelchair or bedside chair)?: A Little Help needed to walk in hospital room?: A Little Help needed climbing 3-5 steps with a railing? : A Little 6 Click Score: 21    End of Session   Activity Tolerance: Patient tolerated treatment well Patient left: in bed Nurse Communication: Mobility status PT Visit Diagnosis: Other abnormalities of gait and mobility (R26.89);Muscle weakness (generalized) (M62.81);Pain Pain - Right/Left: Right Pain - part of body: Ankle and joints of foot    Time: 1210-1230 PT Time Calculation (min) (ACUTE ONLY): 20 min   Charges:   PT Evaluation $PT Eval Low Complexity: 1 Low   PT General Charges $$ ACUTE PT VISIT: 1 Visit        Marc Senior. Fairly IV, PT, DPT Physical Therapist- [place]  [facility]  09/05/2023, 1:37 PM

## 2023-09-05 NOTE — Progress Notes (Addendum)
 Heart Failure Stewardship Pharmacy Note  PCP: Scherrie Curt, MD PCP-Cardiologist: None  HPI: Anne Ferrell is a 89 y.o. female with HTN, congenital mitral valve prolapse, HLD, aortic valve sclerosis, and osteoporosis who presented with severe bleeding from varicose vein. On admission, HS-troponin was 56 and Hgb 9.7. Chest x-ray not taken at admission.    Pertinent cardiac history: Echo 02/2015 showed EF 57% and grade 1 diastolic dysfunction. Echo 02/2016 showed EF 65% with grade 1 diastolic dysfunction. Echo 08/2016 showed EF 82% and grade 1 diastolic dysfunction. Echo 08/2017 showed EF 79% and grade 1 diastolic dysfunction. Most recent Echo 09/03/23 showed EF 35-40% with moderate mitral valve regurgitation. Cardiac cath on 09/04/23 showed no obstructive CAD, LVEF estimated at 35%, and apical hypokinesis noted to be consistent with Takotsubo pattern.  Pertinent Lab Values: Creatinine, Ser  Date Value Ref Range Status  09/05/2023 0.65 0.44 - 1.00 mg/dL Final   BUN  Date Value Ref Range Status  09/05/2023 17 8 - 23 mg/dL Final   Potassium  Date Value Ref Range Status  09/05/2023 3.6 3.5 - 5.1 mmol/L Final   Sodium  Date Value Ref Range Status  09/05/2023 136 135 - 145 mmol/L Final   B Natriuretic Peptide  Date Value Ref Range Status  02/24/2015 57.0 0.0 - 100.0 pg/mL Final   Magnesium   Date Value Ref Range Status  09/05/2023 1.8 1.7 - 2.4 mg/dL Final    Comment:    Performed at [facility], [address] [place], [place] [address]   Hgb A1c MFr Bld  Date Value Ref Range Status  12/18/2022 5.7 4.6 - 6.5 % Final    Comment:    Glycemic Control Guidelines for People with Diabetes:Non Diabetic:  <6%Goal of Therapy: <7%Additional Action Suggested:  >8%    TSH  Date Value Ref Range Status  12/18/2022 0.97 0.35 - 5.50 uIU/mL Final    Vital Signs: Temp:  [97.7 F (36.5 C)-99.1 F (37.3 C)] 98.5 F (36.9 C) (05/09 0445) Pulse Rate:  [75-106] 75 (05/09  0445) Cardiac Rhythm: Normal sinus rhythm (05/08 2010) Resp:  [16-21] 18 (05/09 0445) BP: (113-143)/(57-79) 113/64 (05/09 0445) SpO2:  [94 %-100 %] 97 % (05/09 0445) No intake or output data in the 24 hours ending 09/05/23 0729  Current Heart Failure Medications:  Loop diuretic: none Beta-Blocker: metoprolol succinate 25 mg daily  ACEI/ARB/ARNI: losartan 12.5 mg daily  MRA: none SGLT2i: none Other: none  Prior to admission Heart Failure Medications:  Loop diuretic: furosemide 20 mg daily  Beta-Blocker: none ACEI/ARB/ARNI: none MRA: none SGLT2i: none Other: diltiazem  60 mg BID   Assessment: 1. Acute systolic heart failure (LVEF 35-40%), due to Takotsubo. NYHA class I symptoms.  -Symptoms: Patient denies dyspnea and LEE. Reports she has no symptoms today besides leg pain.  -Volume: Patient appears euvolemic today. Creatinine and BUN stable.  -Hemodynamics: BP normal. HR 70-90s.  -BB: Continue metoprolol succinate 25 mg daily.  -ACEI/ARB/ARNI: Continue losartan 12.5 mg daily.  -MRA: Can consider spironolactone 12.5 mg daily outpatient if stable. May be able to stop PTA furosemide if initiated. -SGLT2i: Would defer at this time in favor of cost-effectiveness discussion with the patient due to age and origin of cardiomyopathy suspected to be takotsubo.   Plan: 1) Medication changes recommended at this time: - Consider stopping PTA diltiazem  at discharge with new low EF  2) Patient assistance: - Pending   3) Education: -To be completed prior to discharge. - Patient has been educated on current HF  medications and potential additions to HF medication regimen - Patient verbalizes understanding that over the next few months, these medication doses may change and more medications may be added to optimize HF regimen - Patient has been educated on basic disease state pathophysiology and goals of therapy   Medication Assistance / Insurance Benefits Check: Does the patient have  prescription insurance?  [employer]   Outpatient Pharmacy: Prior to admission outpatient pharmacy: [facility]    Please do not hesitate to reach out with questions or concerns,  Bevely Brush, PharmD, CPP, BCPS Heart Failure Pharmacist  Phone - [phone] 09/05/2023 10:15 AM

## 2023-09-05 NOTE — TOC Transition Note (Addendum)
 Transition of Care [facility]) - Discharge Note   Patient Details  Name: Anne Ferrell MRN: [id number] Date of Birth: [birth year]  Transition of Care [facility]) CM/SW Contact:  Gracen Southwell C Keyonte Cookston, RN Phone Number: 09/05/2023, 3:31 PM   Clinical Narrative:    Spoke with patient's daughter Oralee Billow to advise of therapy's recommendation for HHPT. [name] stated she was aware patient was discharging but had recently spoken with her brother who is at the bedside. Patient is having foot pain. MD notified.   3:00pm Spoke with [name]. Patient is still discharging home. She was provided with choices for [facility] including [facility], [facility], [facility], [facility], [facility] and [facility]. [name] will make a decision and return call to this CM.   3:50pm  Spoke with [name]. She has chosen [facility].  Referral sent and accepted by Shaun from [facility]   TOC signing off.          Patient Goals and CMS Choice            Discharge Placement                       Discharge Plan and Services Additional resources added to the After Visit Summary for                                       Social Drivers of Health (SDOH) Interventions SDOH Screenings   Food Insecurity: No Food Insecurity (09/03/2023)  Housing: Low Risk  (09/03/2023)  Transportation Needs: No Transportation Needs (09/03/2023)  Utilities: Not At Risk (09/03/2023)  Alcohol Screen: Low Risk  (11/11/2022)  Depression (PHQ2-9): Low Risk  (11/11/2022)  Financial Resource Strain: Low Risk  (11/11/2022)  Physical Activity: Insufficiently Active (11/11/2022)  Social Connections: Socially Isolated (09/03/2023)  Stress: No Stress Concern Present (11/11/2022)  Tobacco Use: Low Risk  (09/02/2023)  Health Literacy: Adequate Health Literacy (11/11/2022)     Readmission Risk Interventions     No data to display

## 2023-09-05 NOTE — Progress Notes (Signed)
 [facility] CLINIC CARDIOLOGY PROGRESS NOTE       Patient ID: Anne Ferrell MRN: [id number] DOB/AGE: 1933/02/21 89 y.o.  Admit date: 09/02/2023 Referring Physician Dr. Garrison Kanner Primary Physician Copland, Jolena Nay, MD  Primary Cardiologist Dr. Beau Bound Reason for Consultation NSTEMI  HPI: Anne Ferrell is a 89 y.o. female  with a past medical history of hypertension, tachycardia, aortic sclerosis, congenital mitral valve prolapse, amaurosis fugax who presented to the ED on 09/02/2023 for unresponsiveness, significantly bleeding.  Was at home and had picked a scab on her right ankle, this bled significantly and she was found down by EMS - pulseless and agonal breathing. Brought to the ED and taken to the OR for ligation. Troponins found to be elevated. Cardiology was consulted for further evaluation.   Interval History: -Patient seen and examined this afternoon and laying comfortably in hospital bed. Patient states she's feeling well overall without any chest pain or SOB. -Patients BP and HR are stable this afternoon. Overnight Tele showed no significant events.  -LHC done yesterday afternoon with Dr. Beau Bound without PCI.  -Incision site at right radial is clean and dry with no evidence of significant swelling, bruising, or active bleeding. Hgb and Cr are stable this morning after catheterization.   Review of systems complete and found to be negative unless listed above    Past Medical History:  Diagnosis Date   Diverticulitis    Glaucoma    High blood pressure    Osteoarthritis    knees   Osteoporosis 10/05/2015   Vitamin D  deficiency 09/18/2015    Past Surgical History:  Procedure Laterality Date   ABDOMINAL HYSTERECTOMY  2008   CATARACT EXTRACTION W/PHACO Right 02/27/2022   Procedure: Annell Kidney DUAL BLADE GONIOTOMY RIGHT;  Surgeon: Annell Kidney, MD;  Location: [facility];  Service: Ophthalmology;  Laterality: Right;   cataract surgery Bilateral 2011   CLOSED  REDUCTION WRIST FRACTURE Right 12/23/2020   Procedure: CLOSED REDUCTION AND PERCUTANEOUS PINNING RIGHT WRIST;  Surgeon: Marlynn Singer, MD;  Location: [facility] ORS;  Service: Orthopedics;  Laterality: Right;   HIP ARTHROPLASTY Right 12/23/2020   Procedure: ARTHROPLASTY BIPOLAR HIP (HEMIARTHROPLASTY);  Surgeon: Marlynn Singer, MD;  Location: [facility] ORS;  Service: Orthopedics;  Laterality: Right;   ORIF ELBOW FRACTURE Right 12/23/2020   Procedure: OPEN REDUCTION INTERNAL FIXATION (ORIF) ELBOW/OLECRANON FRACTURE;  Surgeon: Marlynn Singer, MD;  Location: [facility] ORS;  Service: Orthopedics;  Laterality: Right;   TONSILLECTOMY AND ADENOIDECTOMY  1941    Medications Prior to Admission  Medication Sig Dispense Refill Last Dose/Taking   diltiazem  (CARDIZEM ) 120 MG tablet Take 60 mg by mouth in the morning and at bedtime.   09/02/2023 Morning   furosemide (LASIX) 20 MG tablet Take 20 mg by mouth daily.   09/01/2023   Multiple Vitamins-Minerals (PRESERVISION AREDS) CAPS Take 1 capsule by mouth daily.   09/01/2023   vitamin B-12 (CYANOCOBALAMIN ) 1000 MCG tablet Take 1,000 mcg by mouth daily.   Past Week   Vitamin D , Cholecalciferol , 10 MCG (400 UNIT) TABS Take 400 mg by mouth in the morning and at bedtime.   09/01/2023 Morning   Magnesium  250 MG TABS Take 2 tablets by mouth daily. (Patient not taking: Reported on 09/02/2023)   Not Taking   VITAMIN A  PO Take 1 tablet by mouth daily. 10,000 units      Social History   Socioeconomic History   Marital status: Widowed    Spouse name: Not on file   Number of children: Not on file  Years of education: Not on file   Highest education level: Not on file  Occupational History   Not on file  Tobacco Use   Smoking status: Never   Smokeless tobacco: Never  Vaping Use   Vaping status: Never Used  Substance and Sexual Activity   Alcohol use: Yes    Alcohol/week: 7.0 standard drinks of alcohol    Types: 7 Glasses of wine per week   Drug use: No   Sexual activity: Not Currently   Other Topics Concern   Not on file  Social History Narrative   Mother of Honore Lux   Social Drivers of Health   Financial Resource Strain: Low Risk  (11/11/2022)   Overall Financial Resource Strain (CARDIA)    Difficulty of Paying Living Expenses: Not hard at all  Food Insecurity: No Food Insecurity (09/03/2023)   Hunger Vital Sign    Worried About Running Out of Food in the Last Year: Never true    Ran Out of Food in the Last Year: Never true  Transportation Needs: No Transportation Needs (09/03/2023)   PRAPARE - Administrator, Civil Service (Medical): No    Lack of Transportation (Non-Medical): No  Physical Activity: Insufficiently Active (11/11/2022)   Exercise Vital Sign    Days of Exercise per Week: 7 days    Minutes of Exercise per Session: 20 min  Stress: No Stress Concern Present (11/11/2022)   [employer] of Occupational Health - Occupational Stress Questionnaire    Feeling of Stress : Not at all  Social Connections: Socially Isolated (09/03/2023)   Social Connection and Isolation Panel [NHANES]    Frequency of Communication with Friends and Family: Three times a week    Frequency of Social Gatherings with Friends and Family: Three times a week    Attends Religious Services: Never    Active Member of Clubs or Organizations: No    Attends Banker Meetings: Never    Marital Status: Widowed  Intimate Partner Violence: Not At Risk (09/03/2023)   Humiliation, Afraid, Rape, and Kick questionnaire    Fear of Current or Ex-Partner: No    Emotionally Abused: No    Physically Abused: No    Sexually Abused: No    Family History  Problem Relation Age of Onset   Cancer Father        prostate   Colon cancer Neg Hx      Vitals:   09/04/23 2018 09/05/23 0001 09/05/23 0445 09/05/23 0848  BP: 125/65 114/72 113/64 120/71  Pulse: 97 (!) 106 75 91  Resp: 19 18 18 18   Temp: 98 F (36.7 C) 99.1 F (37.3 C) 98.5 F (36.9 C) 97.9 F (36.6 C)  TempSrc:       SpO2: 95% 98% 97% 100%  Weight:      Height:        PHYSICAL EXAM General: well appearing elderly female, well nourished, in no acute distress. HEENT: Normocephalic and atraumatic. Neck: No JVD.  Lungs: Normal respiratory effort on room air. Clear bilaterally to auscultation. No wheezes, crackles, rhonchi.  Heart: HRRR. Normal S1 and S2 without gallops or murmurs.  Abdomen: Non-distended appearing.  Msk: Normal strength and tone for age. Extremities: right radial incision site clean and dry with no evidence of significant swelling, bruising, or active bleeding. Warm and well perfused. No clubbing, cyanosis. No edema.  Neuro: Alert and oriented X 3. Psych: Answers questions appropriately.   Labs: Basic Metabolic Panel: Recent Labs  09/04/23 0550 09/05/23 0516  NA 137 136  K 3.7 3.6  CL 106 107  CO2 22 23  GLUCOSE 105* 96  BUN 16 17  CREATININE 0.56 0.65  CALCIUM  7.8* 7.9*  MG 1.8 1.8   Liver Function Tests: Recent Labs    09/02/23 1149  AST 29  ALT 20  ALKPHOS 50  BILITOT 0.8  PROT 4.3*  ALBUMIN 2.9*   No results for input(s): "LIPASE", "AMYLASE" in the last 72 hours. CBC: Recent Labs    09/04/23 0100 09/05/23 0516  WBC 7.2 7.0  HGB 9.4* 9.1*  HCT 28.4* 27.1*  MCV 94.4 94.1  PLT 147* 156   Cardiac Enzymes: Recent Labs    09/02/23 1828 09/02/23 2234 09/03/23 0943  TROPONINIHS 2,130* 2,674* 1,781*   BNP: No results for input(s): "BNP" in the last 72 hours. D-Dimer: No results for input(s): "DDIMER" in the last 72 hours. Hemoglobin A1C: No results for input(s): "HGBA1C" in the last 72 hours. Fasting Lipid Panel: Recent Labs    09/05/23 0516  CHOL 138  HDL 52  LDLCALC 73  TRIG 63  CHOLHDL 2.7   Thyroid  Function Tests: No results for input(s): "TSH", "T4TOTAL", "T3FREE", "THYROIDAB" in the last 72 hours.  Invalid input(s): "FREET3" Anemia Panel: No results for input(s): "VITAMINB12", "FOLATE", "FERRITIN", "TIBC", "IRON",  "RETICCTPCT" in the last 72 hours.   Radiology: ECHOCARDIOGRAM LIMITED Result Date: 09/03/2023    ECHOCARDIOGRAM LIMITED REPORT   Patient Name:   Anne Ferrell Date of Exam: 09/03/2023 Medical Rec #:  [id number]         Height:       69.0 in Accession #:    [id number]        Weight:       120.0 lb Date of Birth:  19-Feb-1933          BSA:          1.663 m Patient Age:    89 years          BP:           134/77 mmHg Patient Gender: F                 HR:           80 bpm. Exam Location:  [facility] Procedure: Limited Echo, Color Doppler, Cardiac Doppler and Intracardiac            Opacification Agent (Both Spectral and Color Flow Doppler were            utilized during procedure). Indications:     Shock R57.9  History:         Patient has prior history of Echocardiogram examinations, most                  recent 09/16/2017. High blodd pressure.  Sonographer:     Broadus Canes Referring Phys:  [id number] Avi Body Diagnosing Phys: Lida Reeks Alluri IMPRESSIONS  1. This is a limited echo.  2. Left ventricular ejection fraction, by estimation, is 35 to 40%. The left ventricle has moderately decreased function. The left ventricle demonstrates regional wall motion abnormalities (see scoring diagram/findings for description).  3. Right ventricular systolic function is normal. The right ventricular size is normal.  4. Moderate mitral valve regurgitation.  5. Systolic anterior motion of mitral leaflet with severe LVOT obstruction (peak gradient 220 mmHg). The aortic valve was not well visualized. FINDINGS  Left Ventricle: Left ventricular ejection fraction, by estimation, is 35 to 40%.  The left ventricle has moderately decreased function. The left ventricle demonstrates regional wall motion abnormalities. Definity  contrast agent was given IV to delineate the left ventricular endocardial borders. The left ventricular internal cavity size was normal in size.  LV Wall Scoring: The entire apex is akinetic. The mid anteroseptal segment, mid  inferolateral segment, mid anterolateral segment, mid inferoseptal segment, mid anterior segment, and mid inferior segment are hypokinetic. The basal anteroseptal segment, basal inferolateral segment, basal anterolateral segment, basal anterior segment, basal inferior segment, and basal inferoseptal segment are normal. Right Ventricle: The right ventricular size is normal. No increase in right ventricular wall thickness. Right ventricular systolic function is normal. Mitral Valve: There is mild thickening of the mitral valve leaflet(s). Moderate mitral valve regurgitation. Tricuspid Valve: The tricuspid valve is not well visualized. Tricuspid valve regurgitation is trivial. Aortic Valve: Systolic anterior motion of mitral leaflet with severe LVOT obstruction (peak gradient 220 mmHg). The aortic valve was not well visualized. Aortic valve mean gradient measures 39.3 mmHg. Aortic valve peak gradient measures 97.7 mmHg. Aortic  valve area, by VTI measures 5.88 cm. Additional Comments: Spectral Doppler performed. Color Doppler performed.  LEFT VENTRICLE PLAX 2D LVIDd:         4.30 cm LVIDs:         2.40 cm LV PW:         0.80 cm LV IVS:        1.00 cm LVOT diam:     2.00 cm LV SV:         380 LV SV Index:   229 LVOT Area:     3.14 cm  LV Volumes (MOD) LV vol d, MOD A2C: 63.4 ml LV vol d, MOD A4C: 96.7 ml LV vol s, MOD A2C: 50.0 ml LV vol s, MOD A4C: 63.4 ml LV SV MOD A2C:     13.4 ml LV SV MOD A4C:     96.7 ml LV SV MOD BP:      23.1 ml AORTIC VALVE AV Area (Vmax):    4.73 cm AV Area (Vmean):   6.10 cm AV Area (VTI):     5.88 cm AV Vmax:           494.33 cm/s AV Vmean:          263.667 cm/s AV VTI:            0.646 m AV Peak Grad:      97.7 mmHg AV Mean Grad:      39.3 mmHg LVOT Vmax:         744.00 cm/s LVOT Vmean:        512.000 cm/s LVOT VTI:          1.210 m LVOT/AV VTI ratio: 1.87 TRICUSPID VALVE TR Peak grad:   21.7 mmHg TR Vmax:        233.00 cm/s  SHUNTS Systemic VTI:  1.21 m Systemic Diam: 2.00 cm Joetta Mustache Electronically signed by Joetta Mustache Signature Date/Time: 09/03/2023/3:08:38 PM    Final     ECHO as above  TELEMETRY reviewed by me 09/05/2023: sinus rhythm, rates 70s  EKG reviewed by me: sinus rhythm with ST elevation inferior and anterior leads, rate 74 bpm  Data reviewed by me 09/05/2023: last 24h vitals tele labs imaging I/O ED provider note, admission H&P, vascular surgery notes  Principal Problem:   Hemorrhagic shock (HCC) Active Problems:   Essential hypertension   Varicose veins of right lower extremity with ulcer of ankle limited to  breakdown of skin (HCC)   Hemorrhage   Demand ischemia (HCC)   Blood loss anemia    ASSESSMENT AND PLAN:  Anne Ferrell is a 89 y.o. female  with a past medical history of hypertension, tachycardia, aortic sclerosis, congenital mitral valve prolapse, amaurosis fugax who presented to the ED on 09/02/2023 for unresponsiveness, significantly bleeding.  Was at home and had picked a scab on her right ankle, this bled significantly and she was found down by EMS. Brought to the ED and taken to the OR for ligation. Troponins found to be elevated. Cardiology was consulted for further evaluation.   # Hemorrhagic shock # Right LE ulcer, varicose veins # Stress Cardiomyopathy # Hypertension Patient reported to ED with bleeding on right ankle that required ligation. Patient without chest pain/pressure or SOB. Troponins elevated and trended 56 > 1133 > 2130 > 2674 > 1781.  Initial EKG in ED concern for ST elevation in inferior and anterior leads then 4 hours later the ST elevation became less prominent on repeat EKG. S/p blood transfusion upon admission. Echo this admission with EF 35 - 40%, with RWMA, moderate MR, SAM of mitral leaflet with severe LVOT obstruction (peak gradient 220 mg). LHC on 05/09 revealed depressed left ventricular function with anterior apical hypokinesis Takotsubo pattern EF around 35%, Left main and circumflex large free of disease,  LAD large with a 50% mid lesion hazy TIMI-3 flow, RCA large 25% mid lesion and intervention deferred. BP and HR stable this AM. Hgb and Cr stable today -Incision site at right radial is clean and dry with no evidence of significant swelling, bruising, or active bleeding. Hgb and Cr are stable this morning after catheterization. -Continue ASA and plavix  for 1 week then stop ASA and keep plavix  monotherapy. Further recommendations in outpatient follow up appointment with cardiology. -Continue Zetia  10 mg daily. Patient has history of intolerance to statin. -Start losartan 12.5 mg daily and metoprolol succinate 25 mg daily.  -Hold home PO diltiazem .  -Defer home PO lasix at this time. Patient appears euvolemic on exam.   Ok for discharge today from a cardiac perspective after patient ambulates. Will arrange for follow up in clinic with Dr. Beau Bound in 1-2 weeks.    This patient's plan of care was discussed and created with Dr. Bob Burn and he is in agreement.  Signed: Creighton Doffing, PA-C  09/05/2023, 10:04 AM [facility]

## 2023-09-06 LAB — LIPOPROTEIN A (LPA): Lipoprotein (a): 31.9 nmol/L — ABNORMAL HIGH (ref <75.0)

## 2023-09-07 DIAGNOSIS — I83899 Varicose veins of unspecified lower extremities with other complications: Secondary | ICD-10-CM | POA: Insufficient documentation

## 2023-09-07 LAB — CARDIAC CATHETERIZATION: Cath EF Quantitative: 45 %

## 2023-09-07 NOTE — Progress Notes (Unsigned)
 MRN : [id number]  Anne Ferrell is a 89 y.o. ([birth year]) female who presents with chief complaint of legs hurt and swell.  History of Present Illness:   Patient is seen for follow up evaluation of leg pain and swelling associated with venous ulceration. The patient was recently seen in the ER after a severe bleeding episode.  The patient did lose enough blood that she became hypotensive and was confirmed as having an MRI.  When I evaluated her in the emergency room I performed sclerotherapy in the surrounding area and placed a 6-0 Vicryl suture over the open wound.  She was wrapped and an Radio broadcast assistant.   The patient notes that an ulcer has developed acutely and that the bleeding ensued when a scab was removed.  No other specific trauma and since it occurred it has been very slow to heal.    The patient states that they have been elevating as much as possible. The patient denies any recent changes in medications.  The patient denies a history of DVT or PE. There is no prior history of phlebitis. There is no history of primary lymphedema.  No SOB or increased cough.  No sputum production.  No recent episodes of CHF exacerbation.  No outpatient medications have been marked as taking for the 09/08/23 encounter (Appointment) with Prescilla Brod, Ninette Basque, MD.    Past Medical History:  Diagnosis Date   Diverticulitis    Glaucoma    High blood pressure    Osteoarthritis    knees   Osteoporosis 10/05/2015   Vitamin D  deficiency 09/18/2015    Past Surgical History:  Procedure Laterality Date   ABDOMINAL HYSTERECTOMY  2008   CATARACT EXTRACTION W/PHACO Right 02/27/2022   Procedure: Annell Kidney DUAL BLADE GONIOTOMY RIGHT;  Surgeon: Annell Kidney, MD;  Location: [facility];  Service: Ophthalmology;  Laterality: Right;   cataract surgery Bilateral 2011   CLOSED REDUCTION WRIST FRACTURE Right 12/23/2020   Procedure: CLOSED REDUCTION AND PERCUTANEOUS PINNING RIGHT WRIST;   Surgeon: Marlynn Singer, MD;  Location: [facility];  Service: Orthopedics;  Laterality: Right;   HIP ARTHROPLASTY Right 12/23/2020   Procedure: ARTHROPLASTY BIPOLAR HIP (HEMIARTHROPLASTY);  Surgeon: Marlynn Singer, MD;  Location: [facility];  Service: Orthopedics;  Laterality: Right;   ORIF ELBOW FRACTURE Right 12/23/2020   Procedure: OPEN REDUCTION INTERNAL FIXATION (ORIF) ELBOW/OLECRANON FRACTURE;  Surgeon: Marlynn Singer, MD;  Location: [facility];  Service: Orthopedics;  Laterality: Right;   TONSILLECTOMY AND ADENOIDECTOMY  1941    Social History Social History   Tobacco Use   Smoking status: Never   Smokeless tobacco: Never  Vaping Use   Vaping status: Never Used  Substance Use Topics   Alcohol use: Yes    Alcohol/week: 7.0 standard drinks of alcohol    Types: 7 Glasses of wine per week   Drug use: No    Family History Family History  Problem Relation Age of Onset   Cancer Father        prostate   Colon cancer Neg Hx     Allergies  Allergen Reactions   Codeine     Double Vision   Percocet [Oxycodone -Acetaminophen ]     Occular Migraine   Timolol Other (See Comments)    Pt states her heart stopped.    Anise Flavoring Agent (Non-Screening) Palpitations and Other (See Comments)    Feels like she is going to pass out   Ciprofloxacin Palpitations   Levofloxacin Palpitations   Licorice  Flavor [Flavoring Agent (Non-Screening)] Palpitations     REVIEW OF SYSTEMS (Negative unless checked)  Constitutional: [] Weight loss  [] Fever  [] Chills Cardiac: [] Chest pain   [] Chest pressure   [] Palpitations   [] Shortness of breath when laying flat   [] Shortness of breath with exertion. Vascular:  [] Pain in legs with walking   [x] Pain in legs at rest  [] History of DVT   [] Phlebitis   [x] Swelling in legs   [] Varicose veins   [] Non-healing ulcers Pulmonary:   [] Uses home oxygen   [] Productive cough   [] Hemoptysis   [] Wheeze  [] COPD   [] Asthma Neurologic:  [] Dizziness   [] Seizures   [] History  of stroke   [] History of TIA  [] Aphasia   [] Vissual changes   [] Weakness or numbness in arm   [] Weakness or numbness in leg Musculoskeletal:   [] Joint swelling   [] Joint pain   [] Low back pain Hematologic:  [] Easy bruising  [] Easy bleeding   [] Hypercoagulable state   [] Anemic Gastrointestinal:  [] Diarrhea   [] Vomiting  [] Gastroesophageal reflux/heartburn   [] Difficulty swallowing. Genitourinary:  [] Chronic kidney disease   [] Difficult urination  [] Frequent urination   [] Blood in urine Skin:  [] Rashes   [] Ulcers  Psychological:  [] History of anxiety   []  History of major depression.  Physical Examination  There were no vitals filed for this visit. There is no height or weight on file to calculate BMI. Gen: WD/WN, NAD Head: [place]/AT, No temporalis wasting.  Ear/Nose/Throat: Hearing grossly intact, nares w/o erythema or drainage, pinna without lesions Eyes: PER, EOMI, sclera nonicteric.  Neck: Supple, no gross masses.  No JVD.  Pulmonary:  Good air movement, no audible wheezing, no use of accessory muscles.  Cardiac: RRR, precordium not hyperdynamic. Vascular:  scattered large varicosities present greater than 8 mm right leg much more affected than the left.  Mild venous stasis changes to the legs bilaterally.  2+ soft pitting edema. CEAP C4sEpAsPr   Vessel Right Left  Radial Palpable Palpable  Gastrointestinal: soft, non-distended. No guarding/no peritoneal signs.  Musculoskeletal: M/S 5/5 throughout.  No deformity.  Neurologic: CN 2-12 intact. Pain and light touch intact in extremities.  Symmetrical.  Speech is fluent. Motor exam as listed above. Psychiatric: Judgment intact, Mood & affect appropriate for pt's clinical situation. Dermatologic: Venous rashes no ulcers noted.  No changes consistent with cellulitis. Lymph : No lichenification or skin changes of chronic lymphedema.  CBC Lab Results  Component Value Date   WBC 7.0 09/05/2023   HGB 9.1 (L) 09/05/2023   HCT 27.1 (L) 09/05/2023    MCV 94.1 09/05/2023   PLT 156 09/05/2023    BMET    Component Value Date/Time   NA 136 09/05/2023 0516   K 3.6 09/05/2023 0516   CL 107 09/05/2023 0516   CO2 23 09/05/2023 0516   GLUCOSE 96 09/05/2023 0516   BUN 17 09/05/2023 0516   CREATININE 0.65 09/05/2023 0516   CALCIUM  7.9 (L) 09/05/2023 0516   GFRNONAA >60 09/05/2023 0516   GFRAA >60 02/24/2015 1836   Estimated Creatinine Clearance: 40.9 mL/min (by C-G formula based on SCr of 0.65 mg/dL).  COAG Lab Results  Component Value Date   INR 1.1 09/03/2023   INR 1.3 (H) 09/02/2023   INR 1.1 12/22/2020    Radiology CARDIAC CATHETERIZATION Result Date: 09/07/2023   Prox RCA to Mid RCA lesion is 50% stenosed.   Mid LAD lesion is 50% stenosed.   The left ventricular systolic function is normal.   LV end diastolic pressure  is normal.   The left ventricular ejection fraction is 45-50% by visual estimate.   There is no mitral valve regurgitation.   In the absence of any other complications or medical issues, we expect the patient to be ready for discharge from a cath perspective.   Recommend uninterrupted dual antiplatelet therapy with Aspirin  81mg  daily and Clopidogrel  75mg  daily for a minimum of 12 months (ACS-Class I recommendation). Conclusion Inpatient left heart cath with coronaries possible non-STEMI versus Takotsubo Mildly depressed left ventricular function with anterior apical hypokinesis EF around 45% Coronaries Left main large free of disease LAD large 50% mid lesion nonobstructive Circumflex large no significant disease RCA large 50% mid lesion nonobstructive Right dominant Intervention deferred Not indicated Recommend medical therapy Consider intervention if the patient fails medical therapy  US  Venous Img Lower Unilateral Right (DVT) Result Date: 09/05/2023 CLINICAL DATA:  Femoral DVT with thrombophlebitis. Pain. Varicose veins. EXAM: RIGHT LOWER EXTREMITY VENOUS DOPPLER ULTRASOUND TECHNIQUE: Gray-scale sonography with  compression, as well as color and duplex ultrasound, were performed to evaluate the deep venous system(s) from the level of the common femoral vein through the popliteal and proximal calf veins. COMPARISON:  None available FINDINGS: VENOUS Normal compressibility of the common femoral, superficial femoral, and popliteal veins, as well as the visualized calf veins. Visualized portions of profunda femoral vein and great saphenous vein unremarkable. No filling defects to suggest DVT on grayscale or color Doppler imaging. Doppler waveforms show normal direction of venous flow, normal respiratory plasticity and response to augmentation. Limited views of the contralateral common femoral vein are unremarkable. OTHER None. Limitations: none IMPRESSION: No right lower extremity DVT. Electronically Signed   By: Elester Grim M.D.   On: 09/05/2023 11:10   ECHOCARDIOGRAM LIMITED Result Date: 09/03/2023    ECHOCARDIOGRAM LIMITED REPORT   Patient Name:   Anne Ferrell Date of Exam: 09/03/2023 Medical Rec #:  [id number]         Height:       69.0 in Accession #:    [id number]        Weight:       120.0 lb Date of Birth:  [birth year]          BSA:          1.663 m Patient Age:    89 years          BP:           134/77 mmHg Patient Gender: F                 HR:           80 bpm. Exam Location:  [facility] Procedure: Limited Echo, Color Doppler, Cardiac Doppler and Intracardiac            Opacification Agent (Both Spectral and Color Flow Doppler were            utilized during procedure). Indications:     Shock R57.9  History:         Patient has prior history of Echocardiogram examinations, most                  recent 09/16/2017. High blodd pressure.  Sonographer:     Broadus Canes Referring Phys:  [id number] Avi Body Diagnosing Phys: Lida Reeks Alluri IMPRESSIONS  1. This is a limited echo.  2. Left ventricular ejection fraction, by estimation, is 35 to 40%. The left ventricle has moderately decreased function. The left ventricle  demonstrates regional wall motion abnormalities (see  scoring diagram/findings for description).  3. Right ventricular systolic function is normal. The right ventricular size is normal.  4. Moderate mitral valve regurgitation.  5. Systolic anterior motion of mitral leaflet with severe LVOT obstruction (peak gradient 220 mmHg). The aortic valve was not well visualized. FINDINGS  Left Ventricle: Left ventricular ejection fraction, by estimation, is 35 to 40%. The left ventricle has moderately decreased function. The left ventricle demonstrates regional wall motion abnormalities. Definity  contrast agent was given IV to delineate the left ventricular endocardial borders. The left ventricular internal cavity size was normal in size.  LV Wall Scoring: The entire apex is akinetic. The mid anteroseptal segment, mid inferolateral segment, mid anterolateral segment, mid inferoseptal segment, mid anterior segment, and mid inferior segment are hypokinetic. The basal anteroseptal segment, basal inferolateral segment, basal anterolateral segment, basal anterior segment, basal inferior segment, and basal inferoseptal segment are normal. Right Ventricle: The right ventricular size is normal. No increase in right ventricular wall thickness. Right ventricular systolic function is normal. Mitral Valve: There is mild thickening of the mitral valve leaflet(s). Moderate mitral valve regurgitation. Tricuspid Valve: The tricuspid valve is not well visualized. Tricuspid valve regurgitation is trivial. Aortic Valve: Systolic anterior motion of mitral leaflet with severe LVOT obstruction (peak gradient 220 mmHg). The aortic valve was not well visualized. Aortic valve mean gradient measures 39.3 mmHg. Aortic valve peak gradient measures 97.7 mmHg. Aortic  valve area, by VTI measures 5.88 cm. Additional Comments: Spectral Doppler performed. Color Doppler performed.  LEFT VENTRICLE PLAX 2D LVIDd:         4.30 cm LVIDs:         2.40 cm LV PW:          0.80 cm LV IVS:        1.00 cm LVOT diam:     2.00 cm LV SV:         380 LV SV Index:   229 LVOT Area:     3.14 cm  LV Volumes (MOD) LV vol d, MOD A2C: 63.4 ml LV vol d, MOD A4C: 96.7 ml LV vol s, MOD A2C: 50.0 ml LV vol s, MOD A4C: 63.4 ml LV SV MOD A2C:     13.4 ml LV SV MOD A4C:     96.7 ml LV SV MOD BP:      23.1 ml AORTIC VALVE AV Area (Vmax):    4.73 cm AV Area (Vmean):   6.10 cm AV Area (VTI):     5.88 cm AV Vmax:           494.33 cm/s AV Vmean:          263.667 cm/s AV VTI:            0.646 m AV Peak Grad:      97.7 mmHg AV Mean Grad:      39.3 mmHg LVOT Vmax:         744.00 cm/s LVOT Vmean:        512.000 cm/s LVOT VTI:          1.210 m LVOT/AV VTI ratio: 1.87 TRICUSPID VALVE TR Peak grad:   21.7 mmHg TR Vmax:        233.00 cm/s  SHUNTS Systemic VTI:  1.21 m Systemic Diam: 2.00 cm Joetta Mustache Electronically signed by Joetta Mustache Signature Date/Time: 09/03/2023/3:08:38 PM    Final      Assessment/Plan 1. Bleeding from varicose vein (Primary) Recommend  The patient's bleeding has been treated the cluster of  varicosities surrounding the ulcerated vein is hard to palpation consistent with successful sclerotherapy.  The area itself from which the bleeding ensued appears to have healed status post the figure-of-eight Vicryl suture.  The Unna boot has controlled the edema of the right leg quite nicely.  At this point we can stop Unna boots and she will begin using graduated compression on a daily basis.  She will elevate periodically.  She will return to the office at her convenience for a bilateral reflux study and further treatment of her venous issues will be based on the results of the scan. - VAS US  LOWER EXTREMITY VENOUS REFLUX; Future  2. Essential hypertension Continue antihypertensive medications as already ordered, these medications have been reviewed and there are no changes at this time.  3. Mixed hyperlipidemia Continue statin as ordered and reviewed, no changes at this  time    Devon Fogo, MD  09/07/2023 7:50 PM

## 2023-09-08 ENCOUNTER — Encounter: Payer: Self-pay | Admitting: Internal Medicine

## 2023-09-08 ENCOUNTER — Ambulatory Visit (INDEPENDENT_AMBULATORY_CARE_PROVIDER_SITE_OTHER): Payer: Self-pay | Admitting: Vascular Surgery

## 2023-09-08 ENCOUNTER — Telehealth: Payer: Self-pay

## 2023-09-08 VITALS — BP 131/71 | HR 87 | Resp 16 | Wt 124.6 lb

## 2023-09-08 DIAGNOSIS — I83899 Varicose veins of unspecified lower extremities with other complications: Secondary | ICD-10-CM

## 2023-09-08 DIAGNOSIS — I10 Essential (primary) hypertension: Secondary | ICD-10-CM | POA: Diagnosis not present

## 2023-09-08 DIAGNOSIS — E782 Mixed hyperlipidemia: Secondary | ICD-10-CM | POA: Diagnosis not present

## 2023-09-08 NOTE — Patient Instructions (Signed)
 Visit Information  Thank you for taking time to visit with me today. Please don't hesitate to contact me if I can be of assistance to you before our next scheduled telephone appointment.  Our next appointment is by telephone on May 21 at 10:00 AM  Following is a copy of your care plan:   Goals Addressed             This Visit's Progress    VBCI Transitions of Care (TOC) Care Plan       Problems:  Recent Hospitalization for treatment of CHF Medication management barrier Patient has medications daughter to follow up on Aspirin , states on After Visit Summary but her mother wrote not to take it.   Goal:  Over the next 30 days, the patient will not experience hospital readmission  Interventions:   Heart Failure Interventions: Discussed the importance of keeping all appointments with provider Advised patient to discuss Aspirin   with provider and to make PCP appointment within the next 7 -14 days.  Patient Self Care Activities:  Attend all scheduled provider appointments Call provider office for new concerns or questions  Participate in Transition of Care Program/Attend [facility] scheduled calls  Plan:  Telephone follow up appointment with care management team member scheduled for:  09/17/2023 10 AM        Patient verbalizes understanding of instructions and care plan provided today and agrees to view in MyChart. Active MyChart status and patient understanding of how to access instructions and care plan via MyChart confirmed with patient.     Telephone follow up appointment with care management team member scheduled for: The care management team will reach out to the patient again over the next 7-10  days.  Follow up with provider re: Aspirin  concerns  Please call the care guide team at [phone] if you need to cancel or reschedule your appointment.   Please call the [place]  National Suicide Prevention Lifeline: 484-730-7905 or TTY: [phone] TTY [phone])[phone]) to talk to a  trained counselor if you are experiencing a Mental Health or Behavioral Health Crisis or need someone to talk to.  Brown [place], RN, BSN, CCM [facility], [facility] RN Care Manager Direct Dial: [phone]

## 2023-09-08 NOTE — Transitions of Care (Post Inpatient/ED Visit) (Signed)
   09/08/2023  Name: Anne Ferrell MRN: [id number] DOB: 15-Oct-1932  Today's TOC FU Call Status: Today's TOC FU Call Status:: Successful TOC FU Call Completed TOC FU Call Complete Date: 09/08/23 Patient's Name and Date of Birth confirmed.  Transition Care Management Follow-up Telephone Call Date of Discharge: 09/05/23 Discharge Facility: [facility]) Type of Discharge: Inpatient Admission How have you been since you were released from the hospital?: Better Any questions or concerns?: Yes Patient Questions/Concerns:: Daughter expressed questions regarding Aspirin   Items Reviewed: Did you receive and understand the discharge instructions provided?: Yes (Daughter states written instructions with her mom) Medications obtained,verified, and reconciled?: Partial Review Completed Reason for Partial Mediation Review: Daughter was not with patient and AVS with patient Any new allergies since your discharge?: No  Medications Reviewed Today:  Patient with difficulty hearing and reviewed questions regarding Aspirin  with daughter, Oralee Billow. Medications Reviewed Today   Medications were not reviewed in this encounter      Goals Addressed             This Visit's Progress    VBCI Transitions of Care (TOC) Care Plan       Problems:  Recent Hospitalization for treatment of CHF Medication management barrier Patient has medications daughter to follow up on Aspirin , states on After Visit Summary but her mother wrote not to take it.   Goal:  Over the next 30 days, the patient will not experience hospital readmission  Interventions:   Heart Failure Interventions: Discussed the importance of keeping all appointments with provider Advised patient to discuss Aspirin   with provider and to make PCP appointment within the next 7 -14 days.  Patient Self Care Activities:  Attend all scheduled provider appointments Call provider office for new concerns or questions   Participate in Transition of Care Program/Attend [facility] scheduled calls  Plan:  Telephone follow up appointment with care management team member scheduled for:  09/17/2023 10 AM        PCP Follow-up appointment confirmed?: No (Offered but Daughter Oralee Billow is calling today to set up appointment) Specialist Hospital Follow-up appointment confirmed?: Yes Date of Specialist follow-up appointment?: 09/08/23 Follow-Up Specialty Provider:: Schnier, Ninette Basque, MD in [facility] Do you need transportation to your follow-up appointment?: No (Daughter taking to appointment) Do you understand care options if your condition(s) worsen?: Yes-patient verbalized understanding (Yes with daughter and reviewed AVS)   Brown [place], RN, BSN, CCM [place]  [facility], [facility] RN Care Manager Direct Dial: [phone]

## 2023-09-10 ENCOUNTER — Telehealth: Payer: Self-pay | Admitting: Family Medicine

## 2023-09-10 NOTE — Telephone Encounter (Signed)
 Copied from CRM [id number]. Topic: General - Other >> Sep 10, 2023 12:31 PM Clyde Darling P wrote: Reason for CRM: Angie from [facility] advise she saw pt and state she is independent and stable, pt does not need home health services at this time.

## 2023-09-12 ENCOUNTER — Telehealth (INDEPENDENT_AMBULATORY_CARE_PROVIDER_SITE_OTHER): Payer: Self-pay | Admitting: Vascular Surgery

## 2023-09-12 ENCOUNTER — Encounter (INDEPENDENT_AMBULATORY_CARE_PROVIDER_SITE_OTHER): Payer: Self-pay

## 2023-09-12 ENCOUNTER — Ambulatory Visit (INDEPENDENT_AMBULATORY_CARE_PROVIDER_SITE_OTHER): Admitting: Nurse Practitioner

## 2023-09-12 VITALS — BP 120/70 | HR 87 | Ht 69.0 in | Wt 124.0 lb

## 2023-09-12 DIAGNOSIS — I83891 Varicose veins of right lower extremities with other complications: Secondary | ICD-10-CM

## 2023-09-12 DIAGNOSIS — I83899 Varicose veins of unspecified lower extremities with other complications: Secondary | ICD-10-CM

## 2023-09-12 NOTE — Telephone Encounter (Signed)
 Bring her in and put her back on unna boots

## 2023-09-12 NOTE — Progress Notes (Signed)
 History of Present Illness  There is no documented history at this time  Assessments & Plan   There are no diagnoses linked to this encounter.    Additional instructions  Subjective:  Patient presents with venous ulcer of the Right lower extremity.    Procedure:  3 layer unna wrap was placed Right lower extremity.   Plan:   Follow up in one week.

## 2023-09-12 NOTE — Telephone Encounter (Signed)
 Pt daughter called and stated pt had blister before on pt right leg and GS closed it up. Pt daughter states pt has another blister on right leg right under where pt had sclero therapy and that its turning purple. Please advise. Call pt daughter Oralee Billow back at [phone].

## 2023-09-14 DIAGNOSIS — I5021 Acute systolic (congestive) heart failure: Secondary | ICD-10-CM | POA: Insufficient documentation

## 2023-09-14 DIAGNOSIS — I2510 Atherosclerotic heart disease of native coronary artery without angina pectoris: Secondary | ICD-10-CM

## 2023-09-14 HISTORY — DX: Atherosclerotic heart disease of native coronary artery without angina pectoris: I25.10

## 2023-09-14 NOTE — Progress Notes (Signed)
 Anne Ferrell T. [name], MD, CAQ Sports Medicine [facility] at [facility] [address] [place], [address]  Phone: [phone]  FAX: [phone]  Devany Ferrell - 89 y.o. female  MRN [id number]  Date of Birth: 1932-06-26  Date: 09/15/2023  PCP: Scherrie Curt, MD  Referral: Scherrie Curt, MD  Chief Complaint  Patient presents with   Hospitalization Follow-up   Subjective:   Anne Ferrell is a 89 y.o. very pleasant female patient with Body mass index is 17.52 kg/m. who presents with the following:  Admit date: 09/02/2023 Discharge date: 09/05/2023  The patient is normally in good health, however she presented to the hospital on Sep 02, 2023, unresponsive and in hemorrhagic shock.  She had a scab or wound on the right lower leg, and after picking the scab she had severe bleeding and became faint and short of breath and she lost consciousness.  She was in a large pool of blood and having agonal breathing.  Hemoglobin was 9.7 on presentation to the emergency room and she received 1 unit of packed red blood cells.  Vascular surgery was involved due to ruptured varicose vein and they did ligation and sclerotherapy.  She was also placed in an Radio broadcast assistant.  Troponin elevation due to demand ischemia and stress cardiomyopathy.  Coronary disease with 50% mid LAD lesion. Started on Toprol -XL 25 mg losartan  12.5 mg. Aspirin  and Plavix  for 1 week, then to Plavix . Follow-up Dr. Call in 1 to 2 weeks.  On echocardiogram, EF was 35 to 40%.  Cardiology favored stress cardiomyopathy. She was started on Toprol  as well as losartan .  She is currently in an Radio broadcast assistant, she is seeing vascular surgery have her a week right now for recheck.  She also has an upcoming ultrasound and follow-up appointment with vascular surgeon.  Review of Systems is noted in the HPI, as appropriate  Objective:   BP 120/66   Pulse 99   Temp 99.1 F (37.3 C) (Temporal)   Ht 5' 9.25"  (1.759 m)   Wt 119 lb 8 oz (54.2 kg)   SpO2 98%   BMI 17.52 kg/m   GEN: No acute distress; alert,appropriate. CV: RRR, no m/g/r  PULM: Normal respiratory rate, no accessory muscle use. No wheezes, crackles or rhonchi  PSYCH: Normally interactive.   Laboratory and Imaging Data:  Inpatient lab work and imaging reviewed  Assessment and Plan:     ICD-10-CM   1. Hemorrhagic shock (HCC)  R57.8     2. Coronary artery disease involving native coronary artery of native heart without angina pectoris  I25.10     3. Acute systolic congestive heart failure (HCC)  I50.21     4. Blood loss anemia  D50.0 CBC with Differential/Platelet    Hepatic function panel    5. Demand ischemia (HCC)  I24.89     6. Low serum albumin  R77.0 Hepatic function panel     The patient had a near life ending event with extensive bleeding and hemorrhagic shock.  She was transfused in the hospital, and at time of discharge she did have a hemoglobin of 9.  She still feels very weak, occasionally dizzy and lightheaded, and is not feeling her normal self yet.  She has not been able to start oral iron.  She had some concerns about potential dyes in the iron tablets.  Her family is going to assist finding some iron sulfate without any specific dyes that she is concerned about.  We will recheck her blood count.  She did have some protein calorie malnutrition during hospitalization, so we will recheck that.  Coronary disease, she is on beta-blocker, ACE inhibitor, Plavix .  No statin for now.  Acute heart failure.  Will need to follow to see if her EF recovers.  She has upcoming cardiology appointment, as well.  Orders placed today for conditions managed today: Orders Placed This Encounter  Procedures   CBC with Differential/Platelet   Hepatic function panel    Disposition: 2 months  Dr[employer] One speech-to-text software was used for transcription in this dictation.  Possible transcriptional errors can  occur using Animal nutritionist.   Signed,  Ranny Bye. Kinsler Soeder, MD   Outpatient Encounter Medications as of 09/15/2023  Medication Sig   clopidogrel  (PLAVIX ) 75 MG tablet Take 1 tablet (75 mg total) by mouth daily with breakfast.   ezetimibe  (ZETIA ) 10 MG tablet Take 1 tablet (10 mg total) by mouth daily.   losartan  (COZAAR ) 25 MG tablet Take 0.5 tablets (12.5 mg total) by mouth daily.   metoprolol  succinate (TOPROL -XL) 25 MG 24 hr tablet Take 1 tablet (25 mg total) by mouth daily.   Multiple Vitamins-Minerals (PRESERVISION AREDS) CAPS Take 1 capsule by mouth daily.   traMADol  (ULTRAM ) 50 MG tablet Take 1 tablet (50 mg total) by mouth every 6 (six) hours as needed for moderate pain (pain score 4-6) or severe pain (pain score 7-10).   VITAMIN A  PO Take 1 tablet by mouth daily. 10,000 units   vitamin B-12 (CYANOCOBALAMIN ) 1000 MCG tablet Take 1,000 mcg by mouth daily.   Vitamin D , Cholecalciferol , 10 MCG (400 UNIT) TABS Take 400 Units by mouth in the morning and at bedtime. Home med.   No facility-administered encounter medications on file as of 09/15/2023.

## 2023-09-15 ENCOUNTER — Encounter: Payer: Self-pay | Admitting: Family Medicine

## 2023-09-15 ENCOUNTER — Encounter (INDEPENDENT_AMBULATORY_CARE_PROVIDER_SITE_OTHER)

## 2023-09-15 ENCOUNTER — Ambulatory Visit: Admitting: Family Medicine

## 2023-09-15 VITALS — BP 120/66 | HR 99 | Temp 99.1°F | Ht 69.25 in | Wt 119.5 lb

## 2023-09-15 DIAGNOSIS — I2510 Atherosclerotic heart disease of native coronary artery without angina pectoris: Secondary | ICD-10-CM | POA: Diagnosis not present

## 2023-09-15 DIAGNOSIS — R578 Other shock: Secondary | ICD-10-CM

## 2023-09-15 DIAGNOSIS — R770 Abnormality of albumin: Secondary | ICD-10-CM | POA: Diagnosis not present

## 2023-09-15 DIAGNOSIS — D500 Iron deficiency anemia secondary to blood loss (chronic): Secondary | ICD-10-CM

## 2023-09-15 DIAGNOSIS — L97311 Non-pressure chronic ulcer of right ankle limited to breakdown of skin: Secondary | ICD-10-CM

## 2023-09-15 DIAGNOSIS — I5021 Acute systolic (congestive) heart failure: Secondary | ICD-10-CM | POA: Diagnosis not present

## 2023-09-15 DIAGNOSIS — I2489 Other forms of acute ischemic heart disease: Secondary | ICD-10-CM

## 2023-09-15 LAB — HEPATIC FUNCTION PANEL
ALT: 27 U/L (ref 0–35)
AST: 31 U/L (ref 0–37)
Albumin: 4.2 g/dL (ref 3.5–5.2)
Alkaline Phosphatase: 75 U/L (ref 39–117)
Bilirubin, Direct: 0.1 mg/dL (ref 0.0–0.3)
Total Bilirubin: 0.4 mg/dL (ref 0.2–1.2)
Total Protein: 6.4 g/dL (ref 6.0–8.3)

## 2023-09-15 LAB — CBC WITH DIFFERENTIAL/PLATELET
Basophils Absolute: 0.1 10*3/uL (ref 0.0–0.1)
Basophils Relative: 0.6 % (ref 0.0–3.0)
Eosinophils Absolute: 0 10*3/uL (ref 0.0–0.7)
Eosinophils Relative: 0.5 % (ref 0.0–5.0)
HCT: 31.4 % — ABNORMAL LOW (ref 36.0–46.0)
Hemoglobin: 10.6 g/dL — ABNORMAL LOW (ref 12.0–15.0)
Lymphocytes Relative: 4.1 % — ABNORMAL LOW (ref 12.0–46.0)
Lymphs Abs: 0.4 10*3/uL — ABNORMAL LOW (ref 0.7–4.0)
MCHC: 33.7 g/dL (ref 30.0–36.0)
MCV: 93.3 fl (ref 78.0–100.0)
Monocytes Absolute: 0.6 10*3/uL (ref 0.1–1.0)
Monocytes Relative: 6.6 % (ref 3.0–12.0)
Neutro Abs: 8.6 10*3/uL — ABNORMAL HIGH (ref 1.4–7.7)
Neutrophils Relative %: 88.2 % — ABNORMAL HIGH (ref 43.0–77.0)
Platelets: 360 10*3/uL (ref 150.0–400.0)
RBC: 3.37 Mil/uL — ABNORMAL LOW (ref 3.87–5.11)
RDW: 13.9 % (ref 11.5–15.5)
WBC: 9.7 10*3/uL (ref 4.0–10.5)

## 2023-09-15 NOTE — Patient Instructions (Signed)
 Iron Sulfate 325 mg

## 2023-09-16 ENCOUNTER — Ambulatory Visit: Payer: Self-pay | Admitting: Family Medicine

## 2023-09-16 ENCOUNTER — Telehealth: Payer: Self-pay | Admitting: Family Medicine

## 2023-09-16 ENCOUNTER — Encounter (INDEPENDENT_AMBULATORY_CARE_PROVIDER_SITE_OTHER): Payer: Self-pay

## 2023-09-16 NOTE — Telephone Encounter (Signed)
 Dr. Prescilla Brod,   I appreciate you taking care of Anne Ferrell when she had the massive bleed.  Your office is doing Unna boots now, and you are going to see her in the office in a few weeks.  Her daughter was concerned she could have an underlying condition that caused the lesion that bled.  She was specifically concerned about vaculitis, which I think would be uncommon.  Your notes seem to say that you thought she had a venous ulcer, which would be more common, and she has a follow-up ultrasound.  I can always check complement, ANCA, etc., but I wanted to send you a message to double-check and see if you were concerned about any systemic condition that could have caused the ulcer.   Thanks for your help.

## 2023-09-16 NOTE — Telephone Encounter (Signed)
Responded via a separate message

## 2023-09-17 ENCOUNTER — Telehealth: Payer: Self-pay

## 2023-09-17 NOTE — Patient Instructions (Signed)
 Visit Information  Thank you for taking time to visit with me today. Please don't hesitate to contact me if I can be of assistance to you before our next scheduled telephone appointment.  Our next appointment is by telephone on Sep 25, 2023 at 10:00 AM  Following is a copy of your care plan:   Goals Addressed   None     Patient verbalizes understanding of instructions and care plan provided today and agrees to view in MyChart. Active MyChart status and patient understanding of how to access instructions and care plan via MyChart confirmed with patient.     Telephone follow up appointment with care management team member scheduled for: The patient has been provided with contact information for the care management team and has been advised to call with any health related questions or concerns.   Please call the care guide team at [phone] if you need to cancel or reschedule your appointment.   Please call the [place]  National Suicide Prevention Lifeline: (607) 026-0633 or TTY: 903 241 9945 TTY [phone]) to talk to a trained counselor if you are experiencing a Mental Health or Behavioral Health Crisis or need someone to talk to.  Brown Cape, RN, BSN, CCM [facility], [facility] Health RN Care Manager Direct Dial: [phone]

## 2023-09-17 NOTE — Telephone Encounter (Signed)
 The patient's daughter contacted me a few more times.  I have ordered a vasculitis workup.

## 2023-09-17 NOTE — Transitions of Care (Post Inpatient/ED Visit) (Signed)
 Transition of Care week 2  Visit Note  09/17/2023  Name: Anne Ferrell MRN: [id number]          DOB: Jul 18, 1932  Situation: Patient enrolled in [facility] 30-day program. Visit completed with patient by telephone.   Background:   Initial Transition Care Management Follow-up Telephone Call Date of Discharge: 09/05/23 Discharge Facility: [facility]) Type of Discharge: Inpatient Admission How have you been since you were released from the hospital?: Better Patient Questions/Concerns:: No more issues  Past Medical History:  Diagnosis Date   Coronary artery disease involving native coronary artery of native heart without angina pectoris 09/14/2023   Diverticulitis    Glaucoma    High blood pressure    Osteoporosis 10/05/2015   Vitamin D  deficiency 09/18/2015    Assessment: Patient Reported Symptoms: Cognitive Cognitive Status: Alert and oriented to person, place, and time      Neurological Neurological Review of Symptoms: Dizziness (early mornings but whoozy at times)    HEENT HEENT Symptoms Reported: Change or loss of hearing      Cardiovascular Cardiovascular Symptoms Reported: No symptoms reported    Respiratory Respiratory Symptoms Reported: No symptoms reported    Endocrine Patient reports the following symptoms related to hypoglycemia or hyperglycemia : No symptoms reported    Gastrointestinal Gastrointestinal Symptoms Reported: No symptoms reported Additional Gastrointestinal Details: But taking Iron      Genitourinary Genitourinary Symptoms Reported: No symptoms reported    Integumentary Integumentary Symptoms Reported: Wound Additional Integumentary Details: Wound checked and changed    Musculoskeletal Musculoskelatal Symptoms Reviewed: No symptoms reported Additional Musculoskeletal Details: walking better        Psychosocial Psychosocial Symptoms Reported: No symptoms reported Additional Psychological Details: accepting what it  is         There were no vitals filed for this visit.  Medications Reviewed Today     Reviewed by Jamie Mccoy, RN (Registered Nurse) on 09/17/23 at 1038  Med List Status: <None>   Medication Order Taking? Sig Documenting Provider Last Dose Status Informant  clopidogrel  (PLAVIX ) 75 MG tablet [id number] Yes Take 1 tablet (75 mg total) by mouth daily with breakfast. Garrison Kanner, MD Taking Active            Med Note Jeb Miner, Costella Dirks Sep 15, 2023 11:33 AM) For 6 months per patient  ezetimibe  (ZETIA ) 10 MG tablet [id number] Yes Take 1 tablet (10 mg total) by mouth daily. Garrison Kanner, MD Taking Active   ferrous sulfate  324 MG TBEC [id number] Yes Take 324 mg by mouth. 1 daily [provider] Taking Active   losartan  (COZAAR ) 25 MG tablet [id number] Yes Take 0.5 tablets (12.5 mg total) by mouth daily. Garrison Kanner, MD Taking Active   metoprolol  succinate (TOPROL -XL) 25 MG 24 hr tablet [id number] Yes Take 1 tablet (25 mg total) by mouth daily. Garrison Kanner, MD Taking Active   Multiple Vitamins-Minerals (PRESERVISION AREDS) CAPS [id number] Yes Take 1 capsule by mouth daily. [provider] Taking Active Self, Pharmacy Records, Child  traMADol  (ULTRAM ) 50 MG tablet [id number] No Take 1 tablet (50 mg total) by mouth every 6 (six) hours as needed for moderate pain (pain score 4-6) or severe pain (pain score 7-10).  Patient not taking: Reported on 09/17/2023   Garrison Kanner, MD Not Taking Active            Med Note Miriam American, Aundra Lee Sep 17, 2023 10:07 AM) Have it but not needed  it  VITAMIN A  PO [id number] Yes Take 1 tablet by mouth daily. 10,000 units [provider] Taking Active Self, Pharmacy Records, Child           Med Note Jeb Miner, [place] Dec 25, 2022 11:12 AM)    vitamin B-12 (CYANOCOBALAMIN ) 1000 MCG tablet [id number] Yes Take 1,000 mcg by mouth daily. [provider] Taking Active Self, Pharmacy Records, Child  Vitamin D , Cholecalciferol , 10 MCG (400  UNIT) TABS [id number] Yes Take 400 Units by mouth in the morning and at bedtime. Home med. Garrison Kanner, MD Taking Active             Goals Addressed             This Visit's Progress    VBCI Transitions of Care [facility]) Care Plan   On track    Problems:  Recent Hospitalization for treatment of CHF Medication management barrier Patient reviewed medications list today. Confirmed not taking Aspirin  is on Plavix  alone. Patient is trying to gain weight but unable to take milk or dairy type supplements and would like information on plant based protein supplements Follow up appointments for leg wrap UNNA boots with vascular specialist weekly Fridays at 11:15 am Monitor weights daily encouraged  Goal:  Over the next 30 days, the patient will not experience hospital readmission  Interventions:   Heart Failure Interventions: Discussed the importance of keeping all appointments with provider Advised patient to discuss any changes in wooziness  with provider and to make PCP appointment. Safety Plan: use walker if feeling unsteady  Patient Self Care Activities:  Attend all scheduled provider appointments Call provider office for new concerns or questions  Participate in Transition of Care Program/Attend [facility] scheduled calls  Plan:   Telephone follow up appointment with care management team member scheduled for:  09/25/2023 10 AM Patient to see vascular specialist for unna boots weekly on Fridays and states she would call PCP if needs arises Call back for any concerns or questions or have daughter follow up        Brown Cape, RN, BSN, CCM [place]  [facility], [facility] Health RN Care Manager Direct Dial: [phone]

## 2023-09-19 ENCOUNTER — Ambulatory Visit (INDEPENDENT_AMBULATORY_CARE_PROVIDER_SITE_OTHER): Admitting: Nurse Practitioner

## 2023-09-19 ENCOUNTER — Encounter (INDEPENDENT_AMBULATORY_CARE_PROVIDER_SITE_OTHER): Payer: Self-pay

## 2023-09-19 VITALS — BP 123/76 | HR 88 | Resp 18

## 2023-09-19 DIAGNOSIS — I83891 Varicose veins of right lower extremities with other complications: Secondary | ICD-10-CM | POA: Diagnosis not present

## 2023-09-19 DIAGNOSIS — I83899 Varicose veins of unspecified lower extremities with other complications: Secondary | ICD-10-CM

## 2023-09-19 NOTE — Progress Notes (Unsigned)
 History of Present Illness  There is no documented history at this time  Assessments & Plan   There are no diagnoses linked to this encounter.    Additional instructions  Subjective:  Patient presents with venous ulcer of the Right lower extremity.    Procedure:  3 layer unna wrap was placed Right lower extremity.   Plan:   Follow up in one week.  Xeroform on right medial ankle

## 2023-09-22 ENCOUNTER — Encounter (INDEPENDENT_AMBULATORY_CARE_PROVIDER_SITE_OTHER): Payer: Self-pay | Admitting: Vascular Surgery

## 2023-09-23 ENCOUNTER — Telehealth (INDEPENDENT_AMBULATORY_CARE_PROVIDER_SITE_OTHER): Payer: Self-pay

## 2023-09-23 NOTE — Telephone Encounter (Signed)
 Patient daughter reach out through mychart and the patient choose to wait until her appointment to be rewrapped

## 2023-09-23 NOTE — Telephone Encounter (Signed)
 Patient will need to be schedule for unna wrap.

## 2023-09-24 ENCOUNTER — Other Ambulatory Visit (INDEPENDENT_AMBULATORY_CARE_PROVIDER_SITE_OTHER)

## 2023-09-24 DIAGNOSIS — R578 Other shock: Secondary | ICD-10-CM

## 2023-09-24 DIAGNOSIS — D500 Iron deficiency anemia secondary to blood loss (chronic): Secondary | ICD-10-CM | POA: Diagnosis not present

## 2023-09-24 DIAGNOSIS — I83013 Varicose veins of right lower extremity with ulcer of ankle: Secondary | ICD-10-CM | POA: Diagnosis not present

## 2023-09-24 DIAGNOSIS — L97311 Non-pressure chronic ulcer of right ankle limited to breakdown of skin: Secondary | ICD-10-CM | POA: Diagnosis not present

## 2023-09-24 NOTE — Addendum Note (Signed)
 Addended by: Gerry Krone on: 09/24/2023 04:19 PM   Modules accepted: Orders

## 2023-09-25 ENCOUNTER — Other Ambulatory Visit: Payer: Self-pay

## 2023-09-25 LAB — C-REACTIVE PROTEIN: CRP: <1 mg/dL (ref 0.5–20.0)

## 2023-09-25 LAB — BASIC METABOLIC PANEL WITH GFR
BUN: 27 mg/dL — ABNORMAL HIGH (ref 6–23)
CO2: 26 meq/L (ref 19–32)
Calcium: 9.3 mg/dL (ref 8.4–10.5)
Chloride: 103 meq/L (ref 96–112)
Creatinine, Ser: 0.86 mg/dL (ref 0.40–1.20)
GFR: 59.97 mL/min — ABNORMAL LOW (ref >60.00)
Glucose, Bld: 109 mg/dL — ABNORMAL HIGH (ref 70–99)
Potassium: 4.3 meq/L (ref 3.5–5.1)
Sodium: 137 meq/L (ref 135–145)

## 2023-09-25 LAB — SEDIMENTATION RATE: Sed Rate: 13 mm/h (ref 0–30)

## 2023-09-25 NOTE — Transitions of Care (Post Inpatient/ED Visit) (Signed)
 Transition of Care week 3  Visit Note  09/25/2023  Name: Anne Ferrell MRN: [id number]          DOB: [birth year]  Situation: Patient enrolled in [facility] 30-day program. Visit completed with patient by telephone.   Background:   Initial Transition Care Management Follow-up Telephone Call    Past Medical History:  Diagnosis Date   Coronary artery disease involving native coronary artery of native heart without angina pectoris 09/14/2023   Diverticulitis    Glaucoma    High blood pressure    Osteoporosis 10/05/2015   Vitamin D  deficiency 09/18/2015    Assessment: Patient Reported Symptoms: Cognitive Cognitive Status: Alert and oriented to person, place, and time      Neurological Neurological Review of Symptoms:  (Did speak of slight dizziness that goes away)    HEENT HEENT Symptoms Reported: No symptoms reported Other HEENT Symptoms/Conditions: Hard of hearing phone      Cardiovascular Cardiovascular Symptoms Reported: Dizziness (just slightly) Does patient have uncontrolled Hypertension?: Yes Patient's Recent BP reading at home: 122/75 (checked yesterday at home machine) Cardiovascular Conditions: Hypertension, Valvular disease, Heart failure Cardiovascular Management Strategies: Activity  Respiratory Respiratory Symptoms Reported: No symptoms reported    Endocrine Patient reports the following symptoms related to hypoglycemia or hyperglycemia : No symptoms reported Is patient diabetic?: No    Gastrointestinal Gastrointestinal Symptoms Reported: No symptoms reported Additional Gastrointestinal Details: No issues with iron      Genitourinary Genitourinary Symptoms Reported: No symptoms reported    Integumentary Integumentary Symptoms Reported: Wound    Musculoskeletal Musculoskelatal Symptoms Reviewed: No symptoms reported        Psychosocial Psychosocial Symptoms Reported: No symptoms reported (I feel really good and improving)         There were no vitals  filed for this visit.  Medications Reviewed Today     Reviewed by Jamie Mccoy, RN (Registered Nurse) on 09/25/23 at 1040  Med List Status: <None>   Medication Order Taking? Sig Documenting Provider Last Dose Status Informant  clopidogrel  (PLAVIX ) 75 MG tablet [id number] No Take 1 tablet (75 mg total) by mouth daily with breakfast. Garrison Kanner, MD Taking Active            Med Note Jeb Miner, [place] Sep 15, 2023 11:33 AM) For 6 months per patient  ezetimibe  (ZETIA ) 10 MG tablet [id number] No Take 1 tablet (10 mg total) by mouth daily. Garrison Kanner, MD Taking Active   ferrous sulfate  324 MG TBEC [id number] No Take 324 mg by mouth. 1 daily [provider] Taking Active   furosemide (LASIX) 20 MG tablet [id number] No Take 20 mg by mouth daily. [provider] Taking Active   losartan  (COZAAR ) 25 MG tablet [id number] No Take 0.5 tablets (12.5 mg total) by mouth daily. Garrison Kanner, MD Taking Active   metoprolol  succinate (TOPROL -XL) 25 MG 24 hr tablet [id number] No Take 1 tablet (25 mg total) by mouth daily. Garrison Kanner, MD Taking Active   Multiple Vitamins-Minerals (PRESERVISION AREDS) CAPS [id number] No Take 1 capsule by mouth daily. [provider] Taking Active Self, Pharmacy Records, Child  traMADol  (ULTRAM ) 50 MG tablet [id number] No Take 1 tablet (50 mg total) by mouth every 6 (six) hours as needed for moderate pain (pain score 4-6) or severe pain (pain score 7-10).  Patient not taking: Reported on 09/19/2023   Garrison Kanner, MD Not Taking Active            Med Note Miriam American,  Rowan Cooter   Wed Sep 17, 2023 10:07 AM) Have it but not needed it  VITAMIN A  PO [id number] No Take 1 tablet by mouth daily. 10,000 units [provider] Taking Active Self, Pharmacy Records, Child           Med Note Jeb Miner, [place] Dec 25, 2022 11:12 AM)    vitamin B-12 (CYANOCOBALAMIN ) 1000 MCG tablet [id number] No Take 1,000 mcg by mouth daily. [provider] Taking Active Self,  Pharmacy Records, Child  Vitamin D , Cholecalciferol , 10 MCG (400 UNIT) TABS [id number] No Take 400 Units by mouth in the morning and at bedtime. Home med. Garrison Kanner, MD Taking Active             Goals Addressed             This Visit's Progress    VBCI Transitions of Care [facility]) Care Plan       Problems:  Recent Hospitalization for treatment of CHF Medication management barrier Patient reviewed medications list today. Confirmed not taking Aspirin  is on Plavix  alone. Patient is trying to gain weight but unable to take milk or dairy type supplements and would like information on plant based protein supplements Follow up appointments for leg wrap UNNA boots with vascular specialist weekly Fridays at 11:15 am Monitor weights daily encouraged  Goal:  Over the next 30 days, the patient will not experience hospital readmission  Interventions:   Heart Failure Interventions: Discussed the importance of keeping all appointments with provider Advised patient to discuss any changes in wooziness  with provider and to make PCP appointment. Safety Plan: use walker if feeling unsteady  Patient Self Care Activities:  Attend all scheduled provider appointments Call provider office for new concerns or questions  Participate in Transition of Care Program/Attend TOC scheduled calls Continue to eat more protein based foods, more activity, will try to see if MD will let her drive soon  Plan:   Telephone follow up appointment with care management team member scheduled for:  10/07/2023 at 10:00 AM Patient to see vascular specialist for unna boots weekly on Fridays and states she would call PCP if needs arises Continue to try to gain weight, watching what she eats, bought protein and bought Owyn supplement with plant based protein and a hot chocolate with peanut butter and cocoa powder is helping Call back for any concerns or questions or have daughter follow up        Brown Cape, RN, BSN,  CCM [place]  [facility], [facility] Health RN Care Manager Direct Dial: [phone]

## 2023-09-25 NOTE — Addendum Note (Signed)
 Addended by: Gerry Krone on: 09/25/2023 03:01 PM   Modules accepted: Orders

## 2023-09-25 NOTE — Patient Instructions (Signed)
 Visit Information  Thank you for taking time to visit with me today. Please don't hesitate to contact me if I can be of assistance to you before our next scheduled telephone appointment.  Our next appointment is by telephone on October 07, 2023 at 10:00 AM  Following is a copy of your care plan:   Goals Addressed             This Visit's Progress    VBCI Transitions of Care (TOC) Care Plan       Problems:  Recent Hospitalization for treatment of CHF Medication management barrier Patient reviewed medications list today. Confirmed not taking Aspirin  is on Plavix  alone. Patient is trying to gain weight but unable to take milk or dairy type supplements and would like information on plant based protein supplements Follow up appointments for leg wrap UNNA boots with vascular specialist weekly Fridays at 11:15 am Monitor weights daily encouraged  Goal:  Over the next 30 days, the patient will not experience hospital readmission  Interventions:   Heart Failure Interventions: Discussed the importance of keeping all appointments with provider Advised patient to discuss any changes in wooziness  with provider and to make PCP appointment. Safety Plan: use walker if feeling unsteady  Patient Self Care Activities:  Attend all scheduled provider appointments Call provider office for new concerns or questions  Participate in Transition of Care Program/Attend TOC scheduled calls Continue to eat more protein based foods, more activity, will try to see if MD will let her drive soon  Plan:   Telephone follow up appointment with care management team member scheduled for:  10/07/2023 at 10:00 AM Patient to see vascular specialist for unna boots weekly on Fridays and states she would call PCP if needs arises Continue to try to gain weight, watching what she eats, bought protein and bought Owyn supplement with plant based protein and a hot chocolate with peanut butter and cocoa powder is helping Call  back for any concerns or questions or have daughter follow up        Patient verbalizes understanding of instructions and care plan provided today and agrees to view in MyChart. Active MyChart status and patient understanding of how to access instructions and care plan via MyChart confirmed with patient.     Telephone follow up appointment with care management team member scheduled for: The care management team will reach out to the patient again over the next 7 - 10 business days.  Follow up with provider re: driving again  Please call the care guide team at [phone] if you need to cancel or reschedule your appointment.   Please call the [place]  National Suicide Prevention Lifeline: (917)138-7674 or TTY: (769)031-0206 TTY 703-525-4134) to talk to a trained counselor if you are experiencing a Mental Health or Behavioral Health Crisis or need someone to talk to.  Brown Cape, RN, BSN, CCM [facility], [facility] Health RN Care Manager Direct Dial: [phone]

## 2023-09-26 ENCOUNTER — Ambulatory Visit (INDEPENDENT_AMBULATORY_CARE_PROVIDER_SITE_OTHER)

## 2023-09-26 ENCOUNTER — Encounter (INDEPENDENT_AMBULATORY_CARE_PROVIDER_SITE_OTHER): Payer: Self-pay

## 2023-09-26 VITALS — BP 128/73 | HR 94 | Resp 16

## 2023-09-26 DIAGNOSIS — I83899 Varicose veins of unspecified lower extremities with other complications: Secondary | ICD-10-CM

## 2023-09-26 DIAGNOSIS — I83891 Varicose veins of right lower extremities with other complications: Secondary | ICD-10-CM | POA: Diagnosis not present

## 2023-09-26 NOTE — Progress Notes (Signed)
 History of Present Illness  There is no documented history at this time  Assessments & Plan   There are no diagnoses linked to this encounter.    Additional instructions  Subjective:  Patient presents with venous ulcer of the Right lower extremity.    Procedure:  3 layer unna wrap was placed Right lower extremity.   Plan:   Follow up in one week.

## 2023-09-29 ENCOUNTER — Ambulatory Visit: Payer: Self-pay | Admitting: Family Medicine

## 2023-09-29 ENCOUNTER — Encounter: Payer: Self-pay | Admitting: Family Medicine

## 2023-10-03 ENCOUNTER — Ambulatory Visit (INDEPENDENT_AMBULATORY_CARE_PROVIDER_SITE_OTHER): Admitting: Nurse Practitioner

## 2023-10-03 ENCOUNTER — Encounter (INDEPENDENT_AMBULATORY_CARE_PROVIDER_SITE_OTHER): Payer: Self-pay

## 2023-10-03 VITALS — BP 132/81 | HR 78 | Resp 16

## 2023-10-03 DIAGNOSIS — I83899 Varicose veins of unspecified lower extremities with other complications: Secondary | ICD-10-CM

## 2023-10-03 DIAGNOSIS — I83891 Varicose veins of right lower extremities with other complications: Secondary | ICD-10-CM | POA: Diagnosis not present

## 2023-10-03 NOTE — Progress Notes (Signed)
 History of Present Illness  There is no documented history at this time  Assessments & Plan   There are no diagnoses linked to this encounter.    Additional instructions  Subjective:  Patient presents with venous ulcer of the Right lower extremity.    Procedure:  3 layer unna wrap was placed Right lower extremity.   Plan:   Follow up in one week.

## 2023-10-06 ENCOUNTER — Ambulatory Visit (INDEPENDENT_AMBULATORY_CARE_PROVIDER_SITE_OTHER)

## 2023-10-06 ENCOUNTER — Encounter (INDEPENDENT_AMBULATORY_CARE_PROVIDER_SITE_OTHER)

## 2023-10-06 ENCOUNTER — Ambulatory Visit (INDEPENDENT_AMBULATORY_CARE_PROVIDER_SITE_OTHER): Admitting: Vascular Surgery

## 2023-10-06 ENCOUNTER — Encounter (INDEPENDENT_AMBULATORY_CARE_PROVIDER_SITE_OTHER): Payer: Self-pay | Admitting: Vascular Surgery

## 2023-10-06 VITALS — BP 161/75 | HR 73 | Resp 18 | Ht 69.5 in | Wt 121.4 lb

## 2023-10-06 DIAGNOSIS — I10 Essential (primary) hypertension: Secondary | ICD-10-CM

## 2023-10-06 DIAGNOSIS — I83013 Varicose veins of right lower extremity with ulcer of ankle: Secondary | ICD-10-CM

## 2023-10-06 DIAGNOSIS — I83899 Varicose veins of unspecified lower extremities with other complications: Secondary | ICD-10-CM

## 2023-10-06 DIAGNOSIS — L97311 Non-pressure chronic ulcer of right ankle limited to breakdown of skin: Secondary | ICD-10-CM

## 2023-10-06 DIAGNOSIS — E782 Mixed hyperlipidemia: Secondary | ICD-10-CM

## 2023-10-06 LAB — C3 COMPLEMENT: C3 Complement: 105 mg/dL

## 2023-10-06 LAB — ANA SCREEN,IFA,REFLEX TITER/PATTERN,REFLEX MPLX 11 AB CASCADE
Anti Nuclear Antibody (ANA): POSITIVE — AB
Cyclic Citrullin Peptide Ab: <16 U
MUTATED CITRULLINATED VIMENTIN (MCV) AB: <20 U/mL (ref <20)
Rheumatoid fact SerPl-aCnc: <10 [IU]/mL (ref <14)

## 2023-10-06 LAB — ANTI-NUCLEAR AB-TITER (ANA TITER): ANA Titer 1: 1:40 {titer} — ABNORMAL HIGH

## 2023-10-06 LAB — TIER 3
Centromere Ab Screen: <1 AI
Ribosomal P Protein Ab: <1 AI

## 2023-10-06 LAB — TIER 2
Jo-1 Autoabs: <1 AI
SSA (Ro) (ENA) Antibody, IgG: <1 AI
SSB (La) (ENA) Antibody, IgG: <1 AI
Scleroderma (Scl-70) (ENA) Antibody, IgG: <1 AI

## 2023-10-06 LAB — TIER 1
Chromatin (Nucleosomal) Antibody: <1 AI
ENA SM Ab Ser-aCnc: <1 AI
Ribonucleic Protein(ENA) Antibody, IgG: <1 AI
SM/RNP: <1 AI
ds DNA Ab: <1 [IU]/mL

## 2023-10-06 LAB — HEPATITIS C ANTIBODY: Hepatitis C Ab: NONREACTIVE

## 2023-10-06 LAB — ANCA SCREEN W REFLEX TITER: ANCA SCREEN: NEGATIVE

## 2023-10-06 LAB — C4 COMPLEMENT: C4 Complement: 20 mg/dL

## 2023-10-06 LAB — INTERPRETATION

## 2023-10-06 LAB — HEPATITIS B CORE ANTIBODY, IGM: Hep B C IgM: NONREACTIVE

## 2023-10-06 LAB — HEPATITIS B SURFACE ANTIBODY,QUALITATIVE: Hep B S Ab: NONREACTIVE

## 2023-10-06 LAB — HEPATITIS B SURFACE ANTIGEN: Hepatitis B Surface Ag: NONREACTIVE

## 2023-10-07 ENCOUNTER — Other Ambulatory Visit: Payer: Self-pay

## 2023-10-07 VITALS — BP 120/70 | Wt 121.0 lb

## 2023-10-07 DIAGNOSIS — I509 Heart failure, unspecified: Secondary | ICD-10-CM

## 2023-10-08 ENCOUNTER — Encounter (INDEPENDENT_AMBULATORY_CARE_PROVIDER_SITE_OTHER): Payer: Self-pay | Admitting: Vascular Surgery

## 2023-10-08 NOTE — Progress Notes (Signed)
 MRN : [id number]  Anne Ferrell is a 89 y.o. (26-Feb-1933) female who presents with chief complaint of legs hurt and swell.  History of Present Illness:   Patient is seen for follow up evaluation of leg pain and swelling associated with venous ulceration. The patient was recently started on Unna boot therapy.  The initial event was bleeding from a varicosity.  The patient notes that in the morning the legs are better but the leg symptoms worsened throughout the course of the day. The patient has also noted a progressive worsening of the discoloration in the ankle and shin area.   The patient notes that the ulcer has developed acutely and it has been very slow to heal.  The wound is also somewhat painful.  The patient states that they have been elevating as much as possible. The patient denies any recent changes in medications.  The patient denies a history of DVT or PE. There is no prior history of phlebitis. There is no history of primary lymphedema.  No SOB or increased cough.  No sputum production.  No recent episodes of CHF exacerbation.  Current Meds  Medication Sig   clopidogrel  (PLAVIX ) 75 MG tablet Take 1 tablet (75 mg total) by mouth daily with breakfast.   ezetimibe  (ZETIA ) 10 MG tablet Take 1 tablet (10 mg total) by mouth daily.   ferrous sulfate  324 MG TBEC Take 324 mg by mouth. 1 daily   furosemide (LASIX) 20 MG tablet Take 20 mg by mouth daily.   losartan  (COZAAR ) 25 MG tablet Take 0.5 tablets (12.5 mg total) by mouth daily.   metoprolol  succinate (TOPROL -XL) 25 MG 24 hr tablet Take 1 tablet (25 mg total) by mouth daily.   Multiple Vitamins-Minerals (PRESERVISION AREDS) CAPS Take 1 capsule by mouth daily.   traMADol  (ULTRAM ) 50 MG tablet Take 1 tablet (50 mg total) by mouth every 6 (six) hours as needed for moderate pain (pain score 4-6) or severe pain (pain score 7-10).   VITAMIN A  PO Take 1 tablet by mouth daily. 10,000 units   vitamin B-12 (CYANOCOBALAMIN )  1000 MCG tablet Take 1,000 mcg by mouth daily.   Vitamin D , Cholecalciferol , 10 MCG (400 UNIT) TABS Take 400 Units by mouth in the morning and at bedtime. Home med.    Past Medical History:  Diagnosis Date   Coronary artery disease involving native coronary artery of native heart without angina pectoris 09/14/2023   Diverticulitis    Glaucoma    High blood pressure    Osteoporosis 10/05/2015   Vitamin D  deficiency 09/18/2015    Past Surgical History:  Procedure Laterality Date   ABDOMINAL HYSTERECTOMY  2008   CATARACT EXTRACTION W/PHACO Right 02/27/2022   Procedure: Annell Kidney DUAL BLADE GONIOTOMY RIGHT;  Surgeon: Annell Kidney, MD;  Location: [name];  Service: Ophthalmology;  Laterality: Right;   cataract surgery Bilateral 2011   CLOSED REDUCTION WRIST FRACTURE Right 12/23/2020   Procedure: CLOSED REDUCTION AND PERCUTANEOUS PINNING RIGHT WRIST;  Surgeon: Marlynn Singer, MD;  Location: [facility];  Service: Orthopedics;  Laterality: Right;   HIP ARTHROPLASTY Right 12/23/2020   Procedure: ARTHROPLASTY BIPOLAR HIP (HEMIARTHROPLASTY);  Surgeon: Marlynn Singer, MD;  Location: [facility];  Service: Orthopedics;  Laterality: Right;   LEFT HEART CATH AND CORONARY ANGIOGRAPHY N/A 09/04/2023   Procedure: LEFT HEART CATH AND CORONARY ANGIOGRAPHY;  Surgeon: Antonette Batters, MD;  Location: [facility];  Service: Cardiovascular;  Laterality: N/A;   ORIF  ELBOW FRACTURE Right 12/23/2020   Procedure: OPEN REDUCTION INTERNAL FIXATION (ORIF) ELBOW/OLECRANON FRACTURE;  Surgeon: Marlynn Singer, MD;  Location: [facility];  Service: Orthopedics;  Laterality: Right;   TONSILLECTOMY AND ADENOIDECTOMY  1941    Social History Social History   Tobacco Use   Smoking status: Never   Smokeless tobacco: Never  Vaping Use   Vaping status: Never Used  Substance Use Topics   Alcohol use: Yes    Alcohol/week: 7.0 standard drinks of alcohol    Types: 7 Glasses of wine per week   Drug use: No     Family History Family History  Problem Relation Age of Onset   Cancer Father        prostate   Colon cancer Neg Hx     Allergies  Allergen Reactions   Codeine     Double Vision   Oxycodone -Acetaminophen  Other (See Comments)    Occular Migraine  acetaminophen  / oxycodone    Timolol Other (See Comments)    Pt states her heart stopped.    Anise Flavoring Agent (Non-Screening) Palpitations and Other (See Comments)    Feels like she is going to pass out   Ciprofloxacin Palpitations   Levofloxacin Palpitations   Licorice Flavor [Flavoring Agent (Non-Screening)] Palpitations     REVIEW OF SYSTEMS (Negative unless checked)  Constitutional: [] Weight loss  [] Fever  [] Chills Cardiac: [] Chest pain   [] Chest pressure   [] Palpitations   [] Shortness of breath when laying flat   [] Shortness of breath with exertion. Vascular:  [] Pain in legs with walking   [x] Pain in legs at rest  [] History of DVT   [] Phlebitis   [x] Swelling in legs   [] Varicose veins   [] Non-healing ulcers Pulmonary:   [] Uses home oxygen   [] Productive cough   [] Hemoptysis   [] Wheeze  [] COPD   [] Asthma Neurologic:  [] Dizziness   [] Seizures   [] History of stroke   [] History of TIA  [] Aphasia   [] Vissual changes   [] Weakness or numbness in arm   [] Weakness or numbness in leg Musculoskeletal:   [] Joint swelling   [] Joint pain   [] Low back pain Hematologic:  [] Easy bruising  [] Easy bleeding   [] Hypercoagulable state   [] Anemic Gastrointestinal:  [] Diarrhea   [] Vomiting  [] Gastroesophageal reflux/heartburn   [] Difficulty swallowing. Genitourinary:  [] Chronic kidney disease   [] Difficult urination  [] Frequent urination   [] Blood in urine Skin:  [] Rashes   [] Ulcers  Psychological:  [] History of anxiety   []  History of major depression.  Physical Examination  Vitals:   10/06/23 1602  BP: (!) 161/75  Pulse: 73  Resp: 18  Weight: 121 lb 6.4 oz (55.1 kg)  Height: 5' 9.5 (1.765 m)   Body mass index is 17.67 kg/m. Gen:  WD/WN, NAD Head: [place]/AT, No temporalis wasting.  Ear/Nose/Throat: Hearing grossly intact, nares w/o erythema or drainage, pinna without lesions Eyes: PER, EOMI, sclera nonicteric.  Neck: Supple, no gross masses.  No JVD.  Pulmonary:  Good air movement, no audible wheezing, no use of accessory muscles.  Cardiac: RRR, precordium not hyperdynamic. Vascular: Multiple varicosities present bilaterally.  Moderate venous stasis changes to the legs bilaterally.  1+ soft pitting edema.  Right ankle below the level of the malleolus has a small venous ulcer approximately 5 mm in diameter CEAP [id number]   Vessel Right Left  Radial Palpable Palpable  Gastrointestinal: soft, non-distended. No guarding/no peritoneal signs.  Musculoskeletal: M/S 5/5 throughout.  No deformity.  Neurologic: CN 2-12 intact. Pain and light touch intact in  extremities.  Symmetrical.  Speech is fluent. Motor exam as listed above. Psychiatric: Judgment intact, Mood & affect appropriate for pt's clinical situation. Dermatologic: Venous rashes + rt ulcers noted.  No changes consistent with cellulitis. Lymph : No lichenification or skin changes of chronic lymphedema.  CBC Lab Results  Component Value Date   WBC 9.7 09/15/2023   HGB 10.6 (L) 09/15/2023   HCT 31.4 (L) 09/15/2023   MCV 93.3 09/15/2023   PLT 360.0 09/15/2023    BMET    Component Value Date/Time   NA 137 09/24/2023 1605   K 4.3 09/24/2023 1605   CL 103 09/24/2023 1605   CO2 26 09/24/2023 1605   GLUCOSE 109 (H) 09/24/2023 1605   BUN 27 (H) 09/24/2023 1605   CREATININE 0.86 09/24/2023 1605   CALCIUM  9.3 09/24/2023 1605   GFRNONAA >60 09/05/2023 0516   GFRAA >60 02/24/2015 1836   Estimated Creatinine Clearance: 38.6 mL/min (by C-G formula based on SCr of 0.86 mg/dL).  COAG Lab Results  Component Value Date   INR 1.1 09/03/2023   INR 1.3 (H) 09/02/2023   INR 1.1 12/22/2020    Radiology VAS US  LOWER EXTREMITY VENOUS REFLUX Result Date: 10/06/2023   Lower Venous Reflux Study Patient Name:  Aadvika Vitrano  Date of Exam:   10/06/2023 Medical Rec #: [id number]          Accession #:    [id number] Date of Birth: 12/28/1932           Patient Gender: F Patient Age:   73 years Exam Location:  [facility] Procedure:      VAS US  LOWER EXTREMITY VENOUS REFLUX Referring Phys: Devon Fogo --------------------------------------------------------------------------------  Indications: Varicosities.  Performing Technologist: Tonie Franks RVS  Examination Guidelines: A complete evaluation includes B-mode imaging, spectral Doppler, color Doppler, and power Doppler as needed of all accessible portions of each vessel. Bilateral testing is considered an integral part of a complete examination. Limited examinations for reoccurring indications may be performed as noted. The reflux portion of the exam is performed with the patient in reverse Trendelenburg. Significant venous reflux is defined as >500 ms in the superficial venous system, and >1 second in the deep venous system.  Venous Reflux Times +--------------+---------+------+-----------+------------+--------+ RIGHT         Reflux NoRefluxReflux TimeDiameter cmsComments                         Yes                                  +--------------+---------+------+-----------+------------+--------+ CFV           no                                             +--------------+---------+------+-----------+------------+--------+ FV prox       no                                             +--------------+---------+------+-----------+------------+--------+ FV mid        no                                             +--------------+---------+------+-----------+------------+--------+  FV dist       no                                             +--------------+---------+------+-----------+------------+--------+ Popliteal     no                                              +--------------+---------+------+-----------+------------+--------+ GSV at SFJ              yes    3499 ms      .61              +--------------+---------+------+-----------+------------+--------+ GSV prox thighno                            .38              +--------------+---------+------+-----------+------------+--------+ GSV mid thigh no                            .36              +--------------+---------+------+-----------+------------+--------+ GSV dist thighno                            .29              +--------------+---------+------+-----------+------------+--------+ GSV at knee   no                            .45              +--------------+---------+------+-----------+------------+--------+ GSV prox calf no                            .49              +--------------+---------+------+-----------+------------+--------+ SSV Pop Fossa no                            .25              +--------------+---------+------+-----------+------------+--------+  +--------------+---------+------+-----------+------------+--------+ LEFT          Reflux NoRefluxReflux TimeDiameter cmsComments                         Yes                                  +--------------+---------+------+-----------+------------+--------+ CFV           no                                             +--------------+---------+------+-----------+------------+--------+ FV prox       no                                             +--------------+---------+------+-----------+------------+--------+  FV mid        no                                             +--------------+---------+------+-----------+------------+--------+ FV dist       no                                             +--------------+---------+------+-----------+------------+--------+ Popliteal     no                                              +--------------+---------+------+-----------+------------+--------+ GSV at SFJ    no                            .63              +--------------+---------+------+-----------+------------+--------+ GSV prox thighno                            .42              +--------------+---------+------+-----------+------------+--------+ GSV mid thigh no                            .34              +--------------+---------+------+-----------+------------+--------+ GSV dist thighno                            .28              +--------------+---------+------+-----------+------------+--------+ GSV at knee   no                            .26              +--------------+---------+------+-----------+------------+--------+ GSV prox calf no                            .34              +--------------+---------+------+-----------+------------+--------+ SSV Pop Fossa no                            .23              +--------------+---------+------+-----------+------------+--------+   Summary: Bilateral: - No evidence of deep vein thrombosis seen in the lower extremities, bilaterally, from the common femoral through the popliteal veins. - No evidence of superficial venous thrombosis in the lower extremities, bilaterally. - No evidence of deep venous insufficiency seen bilaterally in the lower extremity. - No evidence of superficial venous reflux seen in the short saphenous veins bilaterally.  Right: - Venous reflux is noted in the right sapheno-femoral junction.  Left: - No evidence of superficial venous reflux seen in the left greater saphenous vein.  *See table(s) above for measurements and observations. Electronically signed by Devon Fogo MD on 10/06/2023 at 5:29:52 PM.  Final      Assessment/Plan 1. Varicose veins of right lower extremity with ulcer of ankle limited to breakdown of skin (HCC) (Primary) No surgery or intervention at this point in time.    I have had a long  discussion with the patient regarding venous insufficiency and why it  causes symptoms, specifically venous ulceration. I have discussed with the patient the chronic skin changes that accompany venous insufficiency and the long term sequela such as infection and recurring  ulceration.  Since her wound does not appear to be improving at the rate I would like using Unna boot therapy she will be switched to Medihoney with foam padding and a [employer] UlcerCare system.    In addition, behavioral modification including several periods of elevation of the lower extremities during the day will be continued. Achieving a position with the ankles at heart level was stressed to the patient  The patient is instructed to begin routine exercise, especially walking on a daily basis  In the future the patient can be assessed for graduated compression stockings or wraps as well as a Lymph Pump once the ulcers are healed.  2. Essential hypertension Continue antihypertensive medications as already ordered, these medications have been reviewed and there are no changes at this time.  3. Bleeding from varicose vein This has resolved she does not appear to have significant risk at this time for further bleeding  4. Mixed hyperlipidemia Continue statin as ordered and reviewed, no changes at this time    Devon Fogo, MD  10/08/2023 2:05 PM

## 2023-10-08 NOTE — Transitions of Care (Post Inpatient/ED Visit) (Signed)
 Transition of Care Week 4/5 Completed  Visit Note  10/08/2023  Name: Anne Ferrell MRN: [id number]          DOB: Jun 14, 1932  Situation: Patient enrolled in [facility] 30-day program. Visit completed with patient by telephone.   Background:   Initial Transition Care Management Follow-up Telephone Call    Past Medical History:  Diagnosis Date   Coronary artery disease involving native coronary artery of native heart without angina pectoris 09/14/2023   Diverticulitis    Glaucoma    High blood pressure    Osteoporosis 10/05/2015   Vitamin D  deficiency 09/18/2015    Assessment: Patient Reported Symptoms: Cognitive Cognitive Status: Alert and oriented to person, place, and time      Neurological Neurological Review of Symptoms: No symptoms reported    HEENT HEENT Symptoms Reported: No symptoms reported      Cardiovascular Cardiovascular Symptoms Reported: No symptoms reported Does patient have uncontrolled Hypertension?: Yes Patient's Recent BP reading at home: not taken yet today 120/70's Cardiovascular Conditions: Coronary artery disease, Hypertension, Valvular disease Cardiovascular Management Strategies: Activity, Adequate rest Weight: 121 lb (54.9 kg) Cardiovascular Self-Management Outcome: 4 (good)  Respiratory Respiratory Symptoms Reported: No symptoms reported    Endocrine Patient reports the following symptoms related to hypoglycemia or hyperglycemia : No symptoms reported Is patient diabetic?: No    Gastrointestinal Gastrointestinal Symptoms Reported: No symptoms reported      Genitourinary Genitourinary Symptoms Reported: No symptoms reported    Integumentary Integumentary Symptoms Reported: Wound Additional Integumentary Details: Starting self care Skin Conditions: Wound  Musculoskeletal Musculoskelatal Symptoms Reviewed: No symptoms reported        Psychosocial Psychosocial Symptoms Reported: No symptoms reported Additional Psychological Details:  Feeling good about progress         Vitals:   10/07/23 1005  BP: 120/70    Medications Reviewed Today     Reviewed by Jamie Mccoy, RN (Registered Nurse) on 10/07/23 at 1007  Med List Status: <None>   Medication Order Taking? Sig Documenting Provider Last Dose Status Informant  clopidogrel  (PLAVIX ) 75 MG tablet [id number] No Take 1 tablet (75 mg total) by mouth daily with breakfast. Garrison Kanner, MD Taking Active            Med Note Jeb Miner, Costella Dirks Sep 15, 2023 11:33 AM) For 6 months per patient  ezetimibe  (ZETIA ) 10 MG tablet [id number] No Take 1 tablet (10 mg total) by mouth daily. Garrison Kanner, MD Taking Active   ferrous sulfate  324 MG TBEC [id number] No Take 324 mg by mouth. 1 daily [provider] Taking Active   furosemide (LASIX) 20 MG tablet [id number] No Take 20 mg by mouth daily. [provider] Taking Active   losartan  (COZAAR ) 25 MG tablet [id number] No Take 0.5 tablets (12.5 mg total) by mouth daily. Garrison Kanner, MD Taking Active   metoprolol  succinate (TOPROL -XL) 25 MG 24 hr tablet [id number] No Take 1 tablet (25 mg total) by mouth daily. Garrison Kanner, MD Taking Active   Multiple Vitamins-Minerals (PRESERVISION AREDS) CAPS [id number] No Take 1 capsule by mouth daily. [provider] Taking Active Self, Pharmacy Records, Child  traMADol  (ULTRAM ) 50 MG tablet [id number] No Take 1 tablet (50 mg total) by mouth every 6 (six) hours as needed for moderate pain (pain score 4-6) or severe pain (pain score 7-10). Garrison Kanner, MD Taking Active            Med Note Miriam American, Aundra Lee Sep 17, 2023 10:07 AM) Have it but not needed it  VITAMIN A  PO [id number] No Take 1 tablet by mouth daily. 10,000 units [provider] Taking Active Self, Pharmacy Records, Child           Med Note Jeb Miner, [place] Dec 25, 2022 11:12 AM)    vitamin B-12 (CYANOCOBALAMIN ) 1000 MCG tablet [id number] No Take 1,000 mcg by mouth daily. [provider] Taking  Active Self, Pharmacy Records, Child  Vitamin D , Cholecalciferol , 10 MCG (400 UNIT) TABS [id number] No Take 400 Units by mouth in the morning and at bedtime. Home med. Garrison Kanner, MD Taking Active             We discussed care gaps for vaccines and patient states she has upcoming AWV on next month 11/12/23 scheduled.  Follow Up Plan:   Referral to RN Case Manager Closing From:  Transitions of Care Program  [place], RN, Scientist, research (physical sciences), CCM [employer] [facility]  [facility], [facility] RN Care Manager Direct Dial: [phone]

## 2023-10-10 ENCOUNTER — Encounter (INDEPENDENT_AMBULATORY_CARE_PROVIDER_SITE_OTHER)

## 2023-10-12 ENCOUNTER — Encounter (INDEPENDENT_AMBULATORY_CARE_PROVIDER_SITE_OTHER): Payer: Self-pay | Admitting: Nurse Practitioner

## 2023-10-17 ENCOUNTER — Ambulatory Visit (INDEPENDENT_AMBULATORY_CARE_PROVIDER_SITE_OTHER): Admitting: Nurse Practitioner

## 2023-10-23 ENCOUNTER — Telehealth: Payer: Self-pay

## 2023-10-23 NOTE — Progress Notes (Signed)
 Complex Care Management Note  Care Guide Note 10/23/2023 Name: Anne Ferrell MRN: [id number] DOB: 10/16/1932  Anne Ferrell is a 89 y.o. year old female who sees Copland, Jacques, MD for primary care. I reached out to Lauretta Karnik by phone today to offer complex care management services.  Anne Ferrell was given information about Complex Care Management services today including:   The Complex Care Management services include support from the care team which includes your Nurse Care Manager, Clinical Social Worker, or Pharmacist.  The Complex Care Management team is here to help remove barriers to the health concerns and goals most important to you. Complex Care Management services are voluntary, and the patient may decline or stop services at any time by request to their care team member.   Complex Care Management Consent Status: Patient did not agree to participate in complex care management services at this time.  Follow up plan:  Patient will follow up with specialist and PCP.  Encounter Outcome:  Patient Refused  Anne Ferrell [facility], [facility] Care Management Assistant Direct Dial: [phone]  Fax: [phone]

## 2023-11-06 ENCOUNTER — Encounter: Payer: Self-pay | Admitting: Family Medicine

## 2023-11-06 ENCOUNTER — Telehealth: Payer: Self-pay | Admitting: *Deleted

## 2023-11-06 NOTE — Telephone Encounter (Signed)
 Copied from CRM [id number]. Topic: Clinical - Request for Lab/Test Order >> Nov 06, 2023 10:05 AM Maisie BROCKS wrote: Reason for CRM: pt called to inquire about getting a lab test ordered to check her iron. Please advise.

## 2023-11-10 ENCOUNTER — Other Ambulatory Visit: Payer: Self-pay | Admitting: Family Medicine

## 2023-11-10 DIAGNOSIS — R7303 Prediabetes: Secondary | ICD-10-CM

## 2023-11-10 DIAGNOSIS — D500 Iron deficiency anemia secondary to blood loss (chronic): Secondary | ICD-10-CM

## 2023-11-10 NOTE — Telephone Encounter (Signed)
 I ordered a CBC, iron panel, and hemoglobin A1c - she can make a lab appointment to get them checked at any point.

## 2023-11-10 NOTE — Telephone Encounter (Signed)
 Spoke with Anne Ferrell and scheduled lab appointment 11/14/2023 at 9:00 am.

## 2023-11-13 ENCOUNTER — Encounter (INDEPENDENT_AMBULATORY_CARE_PROVIDER_SITE_OTHER): Payer: Self-pay | Admitting: Vascular Surgery

## 2023-11-13 ENCOUNTER — Ambulatory Visit (INDEPENDENT_AMBULATORY_CARE_PROVIDER_SITE_OTHER): Admitting: Vascular Surgery

## 2023-11-13 VITALS — BP 148/80 | HR 80 | Ht 69.5 in | Wt 121.5 lb

## 2023-11-13 DIAGNOSIS — I83013 Varicose veins of right lower extremity with ulcer of ankle: Secondary | ICD-10-CM

## 2023-11-13 DIAGNOSIS — I10 Essential (primary) hypertension: Secondary | ICD-10-CM

## 2023-11-13 DIAGNOSIS — I2510 Atherosclerotic heart disease of native coronary artery without angina pectoris: Secondary | ICD-10-CM

## 2023-11-13 DIAGNOSIS — E782 Mixed hyperlipidemia: Secondary | ICD-10-CM

## 2023-11-13 DIAGNOSIS — L97311 Non-pressure chronic ulcer of right ankle limited to breakdown of skin: Secondary | ICD-10-CM

## 2023-11-14 ENCOUNTER — Ambulatory Visit: Payer: Self-pay

## 2023-11-14 ENCOUNTER — Other Ambulatory Visit

## 2023-11-14 NOTE — Telephone Encounter (Signed)
 FYI Only or Action Required?: FYI only for provider.  Patient was last seen in primary care on 09/15/2023 by Watt Mirza, MD.  Called Nurse Triage reporting Palpitations.  Symptoms began today.  Interventions attempted: Rest, hydration, or home remedies.  Symptoms are: unchanged.  Triage Disposition: See HCP Within 4 Hours (Or PCP Triage)-recommended to the ED  Patient/caregiver understands and will follow disposition?: Yes  Copied from CRM [id number]. Topic: Clinical - Red Word Triage >> Nov 14, 2023  8:37 AM Robinson H wrote: [facility] that prompted transfer to Nurse Triage: Fast heartbeat Reason for Disposition  Age > 60 years  (Exception: Brief heartbeat symptoms that went away and now feels well.)  Answer Assessment - Initial Assessment Questions 1. DESCRIPTION: Please describe your heart rate or heartbeat that you are having (e.g., fast/slow, regular/irregular, skipped or extra beats, palpitations)     Fast-palpitations 2. ONSET: When did it start? (e.g., minutes, hours, days)      Started around 3:00 AM 3. DURATION: How long does it last (e.g., seconds, minutes, hours)     Hasn't gone away 4. PATTERN Does it come and go, or has it been constant since it started?  Does it get worse with exertion?   Are you feeling it now?     constant 5. TAP: Using your hand, can you tap out what you are feeling on a chair or table in front of you, so that I can hear? Note: Not all patients can do this.       99-130s-patient reports having a monitor to check her HR. 6. HEART RATE: Can you tell me your heart rate? How many beats in 15 seconds?  Note: Not all patients can do this.       117 7. RECURRENT SYMPTOM: Have you ever had this before? If Yes, ask: When was the last time? and What happened that time?      yes 8. CAUSE: What do you think is causing the palpitations?     unsure 9. CARDIAC HISTORY: Do you have any history of heart disease? (e.g., heart  attack, angina, bypass surgery, angioplasty, arrhythmia)      MI 10. OTHER SYMPTOMS: Do you have any other symptoms? (e.g., dizziness, chest pain, sweating, difficulty breathing)       No  Patient called to report palpitations that started around 0300. Patient states they haven't stopped. HR is running any where between 99-130s. Patient is recommended to the ED for evaluation. Patient to call her daughter to get her to the hospital.  Protocols used: Heart Rate and Heartbeat Questions-A-AH

## 2023-11-14 NOTE — Telephone Encounter (Signed)
 Agree.  Pulse in the 130's needs ER workup.

## 2023-11-15 ENCOUNTER — Encounter (INDEPENDENT_AMBULATORY_CARE_PROVIDER_SITE_OTHER): Payer: Self-pay | Admitting: Vascular Surgery

## 2023-11-15 NOTE — Progress Notes (Signed)
 MRN : [id number]  Anne Ferrell is a 89 y.o. (29-Nov-1932) female who presents with chief complaint of legs hurt and swell.  History of Present Illness:   Patient returns to the office for follow-up regarding her venous ulcer.  She denies pain.  She feels that the wound is very small almost gone.  No further bleeding episodes no fever or chills  Current Meds  Medication Sig   clopidogrel  (PLAVIX ) 75 MG tablet Take 1 tablet (75 mg total) by mouth daily with breakfast.   ezetimibe  (ZETIA ) 10 MG tablet Take 1 tablet (10 mg total) by mouth daily.   ferrous sulfate  324 MG TBEC Take 324 mg by mouth. 1 daily   furosemide (LASIX) 20 MG tablet Take 20 mg by mouth daily.   losartan  (COZAAR ) 25 MG tablet Take 0.5 tablets (12.5 mg total) by mouth daily.   metoprolol  succinate (TOPROL -XL) 25 MG 24 hr tablet Take 1 tablet (25 mg total) by mouth daily.   Multiple Vitamins-Minerals (PRESERVISION AREDS) CAPS Take 1 capsule by mouth daily.   VITAMIN A  PO Take 1 tablet by mouth daily. 10,000 units   vitamin B-12 (CYANOCOBALAMIN ) 1000 MCG tablet Take 1,000 mcg by mouth daily.   Vitamin D , Cholecalciferol , 10 MCG (400 UNIT) TABS Take 400 Units by mouth in the morning and at bedtime. Home med.    Past Medical History:  Diagnosis Date   Coronary artery disease involving native coronary artery of native heart without angina pectoris 09/14/2023   Diverticulitis    Glaucoma    High blood pressure    Osteoporosis 10/05/2015   Vitamin D  deficiency 09/18/2015    Past Surgical History:  Procedure Laterality Date   ABDOMINAL HYSTERECTOMY  2008   CATARACT EXTRACTION W/PHACO Right 02/27/2022   Procedure: KERMIT DUAL BLADE GONIOTOMY RIGHT;  Surgeon: Mittie Gaskin, MD;  Location: [facility];  Service: Ophthalmology;  Laterality: Right;   cataract surgery Bilateral 2011   CLOSED REDUCTION WRIST FRACTURE Right 12/23/2020   Procedure: CLOSED REDUCTION AND PERCUTANEOUS PINNING RIGHT WRIST;   Surgeon: Cleotilde Barrio, MD;  Location: [facility];  Service: Orthopedics;  Laterality: Right;   HIP ARTHROPLASTY Right 12/23/2020   Procedure: ARTHROPLASTY BIPOLAR HIP (HEMIARTHROPLASTY);  Surgeon: Cleotilde Barrio, MD;  Location: [facility];  Service: Orthopedics;  Laterality: Right;   LEFT HEART CATH AND CORONARY ANGIOGRAPHY N/A 09/04/2023   Procedure: LEFT HEART CATH AND CORONARY ANGIOGRAPHY;  Surgeon: Florencio Cara BIRCH, MD;  Location: [facility];  Service: Cardiovascular;  Laterality: N/A;   ORIF ELBOW FRACTURE Right 12/23/2020   Procedure: OPEN REDUCTION INTERNAL FIXATION (ORIF) ELBOW/OLECRANON FRACTURE;  Surgeon: Cleotilde Barrio, MD;  Location: [facility];  Service: Orthopedics;  Laterality: Right;   TONSILLECTOMY AND ADENOIDECTOMY  1941    Social History Social History   Tobacco Use   Smoking status: Never   Smokeless tobacco: Never  Vaping Use   Vaping status: Never Used  Substance Use Topics   Alcohol use: Yes    Alcohol/week: 7.0 standard drinks of alcohol    Types: 7 Glasses of wine per week   Drug use: No    Family History Family History  Problem Relation Age of Onset   Cancer Father        prostate   Colon cancer Neg Hx     Allergies  Allergen Reactions   Codeine     Double Vision   Oxycodone -Acetaminophen  Other (See Comments)    Occular Migraine  acetaminophen  /  oxycodone    Timolol Other (See Comments)    Pt states her heart stopped.    Anise Flavoring Agent (Non-Screening) Palpitations and Other (See Comments)    Feels like she is going to pass out   Ciprofloxacin Palpitations   Levofloxacin Palpitations   Licorice Flavor [Flavoring Agent (Non-Screening)] Palpitations     REVIEW OF SYSTEMS (Negative unless checked)  Constitutional: [] Weight loss  [] Fever  [] Chills Cardiac: [] Chest pain   [] Chest pressure   [] Palpitations   [] Shortness of breath when laying flat   [] Shortness of breath with exertion. Vascular:  [] Pain in legs with walking   [x] Pain  in legs at rest  [] History of DVT   [] Phlebitis   [x] Swelling in legs   [] Varicose veins   [] Non-healing ulcers Pulmonary:   [] Uses home oxygen   [] Productive cough   [] Hemoptysis   [] Wheeze  [] COPD   [] Asthma Neurologic:  [] Dizziness   [] Seizures   [] History of stroke   [] History of TIA  [] Aphasia   [] Vissual changes   [] Weakness or numbness in arm   [] Weakness or numbness in leg Musculoskeletal:   [] Joint swelling   [] Joint pain   [] Low back pain Hematologic:  [] Easy bruising  [] Easy bleeding   [] Hypercoagulable state   [] Anemic Gastrointestinal:  [] Diarrhea   [] Vomiting  [] Gastroesophageal reflux/heartburn   [] Difficulty swallowing. Genitourinary:  [] Chronic kidney disease   [] Difficult urination  [] Frequent urination   [] Blood in urine Skin:  [] Rashes   [] Ulcers  Psychological:  [] History of anxiety   []  History of major depression.  Physical Examination  Vitals:   11/13/23 1621  BP: (!) 148/80  Pulse: 80  Weight: 121 lb 8 oz (55.1 kg)  Height: 5' 9.5 (1.765 m)   Body mass index is 17.69 kg/m. Gen: WD/WN, NAD Head: [place]/AT, No temporalis wasting.  Ear/Nose/Throat: Hearing grossly intact, nares w/o erythema or drainage, pinna without lesions Eyes: PER, EOMI, sclera nonicteric.  Neck: Supple, no gross masses.  No JVD.  Pulmonary:  Good air movement, no audible wheezing, no use of accessory muscles.  Cardiac: RRR, precordium not hyperdynamic. Vascular:  scattered varicosities present bilaterally.  Moderate venous stasis changes to the legs bilaterally.  The right ankle ulcer is essentially healed.  Trace soft pitting edema. CEAP [id number]   Vessel Right Left  Radial Palpable Palpable  Gastrointestinal: soft, non-distended. No guarding/no peritoneal signs.  Musculoskeletal: M/S 5/5 throughout.  No deformity.  Neurologic: CN 2-12 intact. Pain and light touch intact in extremities.  Symmetrical.  Speech is fluent. Motor exam as listed above. Psychiatric: Judgment intact, Mood & affect  appropriate for pt's clinical situation. Dermatologic: Venous rashes no ulcers noted.  No changes consistent with cellulitis. Lymph : No lichenification or skin changes of chronic lymphedema.  CBC Lab Results  Component Value Date   WBC 9.7 09/15/2023   HGB 10.6 (L) 09/15/2023   HCT 31.4 (L) 09/15/2023   MCV 93.3 09/15/2023   PLT 360.0 09/15/2023    BMET    Component Value Date/Time   NA 137 09/24/2023 1605   K 4.3 09/24/2023 1605   CL 103 09/24/2023 1605   CO2 26 09/24/2023 1605   GLUCOSE 109 (H) 09/24/2023 1605   BUN 27 (H) 09/24/2023 1605   CREATININE 0.86 09/24/2023 1605   CALCIUM  9.3 09/24/2023 1605   GFRNONAA >60 09/05/2023 0516   GFRAA >60 02/24/2015 1836   CrCl cannot be calculated (Patient's most recent lab result is older than the maximum 21 days allowed.).  COAG Lab  Results  Component Value Date   INR 1.1 09/03/2023   INR 1.3 (H) 09/02/2023   INR 1.1 12/22/2020    Radiology No results found.   Assessment/Plan 1. Varicose veins of right lower extremity with ulcer of ankle limited to breakdown of skin (HCC) (Primary) Her wound is essentially healed.  She can discontinue the Medihoney.  She will continue using graduated compression stockings on a daily basis.  I will see her back in 2 months at which time if there are no further issues she will follow-up with me as needed  2. Essential hypertension Continue antihypertensive medications as already ordered, these medications have been reviewed and there are no changes at this time.  3. Coronary artery disease involving native coronary artery of native heart without angina pectoris Continue cardiac and antihypertensive medications as already ordered and reviewed, no changes at this time.  Continue statin as ordered and reviewed, no changes at this time  Nitrates PRN for chest pain  4. Mixed hyperlipidemia Continue statin as ordered and reviewed, no changes at this time    Cordella Shawl,  MD  11/15/2023 11:48 AM

## 2023-11-17 ENCOUNTER — Ambulatory Visit (INDEPENDENT_AMBULATORY_CARE_PROVIDER_SITE_OTHER)

## 2023-11-17 VITALS — BP 148/80 | Ht 69.5 in | Wt 120.0 lb

## 2023-11-17 DIAGNOSIS — Z0000 Encounter for general adult medical examination without abnormal findings: Secondary | ICD-10-CM | POA: Diagnosis not present

## 2023-11-17 DIAGNOSIS — Z2821 Immunization not carried out because of patient refusal: Secondary | ICD-10-CM

## 2023-11-17 NOTE — Progress Notes (Signed)
 Because this visit was a virtual/telehealth visit,  certain criteria was not obtained, such a blood pressure, CBG if applicable, and timed get up and go. Any medications not marked as taking were not mentioned during the medication reconciliation part of the visit. Any vitals not documented were not able to be obtained due to this being a telehealth visit or patient was unable to self-report a recent blood pressure reading due to a lack of equipment at home via telehealth. Vitals that have been documented are verbally provided by the patient.  This visit was performed by a medical professional under my direct supervision. I was immediately available for consultation/collaboration. I have reviewed and agree with the Annual Wellness Visit documentation.  Subjective:   Anne Ferrell is a 89 y.o. who presents for a Medicare Wellness preventive visit.  As a reminder, Annual Wellness Visits don't include a physical exam, and some assessments may be limited, especially if this visit is performed virtually. We may recommend an in-person follow-up visit with your provider if needed.  Visit Complete: Virtual I connected with  Coralynn Remus on 11/17/23 by a audio enabled telemedicine application and verified that I am speaking with the correct person using two identifiers.  Patient Location: Home  Provider Location: Home Office  I discussed the limitations of evaluation and management by telemedicine. The patient expressed understanding and agreed to proceed.  Vital Signs: Because this visit was a virtual/telehealth visit, some criteria may be missing or patient reported. Any vitals not documented were not able to be obtained and vitals that have been documented are patient reported.  VideoDeclined- This patient declined Librarian, academic. Therefore the visit was completed with audio only.  Persons Participating in Visit: Patient.  AWV Questionnaire: Yes: Patient  Medicare AWV questionnaire was completed by the patient on 11/10/2023; I have confirmed that all information answered by patient is correct and no changes since this date.  Cardiac Risk Factors include: hypertension;advanced age (>65men, >62 women);dyslipidemia     Objective:    Today's Vitals   11/17/23 0859  BP: (!) 148/80  Weight: 120 lb (54.4 kg)  Height: 5' 9.5 (1.765 m)   Body mass index is 17.47 kg/m.     11/17/2023    8:58 AM 09/03/2023   12:50 PM 09/02/2023   11:46 AM 11/11/2022    8:52 AM 02/27/2022   10:14 AM 11/06/2021    9:10 AM 12/22/2020    9:44 AM  Advanced Directives  Does Patient Have a Medical Advance Directive? Yes Yes No Yes Yes No No;Yes  Type of Estate agent of [place];Living will Healthcare Power of [place];Living will  Healthcare Power of [place];Living will Healthcare Power of [place];Living will  Healthcare Power of Attorney  Does patient want to make changes to medical advance directive? No - Patient declined No - Patient declined   No - Patient declined  No - Patient declined  Copy of Healthcare Power of Attorney in Chart? Yes - validated most recent copy scanned in chart (See row information) No - copy requested  No - copy requested Yes - validated most recent copy scanned in chart (See row information)  No - copy requested  Would patient like information on creating a medical advance directive?      No - Patient declined No - Patient declined    Current Medications (verified) Outpatient Encounter Medications as of 11/17/2023  Medication Sig   clopidogrel  (PLAVIX ) 75 MG tablet Take 1 tablet (75 mg total) by  mouth daily with breakfast.   ezetimibe  (ZETIA ) 10 MG tablet Take 1 tablet (10 mg total) by mouth daily.   ferrous sulfate  324 MG TBEC Take 324 mg by mouth. 1 daily   furosemide (LASIX) 20 MG tablet Take 20 mg by mouth daily.   losartan  (COZAAR ) 25 MG tablet Take 0.5 tablets (12.5 mg total) by mouth daily.   metoprolol   succinate (TOPROL -XL) 25 MG 24 hr tablet Take 1 tablet (25 mg total) by mouth daily.   Multiple Vitamins-Minerals (PRESERVISION AREDS) CAPS Take 1 capsule by mouth daily.   VITAMIN A  PO Take 1 tablet by mouth daily. 10,000 units   vitamin B-12 (CYANOCOBALAMIN ) 1000 MCG tablet Take 1,000 mcg by mouth daily.   Vitamin D , Cholecalciferol , 10 MCG (400 UNIT) TABS Take 400 Units by mouth in the morning and at bedtime. Home med.   traMADol  (ULTRAM ) 50 MG tablet Take 1 tablet (50 mg total) by mouth every 6 (six) hours as needed for moderate pain (pain score 4-6) or severe pain (pain score 7-10).   No facility-administered encounter medications on file as of 11/17/2023.    Allergies (verified) Codeine, Oxycodone -acetaminophen , Timolol, Anise flavoring agent (non-screening), Ciprofloxacin, Levofloxacin, and Licorice flavor [flavoring agent (non-screening)]   History: Past Medical History:  Diagnosis Date   Coronary artery disease involving native coronary artery of native heart without angina pectoris 09/14/2023   Diverticulitis    Glaucoma    High blood pressure    Osteoporosis 10/05/2015   Vitamin D  deficiency 09/18/2015   Past Surgical History:  Procedure Laterality Date   ABDOMINAL HYSTERECTOMY  2008   CATARACT EXTRACTION W/PHACO Right 02/27/2022   Procedure: KERMIT DUAL BLADE GONIOTOMY RIGHT;  Surgeon: Mittie Gaskin, MD;  Location: [facility];  Service: Ophthalmology;  Laterality: Right;   cataract surgery Bilateral 2011   CLOSED REDUCTION WRIST FRACTURE Right 12/23/2020   Procedure: CLOSED REDUCTION AND PERCUTANEOUS PINNING RIGHT WRIST;  Surgeon: Cleotilde Barrio, MD;  Location: [facility];  Service: Orthopedics;  Laterality: Right;   HIP ARTHROPLASTY Right 12/23/2020   Procedure: ARTHROPLASTY BIPOLAR HIP (HEMIARTHROPLASTY);  Surgeon: Cleotilde Barrio, MD;  Location: [facility];  Service: Orthopedics;  Laterality: Right;   LEFT HEART CATH AND CORONARY ANGIOGRAPHY N/A 09/04/2023    Procedure: LEFT HEART CATH AND CORONARY ANGIOGRAPHY;  Surgeon: Florencio Cara BIRCH, MD;  Location: [facility];  Service: Cardiovascular;  Laterality: N/A;   ORIF ELBOW FRACTURE Right 12/23/2020   Procedure: OPEN REDUCTION INTERNAL FIXATION (ORIF) ELBOW/OLECRANON FRACTURE;  Surgeon: Cleotilde Barrio, MD;  Location: [facility];  Service: Orthopedics;  Laterality: Right;   TONSILLECTOMY AND ADENOIDECTOMY  1941   Family History  Problem Relation Age of Onset   Cancer Father        prostate   Colon cancer Neg Hx    Social History   Socioeconomic History   Marital status: Widowed    Spouse name: Not on file   Number of children: Not on file   Years of education: Not on file   Highest education level: 12th grade  Occupational History   Not on file  Tobacco Use   Smoking status: Never   Smokeless tobacco: Never  Vaping Use   Vaping status: Never Used  Substance and Sexual Activity   Alcohol use: Yes    Alcohol/week: 7.0 standard drinks of alcohol    Types: 7 Glasses of wine per week   Drug use: No   Sexual activity: Not Currently  Other Topics Concern   Not on  file  Social History Narrative   Mother of Sharlet Au   Social Drivers of Health   Financial Resource Strain: Low Risk  (11/10/2023)   Overall Financial Resource Strain (CARDIA)    Difficulty of Paying Living Expenses: Not hard at all  Food Insecurity: No Food Insecurity (11/10/2023)   Hunger Vital Sign    Worried About Running Out of Food in the Last Year: Never true    Ran Out of Food in the Last Year: Never true  Transportation Needs: No Transportation Needs (11/10/2023)   PRAPARE - Administrator, Civil Service (Medical): No    Lack of Transportation (Non-Medical): No  Physical Activity: Insufficiently Active (11/10/2023)   Exercise Vital Sign    Days of Exercise per Week: 2 days    Minutes of Exercise per Session: 10 min  Stress: No Stress Concern Present (11/10/2023)   [employer] of  Occupational Health - Occupational Stress Questionnaire    Feeling of Stress: Not at all  Social Connections: Socially Isolated (11/10/2023)   Social Connection and Isolation Panel    Frequency of Communication with Friends and Family: More than three times a week    Frequency of Social Gatherings with Friends and Family: Three times a week    Attends Religious Services: Never    Active Member of Clubs or Organizations: No    Attends Banker Meetings: Not on file    Marital Status: Widowed    Tobacco Counseling Counseling given: Not Answered    Clinical Intake:  Pre-visit preparation completed: Yes  Pain : No/denies pain     BMI - recorded: 17.47 Nutritional Status: BMI <19  Underweight Nutritional Risks: None Diabetes: No  Lab Results  Component Value Date   HGBA1C 5.7 12/18/2022   HGBA1C 6.0 11/15/2021   HGBA1C 5.9 11/06/2020     How often do you need to have someone help you when you read instructions, pamphlets, or other written materials from your doctor or pharmacy?: 1 - Never  Interpreter Needed?: No  Information entered by :: Rayli Wiederhold,CMA   Activities of Daily Living     11/10/2023    6:46 AM 09/03/2023   12:50 PM  In your present state of health, do you have any difficulty performing the following activities:  Hearing? 1  0  Vision? 0  0  Difficulty concentrating or making decisions? 0  0  Walking or climbing stairs? 1    Dressing or bathing? 0    Doing errands, shopping? 0  0  Preparing Food and eating ? N    Using the Toilet? N    In the past six months, have you accidently leaked urine? Y    Do you have problems with loss of bowel control? N    Managing your Medications? N    Managing your Finances? N    Housekeeping or managing your Housekeeping? N       Proxy-reported    Patient Care Team: Watt Mirza, MD as PCP - General (Family Medicine) Mittie Gaskin, MD as Referring Physician (Ophthalmology) Fernand Denyse LABOR, MD as Consulting Physician (Cardiology) Gwenn Kent, MD (Inactive) as Consulting Physician (Rehabilitation) Eilleen Richerd GRADE, RN as [facility] Care Management  I have updated your Care Teams any recent Medical Services you may have received from other providers in the past year.     Assessment:   This is a routine wellness examination for Cristine.  Hearing/Vision screen Hearing Screening - Comments:: Patient wears hearing  aids Vision Screening - Comments:: Wear glasses   Goals Addressed             This Visit's Progress    DIET - EAT MORE FRUITS AND VEGETABLES   On track      Depression Screen     11/17/2023    9:03 AM 09/25/2023   10:36 AM 11/11/2022    8:51 AM 11/06/2021    9:08 AM 11/03/2020    9:10 AM 11/03/2019    9:06 AM 11/02/2018    9:23 AM  PHQ 2/9 Scores  PHQ - 2 Score 0 0 0 0 0 0 0  PHQ- 9 Score 0   0 0 0     Fall Risk     11/10/2023    6:46 AM 11/11/2022    8:53 AM 11/06/2021    9:11 AM 11/03/2020    9:10 AM 11/03/2019    9:06 AM  Fall Risk   Falls in the past year? 0  0 1 0 0  Number falls in past yr: 0  0 0 0 0  Injury with Fall? 0 0 1 0 0  Risk for fall due to : No Fall Risks No Fall Risks History of fall(s) Medication side effect Medication side effect  Follow up Falls evaluation completed Falls prevention discussed;Falls evaluation completed Falls prevention discussed;Falls evaluation completed  Falls evaluation completed;Falls prevention discussed  Falls evaluation completed;Falls prevention discussed      Proxy-reported   Data saved with a previous flowsheet row definition    MEDICARE RISK AT HOME:  Medicare Risk at Home Any stairs in or around the home?: (Proxy-Rptd) Yes If so, are there any without handrails?: (Proxy-Rptd) No Home free of loose throw rugs in walkways, pet beds, electrical cords, etc?: (Proxy-Rptd) Yes Adequate lighting in your home to reduce risk of falls?: (Proxy-Rptd) Yes Life alert?: (Proxy-Rptd) No Use of a cane,  walker or w/c?: (Proxy-Rptd) No Grab bars in the bathroom?: (Proxy-Rptd) Yes Shower chair or bench in shower?: (Proxy-Rptd) No Elevated toilet seat or a handicapped toilet?: (Proxy-Rptd) Yes  TIMED UP AND GO:  Was the test performed?  No  Cognitive Function: 6CIT completed    11/03/2020    9:12 AM 11/03/2019    9:08 AM 10/10/2017    8:52 AM 10/03/2016   10:16 AM 09/12/2015   11:00 AM  MMSE - Mini Mental State Exam  Orientation to time 5 5 5 5  5    Orientation to Place 5 5 5 5  5    Registration 3 3 3 3  3    Attention/ Calculation 5 5 0 0  0   Recall 3 3 3 3  3    Language- name 2 objects   0 0  0   Language- repeat 1 1 1 1 1   Language- follow 3 step command   3 3  3    Language- read & follow direction   0 0  0   Write a sentence   0 0  0   Copy design   0 0  0   Total score   20 20  20       Data saved with a previous flowsheet row definition        11/17/2023    9:01 AM 11/11/2022    8:56 AM  6CIT Screen  What Year? 0 points 0 points  What month? 0 points 0 points  What time? 0 points 0 points  Count back from 20 0 points  0 points  Months in reverse 0 points 0 points  Repeat phrase 0 points 0 points  Total Score 0 points 0 points    Immunizations Immunization History  Administered Date(s) Administered   Fluad Quad(high Dose 65+) 02/16/2019   Fluad Trivalent(High Dose 65+) 02/16/2023   Influenza,inj,Quad PF,6+ Mos 04/12/2013, 03/10/2014, 03/29/2015, 03/05/2016, 03/06/2017, 02/26/2018   Moderna Covid-19 Fall Seasonal Vaccine 52yrs & older 02/02/2022   Moderna SARS-COV2 Booster Vaccination 04/30/2020, 12/27/2020   PFIZER(Purple Top)SARS-COV-2 Vaccination 05/22/2019, 06/12/2019   Pneumococcal Conjugate-13 09/12/2015   Pneumococcal Polysaccharide-23 04/12/2013   Respiratory Syncytial Virus Vaccine,Recomb Aduvanted(Arexvy) 04/20/2022    Screening Tests Health Maintenance  Topic Date Due   DTaP/Tdap/Td (1 - Tdap) Never done   Zoster Vaccines- Shingrix (1 of 2) Never  done   COVID-19 Vaccine (4 - 2024-25 season) 12/29/2022   INFLUENZA VACCINE  11/28/2023   Medicare Annual Wellness (AWV)  11/16/2024   Pneumococcal Vaccine: 50+ Years  Completed   DEXA SCAN  Completed   Hepatitis B Vaccines  Aged Out   HPV VACCINES  Aged Out   Meningococcal B Vaccine  Aged Out    Health Maintenance  Health Maintenance Due  Topic Date Due   DTaP/Tdap/Td (1 - Tdap) Never done   Zoster Vaccines- Shingrix (1 of 2) Never done   COVID-19 Vaccine (4 - 2024-25 season) 12/29/2022   Health Maintenance Items Addressed:patient declined vacciantions  Additional Screening:  Vision Screening: Recommended annual ophthalmology exams for early detection of glaucoma and other disorders of the eye. Would you like a referral to an eye doctor? No    Dental Screening: Recommended annual dental exams for proper oral hygiene  Community Resource Referral / Chronic Care Management: CRR required this visit?  No   CCM required this visit?  No   Plan:    I have personally reviewed and noted the following in the patient's chart:   Medical and social history Use of alcohol, tobacco or illicit drugs  Current medications and supplements including opioid prescriptions. Patient is not currently taking opioid prescriptions. Functional ability and status Nutritional status Physical activity Advanced directives List of other physicians Hospitalizations, surgeries, and ER visits in previous 12 months Vitals Screenings to include cognitive, depression, and falls Referrals and appointments  In addition, I have reviewed and discussed with patient certain preventive protocols, quality metrics, and best practice recommendations. A written personalized care plan for preventive services as well as general preventive health recommendations were provided to patient.   Lyle MARLA Right, [place]   11/17/2023   After Visit Summary: (MyChart) Due to this being a telephonic visit, the after visit  summary with patients personalized plan was offered to patient via MyChart   Notes: Nothing significant to report at this time.

## 2023-11-17 NOTE — Patient Instructions (Signed)
 Ms. Bains , Thank you for taking time out of your busy schedule to complete your Annual Wellness Visit with me. I enjoyed our conversation and look forward to speaking with you again next year. I, as well as your care team,  appreciate your ongoing commitment to your health goals. Please review the following plan we discussed and let me know if I can assist you in the future. Your Game plan/ To Do List    Referrals: If you haven't heard from the office you've been referred to, please reach out to them at the phone provided.  none Follow up Visits: /Next Medicare AWV with our clinical staff: 11/17/2024   Have you seen your provider in the last 6 months (3 months if uncontrolled diabetes)? Yes /Next Office Visit with your provider: n/a  Clinician Recommendations:  Am for 30 minutes of exercise or brisk walking, 6-8 glasses of water, and 5 servings of fruits and vegetables each day.       This is a list of the screening recommended for you and due dates:  Health Maintenance  Topic Date Due   DTaP/Tdap/Td vaccine (1 - Tdap) Never done   Zoster (Shingles) Vaccine (1 of 2) Never done   COVID-19 Vaccine (4 - 2024-25 season) 12/29/2022   Flu Shot  11/28/2023   Medicare Annual Wellness Visit  11/16/2024   Pneumococcal Vaccine for age over 29  Completed   DEXA scan (bone density measurement)  Completed   Hepatitis B Vaccine  Aged Out   HPV Vaccine  Aged Out   Meningitis B Vaccine  Aged Out    Advanced directives: (In Chart) A copy of your advanced directives are scanned into your chart should your provider ever need it. Advance Care Planning is important because it:  [x]  Makes sure you receive the medical care that is consistent with your values, goals, and preferences  [x]  It provides guidance to your family and loved ones and reduces their decisional burden about whether or not they are making the right decisions based on your wishes.  Follow the link provided in your after visit  summary or read over the paperwork we have mailed to you to help you started getting your Advance Directives in place. If you need assistance in completing these, please reach out to us  so that we can help you!  See attachments for Preventive Care and Fall Prevention Tips.

## 2023-11-19 ENCOUNTER — Other Ambulatory Visit (INDEPENDENT_AMBULATORY_CARE_PROVIDER_SITE_OTHER)

## 2023-11-19 ENCOUNTER — Ambulatory Visit: Payer: Self-pay | Admitting: Family Medicine

## 2023-11-19 DIAGNOSIS — L97311 Non-pressure chronic ulcer of right ankle limited to breakdown of skin: Secondary | ICD-10-CM

## 2023-11-19 DIAGNOSIS — I10 Essential (primary) hypertension: Secondary | ICD-10-CM | POA: Diagnosis not present

## 2023-11-19 DIAGNOSIS — R7303 Prediabetes: Secondary | ICD-10-CM

## 2023-11-19 DIAGNOSIS — I2510 Atherosclerotic heart disease of native coronary artery without angina pectoris: Secondary | ICD-10-CM | POA: Diagnosis not present

## 2023-11-19 DIAGNOSIS — I83013 Varicose veins of right lower extremity with ulcer of ankle: Secondary | ICD-10-CM | POA: Diagnosis not present

## 2023-11-19 DIAGNOSIS — R578 Other shock: Secondary | ICD-10-CM

## 2023-11-19 DIAGNOSIS — E782 Mixed hyperlipidemia: Secondary | ICD-10-CM

## 2023-11-19 DIAGNOSIS — D500 Iron deficiency anemia secondary to blood loss (chronic): Secondary | ICD-10-CM

## 2023-11-19 LAB — CBC WITH DIFFERENTIAL/PLATELET
Basophils Absolute: 0 K/uL (ref 0.0–0.1)
Basophils Relative: 0.8 % (ref 0.0–3.0)
Eosinophils Absolute: 0 K/uL (ref 0.0–0.7)
Eosinophils Relative: 0.8 % (ref 0.0–5.0)
HCT: 43.1 % (ref 36.0–46.0)
Hemoglobin: 14.1 g/dL (ref 12.0–15.0)
Lymphocytes Relative: 11.2 % — ABNORMAL LOW (ref 12.0–46.0)
Lymphs Abs: 0.6 K/uL — ABNORMAL LOW (ref 0.7–4.0)
MCHC: 32.7 g/dL (ref 30.0–36.0)
MCV: 93.8 fl (ref 78.0–100.0)
Monocytes Absolute: 0.5 K/uL (ref 0.1–1.0)
Monocytes Relative: 9.3 % (ref 3.0–12.0)
Neutro Abs: 4.1 K/uL (ref 1.4–7.7)
Neutrophils Relative %: 77.9 % — ABNORMAL HIGH (ref 43.0–77.0)
Platelets: 242 K/uL (ref 150.0–400.0)
RBC: 4.59 Mil/uL (ref 3.87–5.11)
RDW: 14 % (ref 11.5–15.5)
WBC: 5.3 K/uL (ref 4.0–10.5)

## 2023-11-19 LAB — BASIC METABOLIC PANEL WITH GFR
BUN: 20 mg/dL (ref 6–23)
CO2: 29 meq/L (ref 19–32)
Calcium: 9.3 mg/dL (ref 8.4–10.5)
Chloride: 101 meq/L (ref 96–112)
Creatinine, Ser: 0.71 mg/dL (ref 0.40–1.20)
GFR: 75.4 mL/min (ref >60.00)
Glucose, Bld: 98 mg/dL (ref 70–99)
Potassium: 4.5 meq/L (ref 3.5–5.1)
Sodium: 137 meq/L (ref 135–145)

## 2023-11-19 LAB — HEPATIC FUNCTION PANEL
ALT: 27 U/L (ref 0–35)
AST: 31 U/L (ref 0–37)
Albumin: 4.5 g/dL (ref 3.5–5.2)
Alkaline Phosphatase: 83 U/L (ref 39–117)
Bilirubin, Direct: 0.1 mg/dL (ref 0.0–0.3)
Total Bilirubin: 0.6 mg/dL (ref 0.2–1.2)
Total Protein: 6.4 g/dL (ref 6.0–8.3)

## 2023-11-19 LAB — IBC + FERRITIN
Ferritin: 40.5 ng/mL (ref 10.0–291.0)
Iron: 92 ug/dL (ref 42–145)
Saturation Ratios: 27.4 % (ref 20.0–50.0)
TIBC: 336 ug/dL (ref 250.0–450.0)
Transferrin: 240 mg/dL (ref 212.0–360.0)

## 2023-11-19 LAB — LIPID PANEL
Cholesterol: 183 mg/dL (ref 0–200)
HDL: 81.5 mg/dL (ref >39.00)
LDL Cholesterol: 85 mg/dL (ref 0–99)
NonHDL: 101.18
Total CHOL/HDL Ratio: 2
Triglycerides: 82 mg/dL (ref 0.0–149.0)
VLDL: 16.4 mg/dL (ref 0.0–40.0)

## 2023-11-19 LAB — HEMOGLOBIN A1C: Hgb A1c MFr Bld: 5.8 % (ref 4.6–6.5)

## 2023-11-19 NOTE — Addendum Note (Signed)
 Addended by: HOPE VEVA PARAS on: 11/19/2023 09:07 AM   Modules accepted: Orders

## 2023-11-19 NOTE — Addendum Note (Signed)
 Addended by: HOPE VEVA PARAS on: 11/19/2023 09:01 AM   Modules accepted: Orders

## 2023-11-20 LAB — URINALYSIS, ROUTINE W REFLEX MICROSCOPIC
Bilirubin Urine: NEGATIVE
Hgb urine dipstick: NEGATIVE
Ketones, ur: NEGATIVE
Leukocytes,Ua: NEGATIVE
Nitrite: NEGATIVE
RBC / HPF: NONE SEEN (ref 0–?)
Specific Gravity, Urine: 1.02 (ref 1.000–1.030)
Total Protein, Urine: NEGATIVE
Urine Glucose: NEGATIVE
Urobilinogen, UA: 0.2 (ref 0.0–1.0)
pH: 5.5 (ref 5.0–8.0)

## 2024-01-11 DIAGNOSIS — I8310 Varicose veins of unspecified lower extremity with inflammation: Secondary | ICD-10-CM | POA: Insufficient documentation

## 2024-01-11 DIAGNOSIS — I872 Venous insufficiency (chronic) (peripheral): Secondary | ICD-10-CM | POA: Insufficient documentation

## 2024-01-11 NOTE — Progress Notes (Signed)
 MRN : [id number]  Anne Ferrell is a 89 y.o. (03-16-1933) female who presents with chief complaint of legs hurt and swell.  History of Present Illness:   Patient returns to the office for follow-up regarding her venous ulcer. She denies pain.  She notes her wound seems to be healed. No further bleeding episodes no fever or chills   No outpatient medications have been marked as taking for the 01/12/24 encounter (Appointment) with Jama, Cordella MATSU, MD.    Past Medical History:  Diagnosis Date   Coronary artery disease involving native coronary artery of native heart without angina pectoris 09/14/2023   Diverticulitis    Glaucoma    High blood pressure    Osteoporosis 10/05/2015   Vitamin D  deficiency 09/18/2015    Past Surgical History:  Procedure Laterality Date   ABDOMINAL HYSTERECTOMY  2008   CATARACT EXTRACTION W/PHACO Right 02/27/2022   Procedure: KERMIT DUAL BLADE GONIOTOMY RIGHT;  Surgeon: Mittie Gaskin, MD;  Location: [facility];  Service: Ophthalmology;  Laterality: Right;   cataract surgery Bilateral 2011   CLOSED REDUCTION WRIST FRACTURE Right 12/23/2020   Procedure: CLOSED REDUCTION AND PERCUTANEOUS PINNING RIGHT WRIST;  Surgeon: Cleotilde Barrio, MD;  Location: [facility];  Service: Orthopedics;  Laterality: Right;   HIP ARTHROPLASTY Right 12/23/2020   Procedure: ARTHROPLASTY BIPOLAR HIP (HEMIARTHROPLASTY);  Surgeon: Cleotilde Barrio, MD;  Location: [facility];  Service: Orthopedics;  Laterality: Right;   LEFT HEART CATH AND CORONARY ANGIOGRAPHY N/A 09/04/2023   Procedure: LEFT HEART CATH AND CORONARY ANGIOGRAPHY;  Surgeon: Florencio Cara BIRCH, MD;  Location: [facility];  Service: Cardiovascular;  Laterality: N/A;   ORIF ELBOW FRACTURE Right 12/23/2020   Procedure: OPEN REDUCTION INTERNAL FIXATION (ORIF) ELBOW/OLECRANON FRACTURE;  Surgeon: Cleotilde Barrio, MD;  Location: [facility];  Service: Orthopedics;  Laterality: Right;   TONSILLECTOMY AND  ADENOIDECTOMY  1941    Social History Social History   Tobacco Use   Smoking status: Never   Smokeless tobacco: Never  Vaping Use   Vaping status: Never Used  Substance Use Topics   Alcohol use: Yes    Alcohol/week: 7.0 standard drinks of alcohol    Types: 7 Glasses of wine per week   Drug use: No    Family History Family History  Problem Relation Age of Onset   Cancer Father        prostate   Colon cancer Neg Hx     Allergies  Allergen Reactions   Codeine     Double Vision   Oxycodone -Acetaminophen  Other (See Comments)    Occular Migraine  acetaminophen  / oxycodone    Timolol Other (See Comments)    Pt states her heart stopped.    Anise Flavoring Agent (Non-Screening) Palpitations and Other (See Comments)    Feels like she is going to pass out   Ciprofloxacin Palpitations   Levofloxacin Palpitations   Licorice Flavor [Flavoring Agent (Non-Screening)] Palpitations     REVIEW OF SYSTEMS (Negative unless checked)  Constitutional: [] Weight loss  [] Fever  [] Chills Cardiac: [] Chest pain   [] Chest pressure   [] Palpitations   [] Shortness of breath when laying flat   [] Shortness of breath with exertion. Vascular:  [] Pain in legs with walking   [x] Pain in legs at rest  [] History of DVT   [] Phlebitis   [x] Swelling in legs   [] Varicose veins   [] Non-healing ulcers Pulmonary:   [] Uses home oxygen   [] Productive cough   [] Hemoptysis   [] Wheeze  []   COPD   [] Asthma Neurologic:  [] Dizziness   [] Seizures   [] History of stroke   [] History of TIA  [] Aphasia   [] Vissual changes   [] Weakness or numbness in arm   [] Weakness or numbness in leg Musculoskeletal:   [] Joint swelling   [] Joint pain   [] Low back pain Hematologic:  [] Easy bruising  [] Easy bleeding   [] Hypercoagulable state   [] Anemic Gastrointestinal:  [] Diarrhea   [] Vomiting  [] Gastroesophageal reflux/heartburn   [] Difficulty swallowing. Genitourinary:  [] Chronic kidney disease   [] Difficult urination  [] Frequent urination    [] Blood in urine Skin:  [] Rashes   [] Ulcers  Psychological:  [] History of anxiety   []  History of major depression.  Physical Examination  There were no vitals filed for this visit. There is no height or weight on file to calculate BMI. Gen: WD/WN, NAD Head: Mertens/AT, No temporalis wasting.  Ear/Nose/Throat: Hearing grossly intact, nares w/o erythema or drainage, pinna without lesions Eyes: PER, EOMI, sclera nonicteric.  Neck: Supple, no gross masses.  No JVD.  Pulmonary:  Good air movement, no audible wheezing, no use of accessory muscles.  Cardiac: RRR, precordium not hyperdynamic. Vascular: The venous ulcer is now completely healed scattered varicosities present bilaterally.  Moderate venous stasis changes to the legs bilaterally.  2+ soft pitting edema. CEAP [id number]   Vessel Right Left  Radial Palpable Palpable  Gastrointestinal: soft, non-distended. No guarding/no peritoneal signs.  Musculoskeletal: M/S 5/5 throughout.  No deformity.  Neurologic: CN 2-12 intact. Pain and light touch intact in extremities.  Symmetrical.  Speech is fluent. Motor exam as listed above. Psychiatric: Judgment intact, Mood & affect appropriate for pt's clinical situation. Dermatologic: Venous rashes no ulcers noted.  No changes consistent with cellulitis. Lymph : No lichenification or skin changes of chronic lymphedema.  CBC Lab Results  Component Value Date   WBC 5.3 11/19/2023   HGB 14.1 11/19/2023   HCT 43.1 11/19/2023   MCV 93.8 11/19/2023   PLT 242.0 11/19/2023    BMET    Component Value Date/Time   NA 137 11/19/2023 0848   K 4.5 11/19/2023 0848   CL 101 11/19/2023 0848   CO2 29 11/19/2023 0848   GLUCOSE 98 11/19/2023 0848   BUN 20 11/19/2023 0848   CREATININE 0.71 11/19/2023 0848   CALCIUM  9.3 11/19/2023 0848   GFRNONAA >60 09/05/2023 0516   GFRAA >60 02/24/2015 1836   CrCl cannot be calculated (Patient's most recent lab result is older than the maximum 21 days  allowed.).  COAG Lab Results  Component Value Date   INR 1.1 09/03/2023   INR 1.3 (H) 09/02/2023   INR 1.1 12/22/2020    Radiology No results found.   Assessment/Plan 1. Varicose veins with inflammation (Primary) Recommend:  The patient is complaining of varicose veins.    I have had a long discussion with the patient regarding  varicose veins and why they cause symptoms.  Patient will begin wearing graduated compression stockings on a daily basis, beginning first thing in the morning and removing them in the evening. The patient is instructed specifically not to sleep in the stockings.    The patient  will also begin using over-the-counter analgesics such as Motrin 600 mg po TID to help control the symptoms as needed.    In addition, behavioral modification including elevation during the day will be initiated, utilizing a recliner was recommended.  The patient is also instructed to continue exercising such as walking 4-5 times per week.  At this time the  patient wishes to continue conservative therapy and is not interested in more invasive treatments such as laser ablation and sclerotherapy.  The Patient will follow up PRN if the symptoms worsen.  2. Chronic venous insufficiency Recommend:  The patient is complaining of varicose veins.    I have had a long discussion with the patient regarding  varicose veins and why they cause symptoms.  Patient will begin wearing graduated compression stockings on a daily basis, beginning first thing in the morning and removing them in the evening. The patient is instructed specifically not to sleep in the stockings.    The patient  will also begin using over-the-counter analgesics such as Motrin 600 mg po TID to help control the symptoms as needed.    In addition, behavioral modification including elevation during the day will be initiated, utilizing a recliner was recommended.  The patient is also instructed to continue exercising such as  walking 4-5 times per week.  At this time the patient wishes to continue conservative therapy and is not interested in more invasive treatments such as laser ablation and sclerotherapy.  The Patient will follow up PRN if the symptoms worsen.  3. Essential hypertension Continue antihypertensive medications as already ordered, these medications have been reviewed and there are no changes at this time.  4. Coronary artery disease involving native coronary artery of native heart without angina pectoris Continue cardiac and antihypertensive medications as already ordered and reviewed, no changes at this time.  Continue statin as ordered and reviewed, no changes at this time  Nitrates PRN for chest pain  5. Mixed hyperlipidemia Continue statin as ordered and reviewed, no changes at this time    Cordella Shawl, MD  01/11/2024 4:19 PM

## 2024-01-12 ENCOUNTER — Ambulatory Visit (INDEPENDENT_AMBULATORY_CARE_PROVIDER_SITE_OTHER): Admitting: Vascular Surgery

## 2024-01-12 ENCOUNTER — Encounter (INDEPENDENT_AMBULATORY_CARE_PROVIDER_SITE_OTHER): Payer: Self-pay | Admitting: Vascular Surgery

## 2024-01-12 VITALS — BP 125/73 | HR 83 | Ht 69.5 in | Wt 121.0 lb

## 2024-01-12 DIAGNOSIS — I872 Venous insufficiency (chronic) (peripheral): Secondary | ICD-10-CM

## 2024-01-12 DIAGNOSIS — I10 Essential (primary) hypertension: Secondary | ICD-10-CM | POA: Diagnosis not present

## 2024-01-12 DIAGNOSIS — E782 Mixed hyperlipidemia: Secondary | ICD-10-CM | POA: Diagnosis not present

## 2024-01-12 DIAGNOSIS — I8310 Varicose veins of unspecified lower extremity with inflammation: Secondary | ICD-10-CM | POA: Diagnosis not present

## 2024-01-12 DIAGNOSIS — I2510 Atherosclerotic heart disease of native coronary artery without angina pectoris: Secondary | ICD-10-CM | POA: Diagnosis not present

## 2024-01-24 ENCOUNTER — Encounter (INDEPENDENT_AMBULATORY_CARE_PROVIDER_SITE_OTHER): Payer: Self-pay | Admitting: Vascular Surgery

## 2024-05-28 ENCOUNTER — Emergency Department
Admission: EM | Admit: 2024-05-28 | Discharge: 2024-05-28 | Disposition: A | Discharge status: Home / Self Care | Attending: Emergency Medicine | Admitting: Emergency Medicine

## 2024-05-28 ENCOUNTER — Other Ambulatory Visit: Payer: Self-pay

## 2024-05-28 ENCOUNTER — Telehealth (INDEPENDENT_AMBULATORY_CARE_PROVIDER_SITE_OTHER): Payer: Self-pay

## 2024-05-28 ENCOUNTER — Emergency Department

## 2024-05-28 DIAGNOSIS — I8391 Asymptomatic varicose veins of right lower extremity: Secondary | ICD-10-CM | POA: Insufficient documentation

## 2024-05-28 DIAGNOSIS — M79671 Pain in right foot: Secondary | ICD-10-CM | POA: Diagnosis present

## 2024-05-28 DIAGNOSIS — I8390 Asymptomatic varicose veins of unspecified lower extremity: Secondary | ICD-10-CM

## 2024-05-28 LAB — BASIC METABOLIC PANEL WITH GFR
Anion gap: 10 (ref 5–15)
BUN: 21 mg/dL (ref 8–23)
CO2: 24 mmol/L (ref 22–32)
Calcium: 9.3 mg/dL (ref 8.9–10.3)
Chloride: 99 mmol/L (ref 98–111)
Creatinine, Ser: 0.67 mg/dL (ref 0.44–1.00)
GFR, Estimated: >60 mL/min
Glucose, Bld: 108 mg/dL — ABNORMAL HIGH (ref 70–99)
Potassium: 4.4 mmol/L (ref 3.5–5.1)
Sodium: 134 mmol/L — ABNORMAL LOW (ref 135–145)

## 2024-05-28 LAB — CBC WITH DIFFERENTIAL/PLATELET
Abs Immature Granulocytes: 0.02 10*3/uL (ref 0.00–0.07)
Basophils Absolute: 0.1 10*3/uL (ref 0.0–0.1)
Basophils Relative: 1 %
Eosinophils Absolute: 0.1 10*3/uL (ref 0.0–0.5)
Eosinophils Relative: 1 %
HCT: 41.4 % (ref 36.0–46.0)
Hemoglobin: 13.6 g/dL (ref 12.0–15.0)
Immature Granulocytes: 0 %
Lymphocytes Relative: 9 %
Lymphs Abs: 0.7 10*3/uL (ref 0.7–4.0)
MCH: 31.2 pg (ref 26.0–34.0)
MCHC: 32.9 g/dL (ref 30.0–36.0)
MCV: 95 fL (ref 80.0–100.0)
Monocytes Absolute: 0.8 10*3/uL (ref 0.1–1.0)
Monocytes Relative: 11 %
Neutro Abs: 6.2 10*3/uL (ref 1.7–7.7)
Neutrophils Relative %: 78 %
Platelets: 250 10*3/uL (ref 150–400)
RBC: 4.36 MIL/uL (ref 3.87–5.11)
RDW: 12.3 % (ref 11.5–15.5)
WBC: 8 10*3/uL (ref 4.0–10.5)
nRBC: 0 % (ref 0.0–0.2)

## 2024-05-28 NOTE — Telephone Encounter (Signed)
 Patients daughter called into nurse line to express mom has pain in her lower legs that are warm to the touch she states that her legs are dark purple/bluish and she thinks she needs to be seen by someone in office today. Advised her that with the schedule that is not possible today and she may take her to the ED or urgent care to be seen. Daughter in agreement no further questions at this time. NP advised

## 2024-05-28 NOTE — ED Provider Triage Note (Signed)
 Emergency Medicine Provider Triage Evaluation Note  Anne Ferrell , a 89 y.o. female  was evaluated in triage.  Pt complains of right leg pain and an area where she had a venous stasis ulcer before.  Swollen veins.  Bottom of foot turning purple..  Review of Systems  Positive:  Negative:   Physical Exam  BP (!) 175/87   Pulse 78   Temp 98 F (36.7 C) (Oral)   Resp 18   Ht 5' 9 (1.753 m)   Wt 54.4 kg   SpO2 97%   BMI 17.72 kg/m  Gen:   Awake, no distress   Resp:  Normal effort  MSK:   Moves extremities without difficulty  Other:    Medical Decision Making  Medically screening exam initiated at 4:02 PM.  Appropriate orders placed.  Jacqueleen Kalmar was informed that the remainder of the evaluation will be completed by another provider, this initial triage assessment does not replace that evaluation, and the importance of remaining in the ED until their evaluation is complete.     Gasper Devere ORN, PA-C 05/28/24 [phone]

## 2024-05-28 NOTE — ED Triage Notes (Signed)
 Pt to ED for R foot pain. Pt has hx of venous ulcers on R leg. Pt reports new veins and increased purple color to bottom of foot. PT pulses palpable and cap refill present at this time. Pt Reports noticing the the pain starting last night.

## 2024-05-28 NOTE — ED Provider Notes (Signed)
 "  [facility] Provider Note    Event Date/Time   First MD Initiated Contact with Patient 05/28/24 1700     (approximate)   History   Foot pain  HPI  Anne Ferrell is a 89 y.o. female who presents to the emergency department today because of concerns for right foot pain and varicose veins.  Patient does have history of varicose veins.  Family states that they have seen Dr. Jama for it in the past.  She has been using compression stockings. For over a month she feels like the varicose veins have gotten worse.  She then started having some pain on the medial aspect distal to her ankle.  She does state this feels better than it did when it started last night.  They were worried about potential blood clot.  The patient denies any trauma to that foot.     Physical Exam   Triage Vital Signs: ED Triage Vitals  Encounter Vitals Group     BP 05/28/24 1600 (!) 175/87     Girls Systolic BP Percentile --      Girls Diastolic BP Percentile --      Boys Systolic BP Percentile --      Boys Diastolic BP Percentile --      Pulse Rate 05/28/24 1600 78     Resp 05/28/24 1600 18     Temp 05/28/24 1600 98 F (36.7 C)     Temp Source 05/28/24 1600 Oral     SpO2 05/28/24 1600 97 %     Weight 05/28/24 1601 120 lb (54.4 kg)     Height 05/28/24 1601 5' 9 (1.753 m)     Head Circumference --      Peak Flow --      Pain Score 05/28/24 1606 2     Pain Loc --      Pain Education --      Exclude from Growth Chart --     Most recent vital signs: Vitals:   05/28/24 1600  BP: (!) 175/87  Pulse: 78  Resp: 18  Temp: 98 F (36.7 C)  SpO2: 97%   General: Awake, alert, oriented. CV:  Good peripheral perfusion.  Resp:  Normal effort.  Abd:  No distention.  Other:  Right lower extremity with varicose veins. No deformity. No tenderness.   ED Results / Procedures / Treatments   Labs (all labs ordered are listed, but only abnormal results are displayed) Labs  Reviewed  BASIC METABOLIC PANEL WITH GFR - Abnormal; Notable for the following components:      Result Value   Sodium 134 (*)    Glucose, Bld 108 (*)    All other components within normal limits  CBC WITH DIFFERENTIAL/PLATELET     EKG  None   RADIOLOGY I independently interpreted and visualized the RLE US . My interpretation: No DVT Radiology interpretation:  IMPRESSION:  1. No deep venous thrombosis in the right leg.      PROCEDURES:  Critical Care performed: No   MEDICATIONS ORDERED IN ED: Medications - No data to display   IMPRESSION / MDM / ASSESSMENT AND PLAN / ED COURSE  I reviewed the triage vital signs and the nursing notes.                              Differential diagnosis includes, but is not limited to, DVT, varicose veins, infection  Patient's presentation is most  consistent with acute presentation with potential threat to life or bodily function.  Patient presented to the emergency department today because of concerns for right foot pain.  On exam no deformity.  No erythema or warmth.  Patient does have varicose veins there.  Ultrasound did not show DVT.  At this time somewhat unclear etiology although I have low concern for clot fracture or infection.  Do think it would be reasonable for patient to be discharged.  Did encourage patient to follow-up with her vascular surgeon.  FINAL CLINICAL IMPRESSION(S) / ED DIAGNOSES   Final diagnoses:  Varicose veins of ankle      Note:  This document was prepared using Dragon voice recognition software and may include unintentional dictation errors.    Floy Roberts, MD 05/28/24 2116  "

## 2024-05-31 ENCOUNTER — Encounter: Payer: Self-pay | Admitting: Family Medicine

## 2024-06-28 ENCOUNTER — Encounter (INDEPENDENT_AMBULATORY_CARE_PROVIDER_SITE_OTHER)

## 2024-06-28 ENCOUNTER — Ambulatory Visit (INDEPENDENT_AMBULATORY_CARE_PROVIDER_SITE_OTHER): Admitting: Vascular Surgery

## 2024-11-17 ENCOUNTER — Ambulatory Visit
